# Patient Record
Sex: Female | Born: 1950 | Race: White | Hispanic: No | Marital: Married | Smoking: Former smoker
Health system: Southern US, Community
[De-identification: ages and names within clinical notes are randomized; demographics above are authoritative.]

## PROBLEM LIST (undated history)

## (undated) DIAGNOSIS — E785 Hyperlipidemia, unspecified: Secondary | ICD-10-CM

## (undated) DIAGNOSIS — K219 Gastro-esophageal reflux disease without esophagitis: Secondary | ICD-10-CM

## (undated) DIAGNOSIS — I10 Essential (primary) hypertension: Secondary | ICD-10-CM

## (undated) DIAGNOSIS — E119 Type 2 diabetes mellitus without complications: Secondary | ICD-10-CM

## (undated) DIAGNOSIS — M109 Gout, unspecified: Secondary | ICD-10-CM

## (undated) HISTORY — DX: Type 2 diabetes mellitus without complications: E11.9

---

## 2015-01-22 DIAGNOSIS — M1990 Unspecified osteoarthritis, unspecified site: Secondary | ICD-10-CM | POA: Insufficient documentation

## 2015-01-22 DIAGNOSIS — Z8739 Personal history of other diseases of the musculoskeletal system and connective tissue: Secondary | ICD-10-CM | POA: Insufficient documentation

## 2015-01-22 DIAGNOSIS — J4520 Mild intermittent asthma, uncomplicated: Secondary | ICD-10-CM | POA: Insufficient documentation

## 2015-02-26 DIAGNOSIS — D729 Disorder of white blood cells, unspecified: Secondary | ICD-10-CM | POA: Insufficient documentation

## 2015-12-16 ENCOUNTER — Encounter: Payer: Self-pay | Admitting: Internal Medicine

## 2015-12-16 ENCOUNTER — Ambulatory Visit (INDEPENDENT_AMBULATORY_CARE_PROVIDER_SITE_OTHER): Payer: 59 | Admitting: Internal Medicine

## 2015-12-16 ENCOUNTER — Other Ambulatory Visit (INDEPENDENT_AMBULATORY_CARE_PROVIDER_SITE_OTHER): Payer: 59

## 2015-12-16 ENCOUNTER — Encounter (INDEPENDENT_AMBULATORY_CARE_PROVIDER_SITE_OTHER): Payer: Self-pay

## 2015-12-16 VITALS — BP 110/76 | HR 78 | Wt 171.0 lb

## 2015-12-16 DIAGNOSIS — R05 Cough: Secondary | ICD-10-CM

## 2015-12-16 DIAGNOSIS — R059 Cough, unspecified: Secondary | ICD-10-CM

## 2015-12-16 DIAGNOSIS — R058 Other specified cough: Secondary | ICD-10-CM | POA: Insufficient documentation

## 2015-12-16 LAB — CBC WITH DIFFERENTIAL/PLATELET
Basophils Absolute: 0 K/uL (ref 0.0–0.1)
Basophils Relative: 0.3 % (ref 0.0–3.0)
Eosinophils Absolute: 0.3 K/uL (ref 0.0–0.7)
Eosinophils Relative: 1.7 % (ref 0.0–5.0)
HCT: 36 % (ref 36.0–46.0)
Hemoglobin: 12 g/dL (ref 12.0–15.0)
Lymphocytes Relative: 16.6 % (ref 12.0–46.0)
Lymphs Abs: 2.9 K/uL (ref 0.7–4.0)
MCHC: 33.4 g/dL (ref 30.0–36.0)
MCV: 89.2 fl (ref 78.0–100.0)
Monocytes Absolute: 1 K/uL (ref 0.1–1.0)
Monocytes Relative: 5.6 % (ref 3.0–12.0)
Neutro Abs: 13.3 K/uL — ABNORMAL HIGH (ref 1.4–7.7)
Neutrophils Relative %: 75.8 % (ref 43.0–77.0)
Platelets: 404 K/uL — ABNORMAL HIGH (ref 150.0–400.0)
RBC: 4.03 Mil/uL (ref 3.87–5.11)
RDW: 14.2 % (ref 11.5–15.5)
WBC: 17.5 K/uL — ABNORMAL HIGH (ref 4.0–10.5)

## 2015-12-16 LAB — NITRIC OXIDE: Nitric Oxide: 5

## 2015-12-16 MED ORDER — GABAPENTIN 100 MG PO CAPS: 100.0000 mg | ORAL_CAPSULE | Freq: Three times a day (TID) | ORAL | 2 refills | Status: DC

## 2015-12-16 MED ORDER — OMEPRAZOLE 20 MG PO CPDR: DELAYED_RELEASE_CAPSULE | ORAL | Status: DC

## 2015-12-16 MED ORDER — PANTOPRAZOLE SODIUM 40 MG PO TBEC: 40.0000 mg | DELAYED_RELEASE_TABLET | Freq: Every day | ORAL | 2 refills | Status: DC

## 2015-12-16 NOTE — Patient Instructions (Addendum)
Pantoprazole (protonix) 40 mg   Take  30-60 min before first meal of the day and  prilosec 20 mg x 2 x 30 min before supper  Stop Breo  Gabapentin 100 mg three times daily   GERD (REFLUX)  is an extremely common cause of respiratory symptoms just like yours , many times with no obvious heartburn at all.    It can be treated with medication, but also with lifestyle changes including elevation of the head of your bed (ideally with 6 inch  bed blocks),  Smoking cessation, avoidance of late meals, excessive alcohol, and avoid fatty foods, chocolate, peppermint, colas, red wine, and acidic juices such as orange juice.  NO MINT OR MENTHOL PRODUCTS SO NO COUGH DROPS   USE SUGARLESS CANDY INSTEAD (Jolley ranchers or Stover's or Life Savers) or even ice chips will also do - the key is to swallow to prevent all throat clearing. NO OIL BASED VITAMINS - use powdered substitutes.    Please remember to go to the lab   department downstairs for your tests - we will call you with the results when they are available.  Please schedule a follow up office visit in 4 weeks, sooner if needed

## 2015-12-16 NOTE — Progress Notes (Signed)
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 CAP LL with radiographic clearance but very little clinical correlation.   12/16/2015 1st [facility] office visit/ Sheila [name]   Chief Complaint  Patient presents with  . Pulmonary Consult    Referred by Dr. Sherril Croon. Pt states she was dxed with Asthma 5 yrs ago. She c/o cough for the past several yrs. Cough is prod at times with clear to white sputum.  Cough is esp in the am. Sometimes it wakes her up at night. Cough is triggered by strong smells.   severe cough esp immediately p stirring in am but not like an alarm clock  ? Some better afternoon unless talking then much worse Neb works better then saba hfa  Prednisone did not help at all Assoc with some sneezing neg resp to allegra/clariton/ zytec but minimal sense of pnds  No obvious other patterns in day to day or daytime variabilty or assoc sob unless coughing or cp or chest tightness, subjective wheeze overt sinus or hb symptoms. No unusual exp hx or h/o childhood pna/ asthma or knowledge of premature birth.  Sleeping ok without nocturnal  or early am exacerbation  of respiratory  c/o's or need for noct saba. Also denies any obvious fluctuation of symptoms with weather or environmental changes or other aggravating or alleviating factors except as outlined above   Current Medications, Allergies, Complete Past Medical History, Past Surgical History, Family History, and Social History were  reviewed in [employer] record.               Review of Systems  Constitutional: Negative for chills, fever and unexpected weight change.  HENT: Negative for congestion, dental problem, ear pain, nosebleeds, postnasal drip, rhinorrhea, sinus pressure, sneezing, sore throat, trouble swallowing and voice change.   Eyes: Negative for visual disturbance.  Respiratory: Positive for cough and shortness of breath. Negative for choking.   Cardiovascular: Negative for chest pain and leg swelling.  Gastrointestinal: Negative for abdominal pain, diarrhea and vomiting.  Genitourinary: Negative for difficulty urinating.  Musculoskeletal: Positive for arthralgias.  Skin: Negative for rash.  Neurological: Negative for tremors, syncope and headaches.  Hematological: Does not bruise/bleed easily.       Objective:   Physical Exam  amb wf with severe voice fatigue/ harsh cough and incessant throat clearing   Wt Readings from Last 3 Encounters:  12/16/15 171 lb (77.6 kg)    Vital signs reviewed    HEENT: nl dentition, turbinates, and oropharynx. Nl external ear canals without cough reflex   NECK :  without JVD/Nodes/TM/ nl carotid upstrokes bilaterally   LUNGS: no acc muscle use,  Nl contour chest which is clear to A and P bilaterally without reproducible  cough on insp or exp maneuvers   CV:  RRR  no s3 or murmur or increase in P2, no edema  ABD:  soft and nontender with nl inspiratory excursion in the supine position. No bruits or organomegaly, bowel sounds nl  MS:  Nl gait/ ext warm without deformities, calf tenderness, cyanosis or clubbing No obvious joint restrictions   SKIN: warm and dry without lesions    NEURO:  alert, approp, nl sensorium with  no motor deficits     I personally reviewed images and agree with radiology impression as follows:  CXR:   11/28/15 No pna/ no acute abnormality       Assessment & Plan:

## 2015-12-17 LAB — RESPIRATORY ALLERGY PROFILE REGION II ~~LOC~~
Allergen, A. alternata, m6: <0.1 kU/L
Allergen, Comm Silver Birch, t9: <0.1 kU/L
Allergen, D pternoyssinus,d7: 0.1 kU/L — ABNORMAL HIGH
Allergen, Mulberry, t76: <0.1 kU/L
Allergen, Oak,t7: <0.1 kU/L
Box Elder IgE: <0.1 kU/L
Cat Dander: <0.1 kU/L
Cockroach: 0.16 kU/L — ABNORMAL HIGH
D. farinae: <0.1 kU/L
Dog Dander: <0.1 kU/L
Elm IgE: <0.1 kU/L
IgE (Immunoglobulin E), Serum: 97 kU/L (ref <115)
Johnson Grass: <0.1 kU/L
Rough Pigweed  IgE: <0.1 kU/L
Sheep Sorrel IgE: <0.1 kU/L

## 2015-12-17 NOTE — Assessment & Plan Note (Addendum)
FENO 12/16/2015  =   5 - Spirometry 12/16/2015  wnl including fef 25-75 with active symptoms  - Allergy profile 12/16/2015 >  Eos 0.3 /  IgE  Pending    The most common causes of chronic cough in immunocompetent adults include the following: upper airway cough syndrome (UACS), previously referred to as postnasal drip syndrome (PNDS), which is caused by variety of rhinosinus conditions; (2) asthma; (3) GERD; (4) chronic bronchitis from cigarette smoking or other inhaled environmental irritants; (5) nonasthmatic eosinophilic bronchitis; and (6) bronchiectasis.   These conditions, singly or in combination, have accounted for up to 94% of the causes of chronic cough in prospective studies.   Other conditions have constituted no >6% of the causes in prospective studies These have included bronchogenic carcinoma, chronic interstitial pneumonia, sarcoidosis, left ventricular failure, ACEI-induced cough, and aspiration from a condition associated with pharyngeal dysfunction.    Chronic cough is often simultaneously caused by more than one condition. A single cause has been found from 38 to 82% of the time, multiple causes from 18 to 62%. Multiply caused cough has been the result of three diseases up to 42% of the time.       Based on hx and exam, this is most likely:  Probably not asthma at present but rather  Upper airway cough syndrome , so named because it's frequently impossible to sort out how much is  CR/sinusitis with freq throat clearing (which can be related to primary GERD)   vs  causing  secondary (" extra esophageal")  GERD from wide swings in gastric pressure that occur with throat clearing, often  promoting self use of mint and menthol lozenges that reduce the lower esophageal sphincter tone and exacerbate the problem further in a cyclical fashion.   These are the same pts (now being labeled as having "irritable larynx syndrome" by some cough centers) who not infrequently have a history of having  failed to tolerate ace inhibitors,  dry powder inhalers or biphosphonates or report having atypical reflux symptoms that don't respond to standard doses of PPI , and are easily confused as having aecopd or asthma flares by even experienced allergists/ pulmonologists.   The first step is to maximize acid suppression and eliminate cyclical coughing with gabapentin then regroup in 4 weeks  I had an extended discussion with the patient/husband reviewing all relevant studies completed to date and  lasting 35 min/60 min ov  1) Explained: The standardized cough guidelines published in Chest by [place] in 2006 are still the best available and consist of a multiple step process (up to 12!) , not a single office visit,  and are intended  to address this problem logically,  with an alogrithm dependent on response to empiric treatment at  each progressive step  to determine a specific diagnosis with  minimal addtional testing needed. Therefore if adherence is an issue or can't be accurately verified,  it's very unlikely the standard evaluation and treatment will be successful here.    Furthermore, response to therapy (other than acute cough suppression, which should only be used short term with avoidance of narcotic containing cough syrups if possible), can be a gradual process for which the patient may perceive immediate benefit.  Unlike going to an eye doctor where the best perscription is almost always the first one and is immediately effective, this is almost never the case in the management of chronic cough syndromes. Therefore the patient needs to commit up front to consistently adhere to  recommendations  for up to 6 weeks of therapy directed at the likely underlying problem(s) before the response can be reasonably evaluated.     2) Each maintenance medication was reviewed in detail including most importantly the difference between maintenance and prns and under what circumstances the prns are to be  triggered using an action plan format that is not reflected in the computer generated alphabetically organized AVS.    Please see instructions for details which were reviewed in writing and the patient given a copy highlighting the part that I personally wrote and discussed at today's ov.   See instructions for specific recommendations which were reviewed directly with the patient who was given a copy with highlighter outlining the key components.

## 2016-01-16 ENCOUNTER — Ambulatory Visit: Payer: 59 | Admitting: Internal Medicine

## 2016-02-02 ENCOUNTER — Ambulatory Visit (INDEPENDENT_AMBULATORY_CARE_PROVIDER_SITE_OTHER): Payer: 59 | Admitting: Internal Medicine

## 2016-02-02 ENCOUNTER — Encounter: Payer: Self-pay | Admitting: Internal Medicine

## 2016-02-02 VITALS — BP 116/80 | HR 63 | Ht 60.0 in | Wt 172.6 lb

## 2016-02-02 DIAGNOSIS — R05 Cough: Secondary | ICD-10-CM

## 2016-02-02 DIAGNOSIS — R058 Other specified cough: Secondary | ICD-10-CM

## 2016-02-02 NOTE — Progress Notes (Signed)
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 CAP LL with radiographic clearance but very little clinical correlation.    History of Present Illness  12/16/2015 1st [facility] office visit/ [facility]  Chief Complaint  Patient presents with  . Pulmonary Consult    Referred by Dr. Sherril Croon. Pt states she was dxed with Asthma 5 yrs ago. She c/o cough for the past several yrs. Cough is prod at times with clear to white sputum.  Cough is esp in the am. Sometimes it wakes her up at night. Cough is triggered by strong smells.   severe cough esp immediately p stirring in am but not like an alarm clock  ? Some better afternoon unless talking then much worse Neb works better than saba hfa  Prednisone did not help at all Assoc with some sneezing neg resp to allegra/clariton/ zytec but minimal sense of pnds rec Pantoprazole (protonix) 40 mg   Take  30-60 min before first meal of the day and  prilosec 20 mg x 2 x 30 min before supper Stop Breo Gabapentin 100 mg three times daily  GERD diet     02/02/2016  f/u ov/Samad Thon re: recurrent cough x singulair and max for Reflux / neurontin 100 mg tid / no need for saba  Chief Complaint  Patient presents with  . Follow-up    Pt. still has a slight dry cough, Pt. denies wheezing,chest pain,sob  hard rock candy helping  / back to work but hasn't been exposed to any of the typical triggers yet  Not limited by  breathing from desired activities    No obvious day to day or daytime variability or assoc excess/ purulent sputum or mucus plugs or hemoptysis or cp or chest tightness, subjective wheeze or overt sinus or hb symptoms. No unusual exp hx or h/o childhood pna/ asthma or knowledge of premature birth.  Sleeping ok without nocturnal  or early am exacerbation  of respiratory  c/o's or need for noct saba. Also denies any obvious fluctuation of symptoms with weather or environmental changes or other aggravating or alleviating factors except as outlined above   Current Medications, Allergies, Complete Past Medical History, Past Surgical History, Family History, and Social History were reviewed in [employer] record.  ROS  The following are not active complaints unless bolded sore throat, dysphagia, dental problems, itching, sneezing,  nasal congestion or excess/ purulent secretions, ear ache,   fever, chills, sweats, unintended wt loss, classically pleuritic or exertional cp,  orthopnea pnd or leg swelling, presyncope, palpitations, abdominal pain, anorexia, nausea, vomiting, diarrhea  or change in bowel or bladder habits, change in stools or urine, dysuria,hematuria,  rash, arthralgias, visual complaints, headache, numbness, weakness or ataxia or problems with walking or coordination,  change in mood/affect or memory.  Objective:   Physical Exam  amb wf  nad     Wt Readings from Last 3 Encounters:  02/02/16 172 lb 9.6 oz (78.3 kg)  12/16/15 171 lb (77.6 kg)    Vital signs reviewed     HEENT: nl dentition, turbinates, and oropharynx. Nl external ear canals without cough reflex   NECK :  without JVD/Nodes/TM/ nl carotid upstrokes bilaterally   LUNGS: no acc muscle use,  Nl contour chest which is clear to A and P bilaterally without reproducible  cough on insp or exp maneuvers   CV:  RRR  no s3 or murmur or increase in P2, no edema   ABD:  soft  and nontender with nl inspiratory excursion in the supine position. No bruits or organomegaly, bowel sounds nl  MS:  Nl gait/ ext warm without deformities, calf tenderness, cyanosis or clubbing No obvious joint restrictions   SKIN: warm and dry without lesions    NEURO:  alert, approp, nl sensorium with  no motor deficits     I personally reviewed images and agree with radiology impression as follows:  CXR:   11/28/15 No pna/ no acute abnormality       Assessment & Plan:

## 2016-02-02 NOTE — Patient Instructions (Addendum)
If cough flares, increase gabapentin to 300 mg three times daily.  For itching/sneezing drainage / throat tickle try take CHLORPHENIRAMINE  4 mg - take one every 4 hours as needed - available over the counter- may cause drowsiness so start with just a bedtime dose or two and see how you tolerate it before trying in daytime    Please schedule a follow up visit in 2 months but call sooner if needed

## 2016-02-09 NOTE — Assessment & Plan Note (Addendum)
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     For now reasonable to continue singulair/ gabapentin combination (the former certainly can't make cough worse, whereas BREO/ dpi's can potentially worsen cough)  for the next 2 months and see if can tolerate work exposures.  Methacholine challenge remains the next option if flares again.  In interim can use 1st gen h1 prn and increase the gabapentin to max of 300 tid if needed    I had an extended discussion with the patient/husband reviewing all relevant studies completed to date and  lasting 15 to 20 minutes of a 25 minute visit    Each maintenance medication was reviewed in detail including most importantly the difference between maintenance and prns and under what circumstances the prns are to be triggered using an action plan format that is not reflected in the computer generated alphabetically organized AVS.    Please see instructions for details which were reviewed in writing and the patient given a copy highlighting the part that I personally wrote and discussed at today's ov.   fo

## 2016-04-02 ENCOUNTER — Ambulatory Visit: Payer: 59 | Admitting: Internal Medicine

## 2017-06-06 DIAGNOSIS — M25579 Pain in unspecified ankle and joints of unspecified foot: Secondary | ICD-10-CM | POA: Insufficient documentation

## 2018-04-26 DIAGNOSIS — M7989 Other specified soft tissue disorders: Secondary | ICD-10-CM | POA: Diagnosis not present

## 2018-04-26 DIAGNOSIS — Z1331 Encounter for screening for depression: Secondary | ICD-10-CM | POA: Diagnosis not present

## 2018-04-26 DIAGNOSIS — Z1339 Encounter for screening examination for other mental health and behavioral disorders: Secondary | ICD-10-CM | POA: Diagnosis not present

## 2018-04-26 DIAGNOSIS — Z79899 Other long term (current) drug therapy: Secondary | ICD-10-CM | POA: Diagnosis not present

## 2018-04-26 DIAGNOSIS — Z1211 Encounter for screening for malignant neoplasm of colon: Secondary | ICD-10-CM | POA: Diagnosis not present

## 2018-04-26 DIAGNOSIS — I10 Essential (primary) hypertension: Secondary | ICD-10-CM | POA: Diagnosis not present

## 2018-04-26 DIAGNOSIS — R5383 Other fatigue: Secondary | ICD-10-CM | POA: Diagnosis not present

## 2018-04-26 DIAGNOSIS — Z0000 Encounter for general adult medical examination without abnormal findings: Secondary | ICD-10-CM | POA: Diagnosis not present

## 2018-04-26 DIAGNOSIS — Z6836 Body mass index (BMI) 36.0-36.9, adult: Secondary | ICD-10-CM | POA: Diagnosis not present

## 2018-04-26 DIAGNOSIS — E1165 Type 2 diabetes mellitus with hyperglycemia: Secondary | ICD-10-CM | POA: Diagnosis not present

## 2018-04-26 DIAGNOSIS — Z7189 Other specified counseling: Secondary | ICD-10-CM | POA: Diagnosis not present

## 2018-04-26 DIAGNOSIS — E7800 Pure hypercholesterolemia, unspecified: Secondary | ICD-10-CM | POA: Diagnosis not present

## 2018-04-26 DIAGNOSIS — Z299 Encounter for prophylactic measures, unspecified: Secondary | ICD-10-CM | POA: Diagnosis not present

## 2018-05-03 ENCOUNTER — Ambulatory Visit (INDEPENDENT_AMBULATORY_CARE_PROVIDER_SITE_OTHER): Payer: Medicare Other

## 2018-05-03 ENCOUNTER — Ambulatory Visit (INDEPENDENT_AMBULATORY_CARE_PROVIDER_SITE_OTHER): Payer: Medicare Other | Admitting: Podiatry

## 2018-05-03 ENCOUNTER — Encounter: Payer: Self-pay | Admitting: Podiatry

## 2018-05-03 VITALS — BP 154/89 | HR 84

## 2018-05-03 DIAGNOSIS — M19079 Primary osteoarthritis, unspecified ankle and foot: Secondary | ICD-10-CM

## 2018-05-03 DIAGNOSIS — M19071 Primary osteoarthritis, right ankle and foot: Secondary | ICD-10-CM | POA: Diagnosis not present

## 2018-05-03 DIAGNOSIS — M19072 Primary osteoarthritis, left ankle and foot: Secondary | ICD-10-CM | POA: Diagnosis not present

## 2018-05-03 NOTE — Progress Notes (Signed)
This patient presents to the office with chief complaint of painful feet due to previously diagnosed arthritis.  She says she has been seen by Dr. Victorino Dike who has attempted to treat her arthritis in the past.  He has treated her with medication and has taken multiple has taken multiple x-rays.  She states that he explained to her that her bones in her rear foot especially her right rear foot have moved.  This has caused her to have significant pain and swelling especially in her right foot by the end of the day.  She relates  having ligament surgery as a child to both feet.  She states her severe pain has been present over the last 5 years.  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  She says Dr.  Victorino Dike told her he was unable to provide her any relief and discharged her.  She now presents to the office for a second opinion on possible treatment for her painful feet.  This patient is also diabetic.    Vascular  Dorsalis pedis and posterior tibial pulses are palpable  B/L.  Capillary return  WNL.  Temperature gradient is  WNL.  Skin turgor  WNL  Sensorium  Senn Weinstein monofilament wire  WNL. Normal tactile sensation.  Nail Exam  Patient has normal nails with no evidence of bacterial or fungal infection.  Orthopedic  Exam  Examination of her left foot reveals limited ROM  STJ.  No swelling noted.  HAV 1st MPJ  B/L.  Pes planus foot type. Examination of right foot reveals no motion  STJ right foot.  HAV 1st MPJ right foot.  Severe pes planus foot type.  Skin  No open lesions.  Normal skin texture and turgor. Callus noted under 1st MCJ and 1st MPJ right foot.  Chronic arthropathy  Feet  B/L.  IE.  Xrays left foot reveal severe HAV deformity with separation of bones 1st MCJ.  There is calcification at the insertion achilles tendon left foot.  Midfoot arthritic changes left foot. Right foot X-ray reveals severe HAV deformity with plantarflexed talus right  rearfoot.  Osteophytes noted over talus dorsally.  The bone at the head first metatarsal has become arthritic.  STJ right foot is arthritic with osteophyes posteriorally.  Discussed this condition and x-rays with this patient.  I told her that she needs to be seen by Dr. Logan Bores for further work-up of her painful foot condition.  Discussed she may need bracing and/or surgery and Dr. Logan Bores will evaluate her and make that decision.  Patient states the Voltaren provided no relief when she took the Voltaren.  Therefore we decided against treating her with any medication.  Patient is to make an appointment with Dr. Logan Bores for further evaluation and treatment.  Helane Gunther DPM

## 2018-05-08 ENCOUNTER — Ambulatory Visit (INDEPENDENT_AMBULATORY_CARE_PROVIDER_SITE_OTHER): Payer: Medicare Other | Admitting: Podiatry

## 2018-05-08 DIAGNOSIS — M76829 Posterior tibial tendinitis, unspecified leg: Secondary | ICD-10-CM

## 2018-05-08 DIAGNOSIS — M19071 Primary osteoarthritis, right ankle and foot: Secondary | ICD-10-CM

## 2018-05-08 DIAGNOSIS — M2140 Flat foot [pes planus] (acquired), unspecified foot: Secondary | ICD-10-CM | POA: Diagnosis not present

## 2018-05-08 MED ORDER — CELECOXIB 100 MG PO CAPS: 100.0000 mg | ORAL_CAPSULE | Freq: Two times a day (BID) | ORAL | 2 refills | Status: DC

## 2018-05-08 NOTE — Progress Notes (Addendum)
   HPI: 68 year old female PMHx of T2DM presents the office today for evaluation of painful right foot.  Patient was last seen on 05/03/2018 at which time she was referred to me for additional work-up and evaluation.  Patient complains of right foot arch collapse that is been very painful for several years now.  Patient is a type II diabetic.  Her last hemoglobin A1c 7.2 mg/dL.  Patient states that conservative modalities have not helped to alleviate any of her symptoms.  Patient just recently retired this year and is not on her feet as much.  Rest does help to alleviate symptoms.  She presents for further treatment evaluation  Past Medical History:  Diagnosis Date  . DM (diabetes mellitus) (HCC)      Physical Exam: General: The patient is alert and oriented x3 in no acute distress.  Dermatology: Skin is warm, dry and supple bilateral lower extremities. Negative for open lesions or macerations.  Vascular: Palpable pedal pulses bilaterally. No edema or erythema noted. Capillary refill within normal limits.  Neurological: Epicritic and protective threshold diminished bilaterally.   Musculoskeletal Exam: Range of motion within normal limits to all pedal and ankle joints bilateral. Muscle strength 5/5 in all groups bilateral.  Significant end-stage posterior tibial tendon dysfunction with loss of motion to the subtalar joint, TN joint noted.  Degenerative arthritic changes also noted.  End-stage pes planus right lower extremity noted  Radiographic Exam:  Joint space narrowing with collapse of the medial longitudinal arch noted.  Exposure of the talar head noted with medial deviation of the talar head.  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  Assessment: 1.  Advanced PTTD with arthritic degenerative changes 2.  Pes planus right lower extremity 3.  Pain in right foot 4.  T2DM  Plan of Care:  1. Patient evaluated. X-Rays reviewed.  2.  Today we discussed surgical versus  conservative management regarding the patient's symptoms.  Surgery would consist of triple arthrodesis right foot.  I explained the benefits and disadvantages and postoperative recovery regarding surgery.  Patient would like to pursue conservative management for now. 3.  Prescription for Celebrex 200 mg twice daily 4.  Appointment with [facility] for diabetic shoes and insoles.  Patient does have a [place] brace that she is attempted to use in the past however it has not been very successful.  She is going to bring that to [facility] to see if it can be modified 5.  Return to clinic in 3 months      Felecia Shelling, DPM [facility]  Dr. Felecia Shelling, DPM    [address] [place], [place] [address]                Office [phone]  Fax [phone]

## 2018-05-09 DIAGNOSIS — Z23 Encounter for immunization: Secondary | ICD-10-CM | POA: Diagnosis not present

## 2018-05-11 DIAGNOSIS — Z1231 Encounter for screening mammogram for malignant neoplasm of breast: Secondary | ICD-10-CM | POA: Diagnosis not present

## 2018-05-15 ENCOUNTER — Ambulatory Visit: Payer: Medicare Other | Admitting: Orthotics

## 2018-05-15 DIAGNOSIS — M2140 Flat foot [pes planus] (acquired), unspecified foot: Secondary | ICD-10-CM

## 2018-05-15 DIAGNOSIS — M76829 Posterior tibial tendinitis, unspecified leg: Secondary | ICD-10-CM

## 2018-05-17 ENCOUNTER — Ambulatory Visit: Payer: Medicare Other | Admitting: Orthotics

## 2018-05-17 DIAGNOSIS — M76829 Posterior tibial tendinitis, unspecified leg: Secondary | ICD-10-CM

## 2018-05-17 DIAGNOSIS — M2140 Flat foot [pes planus] (acquired), unspecified foot: Secondary | ICD-10-CM

## 2018-05-17 DIAGNOSIS — M19071 Primary osteoarthritis, right ankle and foot: Secondary | ICD-10-CM

## 2018-05-17 NOTE — Progress Notes (Signed)
Patient presents today for evaluation/casting for AFO brace (r).   Patient has hx of the following conditions: Gait instability,  Ankle instabilty,  Gait analysis done and patient displays abnormality of gait in both sagittial and frontal planes, and could benefit in aggressive ankle support.  Patient chose [place] brace w/ lace/speed laces.  

## 2018-05-19 DIAGNOSIS — R5383 Other fatigue: Secondary | ICD-10-CM | POA: Diagnosis not present

## 2018-06-06 ENCOUNTER — Ambulatory Visit (INDEPENDENT_AMBULATORY_CARE_PROVIDER_SITE_OTHER): Payer: Medicare Other | Admitting: Orthotics

## 2018-06-06 DIAGNOSIS — M19071 Primary osteoarthritis, right ankle and foot: Secondary | ICD-10-CM | POA: Diagnosis not present

## 2018-06-06 DIAGNOSIS — E119 Type 2 diabetes mellitus without complications: Secondary | ICD-10-CM

## 2018-06-06 DIAGNOSIS — M76822 Posterior tibial tendinitis, left leg: Secondary | ICD-10-CM

## 2018-06-06 DIAGNOSIS — M76829 Posterior tibial tendinitis, unspecified leg: Secondary | ICD-10-CM

## 2018-06-06 DIAGNOSIS — M2142 Flat foot [pes planus] (acquired), left foot: Secondary | ICD-10-CM | POA: Diagnosis not present

## 2018-06-06 DIAGNOSIS — M2140 Flat foot [pes planus] (acquired), unspecified foot: Secondary | ICD-10-CM

## 2018-06-06 NOTE — Progress Notes (Signed)
Patient came in today to pick up standard Afo brace.  Patient was evaluated for fit and function.   The brace fit very well and there were any complaints of the way it felt once donned.  The brace offered ankle stability in both saggital and coroneal planes.  Patient advised to always wear proper fitting shoes with brace.  Patient also picked up DBS and OTS inserts; great fit.

## 2018-06-13 DIAGNOSIS — E8940 Asymptomatic postprocedural ovarian failure: Secondary | ICD-10-CM | POA: Diagnosis not present

## 2018-07-10 NOTE — Progress Notes (Signed)

## 2018-07-19 DIAGNOSIS — Z299 Encounter for prophylactic measures, unspecified: Secondary | ICD-10-CM | POA: Diagnosis not present

## 2018-07-19 DIAGNOSIS — M25511 Pain in right shoulder: Secondary | ICD-10-CM | POA: Diagnosis not present

## 2018-07-19 DIAGNOSIS — J069 Acute upper respiratory infection, unspecified: Secondary | ICD-10-CM | POA: Diagnosis not present

## 2018-07-19 DIAGNOSIS — Z789 Other specified health status: Secondary | ICD-10-CM | POA: Diagnosis not present

## 2018-08-02 DIAGNOSIS — E1165 Type 2 diabetes mellitus with hyperglycemia: Secondary | ICD-10-CM | POA: Diagnosis not present

## 2018-08-02 DIAGNOSIS — F321 Major depressive disorder, single episode, moderate: Secondary | ICD-10-CM | POA: Diagnosis not present

## 2018-08-02 DIAGNOSIS — Z299 Encounter for prophylactic measures, unspecified: Secondary | ICD-10-CM | POA: Diagnosis not present

## 2018-08-02 DIAGNOSIS — I10 Essential (primary) hypertension: Secondary | ICD-10-CM | POA: Diagnosis not present

## 2018-08-02 DIAGNOSIS — M25511 Pain in right shoulder: Secondary | ICD-10-CM | POA: Diagnosis not present

## 2018-08-02 DIAGNOSIS — Z6836 Body mass index (BMI) 36.0-36.9, adult: Secondary | ICD-10-CM | POA: Diagnosis not present

## 2018-08-07 ENCOUNTER — Ambulatory Visit: Payer: Medicare Other | Admitting: Podiatry

## 2018-08-16 DIAGNOSIS — I10 Essential (primary) hypertension: Secondary | ICD-10-CM | POA: Diagnosis not present

## 2018-08-18 DIAGNOSIS — Z299 Encounter for prophylactic measures, unspecified: Secondary | ICD-10-CM | POA: Diagnosis not present

## 2018-08-18 DIAGNOSIS — F321 Major depressive disorder, single episode, moderate: Secondary | ICD-10-CM | POA: Diagnosis not present

## 2018-08-18 DIAGNOSIS — I10 Essential (primary) hypertension: Secondary | ICD-10-CM | POA: Diagnosis not present

## 2018-08-18 DIAGNOSIS — R197 Diarrhea, unspecified: Secondary | ICD-10-CM | POA: Diagnosis not present

## 2018-09-06 ENCOUNTER — Ambulatory Visit: Payer: Medicare Other | Admitting: Podiatry

## 2018-09-07 DIAGNOSIS — I10 Essential (primary) hypertension: Secondary | ICD-10-CM | POA: Diagnosis not present

## 2018-09-14 DIAGNOSIS — E119 Type 2 diabetes mellitus without complications: Secondary | ICD-10-CM | POA: Diagnosis not present

## 2018-09-14 DIAGNOSIS — I10 Essential (primary) hypertension: Secondary | ICD-10-CM | POA: Diagnosis not present

## 2018-09-26 ENCOUNTER — Ambulatory Visit (INDEPENDENT_AMBULATORY_CARE_PROVIDER_SITE_OTHER): Payer: Medicare Other | Admitting: Podiatry

## 2018-09-26 ENCOUNTER — Other Ambulatory Visit: Payer: Self-pay

## 2018-09-26 ENCOUNTER — Encounter: Payer: Self-pay | Admitting: Podiatry

## 2018-09-26 VITALS — Temp 97.1°F

## 2018-09-26 DIAGNOSIS — L84 Corns and callosities: Secondary | ICD-10-CM | POA: Diagnosis not present

## 2018-09-26 DIAGNOSIS — E1142 Type 2 diabetes mellitus with diabetic polyneuropathy: Secondary | ICD-10-CM

## 2018-09-26 DIAGNOSIS — B351 Tinea unguium: Secondary | ICD-10-CM | POA: Diagnosis not present

## 2018-09-26 DIAGNOSIS — M79674 Pain in right toe(s): Secondary | ICD-10-CM | POA: Diagnosis not present

## 2018-09-26 DIAGNOSIS — M2140 Flat foot [pes planus] (acquired), unspecified foot: Secondary | ICD-10-CM

## 2018-09-26 DIAGNOSIS — M19079 Primary osteoarthritis, unspecified ankle and foot: Secondary | ICD-10-CM

## 2018-09-26 DIAGNOSIS — M79675 Pain in left toe(s): Secondary | ICD-10-CM | POA: Diagnosis not present

## 2018-09-26 NOTE — Progress Notes (Signed)
Complaint:  Visit Type: Patient returns to my office for continued preventative foot care services. Complaint: Patient states" my nails have grown long and thick and become painful to walk and wear shoes".   Patient has been diagnosed with DM with no foot complications. The patient presents for preventative foot care services. No changes to ROS  Podiatric Exam: Vascular: dorsalis pedis and posterior tibial pulses are palpable bilateral. Capillary return is immediate. Temperature gradient is WNL. Skin turgor WNL  Sensorium: Diminished  Semmes Weinstein monofilament test. Normal tactile sensation bilaterally. Nail Exam: Pt has thick disfigured discolored nails with subungual debris noted bilateral entire nail hallux through fifth toenails Ulcer Exam: There is no evidence of ulcer or pre-ulcerative changes or infection. Orthopedic Exam: Muscle tone and strength are WNL. No limitations in general ROM. No crepitus or effusions noted. Foot type and digits show no abnormalities. Bony prominences are unremarkable. Skin:  Porokeratosis.right foot. No infection or ulcers  Diagnosis:  Onychomycosis, , Pain in right toe, pain in left toes porokeratosis left foot.  Treatment & Plan Procedures and Treatment: Consent by patient was obtained for treatment procedures.   Debridement of mycotic and hypertrophic toenails, 1 through 5 bilateral and clearing of subungual debris. No ulceration, no infection noted.  Debride callus  Right foot. Return Visit-Office Procedure: Patient instructed to return to the office for a follow up visit 3 months for continued evaluation and treatment.    Gardiner Barefoot DPM

## 2018-10-03 DIAGNOSIS — I10 Essential (primary) hypertension: Secondary | ICD-10-CM | POA: Diagnosis not present

## 2018-10-09 DIAGNOSIS — E538 Deficiency of other specified B group vitamins: Secondary | ICD-10-CM | POA: Diagnosis not present

## 2018-10-09 DIAGNOSIS — R5383 Other fatigue: Secondary | ICD-10-CM | POA: Diagnosis not present

## 2018-10-09 DIAGNOSIS — Z6837 Body mass index (BMI) 37.0-37.9, adult: Secondary | ICD-10-CM | POA: Diagnosis not present

## 2018-10-09 DIAGNOSIS — I10 Essential (primary) hypertension: Secondary | ICD-10-CM | POA: Diagnosis not present

## 2018-10-09 DIAGNOSIS — Z299 Encounter for prophylactic measures, unspecified: Secondary | ICD-10-CM | POA: Diagnosis not present

## 2018-10-09 DIAGNOSIS — G4700 Insomnia, unspecified: Secondary | ICD-10-CM | POA: Diagnosis not present

## 2018-10-24 DIAGNOSIS — Z6838 Body mass index (BMI) 38.0-38.9, adult: Secondary | ICD-10-CM | POA: Diagnosis not present

## 2018-10-24 DIAGNOSIS — J4520 Mild intermittent asthma, uncomplicated: Secondary | ICD-10-CM | POA: Diagnosis not present

## 2018-10-24 DIAGNOSIS — R609 Edema, unspecified: Secondary | ICD-10-CM | POA: Insufficient documentation

## 2018-10-24 DIAGNOSIS — M25579 Pain in unspecified ankle and joints of unspecified foot: Secondary | ICD-10-CM | POA: Diagnosis not present

## 2018-10-24 DIAGNOSIS — D729 Disorder of white blood cells, unspecified: Secondary | ICD-10-CM | POA: Diagnosis not present

## 2018-10-24 DIAGNOSIS — M1990 Unspecified osteoarthritis, unspecified site: Secondary | ICD-10-CM | POA: Diagnosis not present

## 2018-10-24 DIAGNOSIS — R05 Cough: Secondary | ICD-10-CM | POA: Diagnosis not present

## 2018-10-24 DIAGNOSIS — Z8739 Personal history of other diseases of the musculoskeletal system and connective tissue: Secondary | ICD-10-CM | POA: Diagnosis not present

## 2018-11-03 DIAGNOSIS — I10 Essential (primary) hypertension: Secondary | ICD-10-CM | POA: Diagnosis not present

## 2018-11-08 DIAGNOSIS — M1990 Unspecified osteoarthritis, unspecified site: Secondary | ICD-10-CM | POA: Diagnosis not present

## 2018-11-08 DIAGNOSIS — M1039 Gout due to renal impairment, multiple sites: Secondary | ICD-10-CM | POA: Diagnosis not present

## 2018-11-08 DIAGNOSIS — D729 Disorder of white blood cells, unspecified: Secondary | ICD-10-CM | POA: Diagnosis not present

## 2018-11-08 DIAGNOSIS — E1122 Type 2 diabetes mellitus with diabetic chronic kidney disease: Secondary | ICD-10-CM | POA: Diagnosis not present

## 2018-11-08 DIAGNOSIS — M79641 Pain in right hand: Secondary | ICD-10-CM | POA: Diagnosis not present

## 2018-11-08 DIAGNOSIS — Z6838 Body mass index (BMI) 38.0-38.9, adult: Secondary | ICD-10-CM | POA: Diagnosis not present

## 2018-11-08 DIAGNOSIS — E1165 Type 2 diabetes mellitus with hyperglycemia: Secondary | ICD-10-CM | POA: Diagnosis not present

## 2018-11-08 DIAGNOSIS — I10 Essential (primary) hypertension: Secondary | ICD-10-CM | POA: Diagnosis not present

## 2018-11-08 DIAGNOSIS — M109 Gout, unspecified: Secondary | ICD-10-CM | POA: Insufficient documentation

## 2018-11-08 DIAGNOSIS — Z299 Encounter for prophylactic measures, unspecified: Secondary | ICD-10-CM | POA: Diagnosis not present

## 2018-11-08 DIAGNOSIS — Z6837 Body mass index (BMI) 37.0-37.9, adult: Secondary | ICD-10-CM | POA: Diagnosis not present

## 2018-11-10 DIAGNOSIS — E119 Type 2 diabetes mellitus without complications: Secondary | ICD-10-CM | POA: Diagnosis not present

## 2018-11-10 DIAGNOSIS — I10 Essential (primary) hypertension: Secondary | ICD-10-CM | POA: Diagnosis not present

## 2018-12-08 DIAGNOSIS — I10 Essential (primary) hypertension: Secondary | ICD-10-CM | POA: Diagnosis not present

## 2018-12-08 DIAGNOSIS — E119 Type 2 diabetes mellitus without complications: Secondary | ICD-10-CM | POA: Diagnosis not present

## 2018-12-15 DIAGNOSIS — I10 Essential (primary) hypertension: Secondary | ICD-10-CM | POA: Diagnosis not present

## 2018-12-26 DIAGNOSIS — D729 Disorder of white blood cells, unspecified: Secondary | ICD-10-CM | POA: Diagnosis not present

## 2018-12-28 DIAGNOSIS — Z8739 Personal history of other diseases of the musculoskeletal system and connective tissue: Secondary | ICD-10-CM | POA: Diagnosis not present

## 2018-12-28 DIAGNOSIS — M1039 Gout due to renal impairment, multiple sites: Secondary | ICD-10-CM | POA: Diagnosis not present

## 2018-12-28 DIAGNOSIS — D729 Disorder of white blood cells, unspecified: Secondary | ICD-10-CM | POA: Diagnosis not present

## 2019-01-02 ENCOUNTER — Other Ambulatory Visit: Payer: Self-pay

## 2019-01-02 ENCOUNTER — Ambulatory Visit (INDEPENDENT_AMBULATORY_CARE_PROVIDER_SITE_OTHER): Payer: Medicare Other | Admitting: Podiatry

## 2019-01-02 ENCOUNTER — Encounter: Payer: Self-pay | Admitting: Podiatry

## 2019-01-02 DIAGNOSIS — B351 Tinea unguium: Secondary | ICD-10-CM

## 2019-01-02 DIAGNOSIS — M79674 Pain in right toe(s): Secondary | ICD-10-CM

## 2019-01-02 DIAGNOSIS — M76829 Posterior tibial tendinitis, unspecified leg: Secondary | ICD-10-CM | POA: Diagnosis not present

## 2019-01-02 DIAGNOSIS — L84 Corns and callosities: Secondary | ICD-10-CM

## 2019-01-02 DIAGNOSIS — M19071 Primary osteoarthritis, right ankle and foot: Secondary | ICD-10-CM

## 2019-01-02 DIAGNOSIS — M79675 Pain in left toe(s): Secondary | ICD-10-CM

## 2019-01-02 NOTE — Progress Notes (Signed)
Complaint:  Visit Type: Patient returns to my office for continued preventative foot care services. Complaint: Patient states" my nails have grown long and thick and become painful to walk and wear shoes".   Patient has been diagnosed with DM with no foot complications. The patient presents for preventative foot care services. No changes to ROS  Podiatric Exam: Vascular: dorsalis pedis and posterior tibial pulses are palpable bilateral. Capillary return is immediate. Temperature gradient is WNL. Skin turgor WNL  Sensorium: Diminished  Semmes Weinstein monofilament test. Normal tactile sensation bilaterally. Nail Exam: Pt has thick disfigured discolored nails with subungual debris noted bilateral entire nail hallux through fifth toenails Ulcer Exam: There is no evidence of ulcer or pre-ulcerative changes or infection. Orthopedic Exam: Muscle tone and strength are WNL. No limitations in general ROM. No crepitus or effusions noted. Foot type and digits show no abnormalities. PTTD right foot.  Bony prominence rearfoot medially. Skin:  Porokeratosis.right foot. No infection or ulcers  Diagnosis:  Onychomycosis, , Pain in right toe, pain in left toes porokeratosis right  foot.  Treatment & Plan Procedures and Treatment: Consent by patient was obtained for treatment procedures.   Debridement of mycotic and hypertrophic toenails, 1 through 5 bilateral and clearing of subungual debris. No ulceration, no infection noted.  Debride callus  Right foot. Return Visit-Office Procedure: Patient instructed to return to the office for a follow up visit 3 months for continued evaluation and treatment.    Gardiner Barefoot DPM

## 2019-01-17 DIAGNOSIS — I10 Essential (primary) hypertension: Secondary | ICD-10-CM | POA: Diagnosis not present

## 2019-01-24 DIAGNOSIS — Z299 Encounter for prophylactic measures, unspecified: Secondary | ICD-10-CM | POA: Diagnosis not present

## 2019-01-24 DIAGNOSIS — E1122 Type 2 diabetes mellitus with diabetic chronic kidney disease: Secondary | ICD-10-CM | POA: Diagnosis not present

## 2019-01-24 DIAGNOSIS — J45909 Unspecified asthma, uncomplicated: Secondary | ICD-10-CM | POA: Diagnosis not present

## 2019-01-24 DIAGNOSIS — M25562 Pain in left knee: Secondary | ICD-10-CM | POA: Diagnosis not present

## 2019-01-24 DIAGNOSIS — Z6837 Body mass index (BMI) 37.0-37.9, adult: Secondary | ICD-10-CM | POA: Diagnosis not present

## 2019-01-24 DIAGNOSIS — I10 Essential (primary) hypertension: Secondary | ICD-10-CM | POA: Diagnosis not present

## 2019-01-29 DIAGNOSIS — E1122 Type 2 diabetes mellitus with diabetic chronic kidney disease: Secondary | ICD-10-CM | POA: Diagnosis not present

## 2019-01-29 DIAGNOSIS — Z299 Encounter for prophylactic measures, unspecified: Secondary | ICD-10-CM | POA: Diagnosis not present

## 2019-01-29 DIAGNOSIS — M25532 Pain in left wrist: Secondary | ICD-10-CM | POA: Diagnosis not present

## 2019-01-29 DIAGNOSIS — E1165 Type 2 diabetes mellitus with hyperglycemia: Secondary | ICD-10-CM | POA: Diagnosis not present

## 2019-01-29 DIAGNOSIS — Z6837 Body mass index (BMI) 37.0-37.9, adult: Secondary | ICD-10-CM | POA: Diagnosis not present

## 2019-01-29 DIAGNOSIS — I10 Essential (primary) hypertension: Secondary | ICD-10-CM | POA: Diagnosis not present

## 2019-01-30 DIAGNOSIS — S52592A Other fractures of lower end of left radius, initial encounter for closed fracture: Secondary | ICD-10-CM | POA: Diagnosis not present

## 2019-01-31 DIAGNOSIS — Z23 Encounter for immunization: Secondary | ICD-10-CM | POA: Diagnosis not present

## 2019-02-06 DIAGNOSIS — S52592D Other fractures of lower end of left radius, subsequent encounter for closed fracture with routine healing: Secondary | ICD-10-CM | POA: Diagnosis not present

## 2019-02-06 DIAGNOSIS — M1712 Unilateral primary osteoarthritis, left knee: Secondary | ICD-10-CM | POA: Diagnosis not present

## 2019-02-06 DIAGNOSIS — E1140 Type 2 diabetes mellitus with diabetic neuropathy, unspecified: Secondary | ICD-10-CM | POA: Diagnosis not present

## 2019-02-06 DIAGNOSIS — M79605 Pain in left leg: Secondary | ICD-10-CM | POA: Diagnosis not present

## 2019-02-06 DIAGNOSIS — M79604 Pain in right leg: Secondary | ICD-10-CM | POA: Diagnosis not present

## 2019-02-12 DIAGNOSIS — I10 Essential (primary) hypertension: Secondary | ICD-10-CM | POA: Diagnosis not present

## 2019-02-13 DIAGNOSIS — S52592D Other fractures of lower end of left radius, subsequent encounter for closed fracture with routine healing: Secondary | ICD-10-CM | POA: Diagnosis not present

## 2019-02-19 DIAGNOSIS — E1165 Type 2 diabetes mellitus with hyperglycemia: Secondary | ICD-10-CM | POA: Diagnosis not present

## 2019-02-19 DIAGNOSIS — E1122 Type 2 diabetes mellitus with diabetic chronic kidney disease: Secondary | ICD-10-CM | POA: Diagnosis not present

## 2019-02-19 DIAGNOSIS — Z299 Encounter for prophylactic measures, unspecified: Secondary | ICD-10-CM | POA: Diagnosis not present

## 2019-02-19 DIAGNOSIS — M25562 Pain in left knee: Secondary | ICD-10-CM | POA: Diagnosis not present

## 2019-02-19 DIAGNOSIS — I10 Essential (primary) hypertension: Secondary | ICD-10-CM | POA: Diagnosis not present

## 2019-02-19 DIAGNOSIS — F321 Major depressive disorder, single episode, moderate: Secondary | ICD-10-CM | POA: Diagnosis not present

## 2019-02-26 DIAGNOSIS — Z299 Encounter for prophylactic measures, unspecified: Secondary | ICD-10-CM | POA: Diagnosis not present

## 2019-02-26 DIAGNOSIS — R5383 Other fatigue: Secondary | ICD-10-CM | POA: Diagnosis not present

## 2019-02-26 DIAGNOSIS — M25511 Pain in right shoulder: Secondary | ICD-10-CM | POA: Diagnosis not present

## 2019-02-26 DIAGNOSIS — M79641 Pain in right hand: Secondary | ICD-10-CM | POA: Diagnosis not present

## 2019-02-26 DIAGNOSIS — E559 Vitamin D deficiency, unspecified: Secondary | ICD-10-CM | POA: Diagnosis not present

## 2019-02-26 DIAGNOSIS — I10 Essential (primary) hypertension: Secondary | ICD-10-CM | POA: Diagnosis not present

## 2019-02-26 DIAGNOSIS — Z6837 Body mass index (BMI) 37.0-37.9, adult: Secondary | ICD-10-CM | POA: Diagnosis not present

## 2019-02-27 DIAGNOSIS — S52592D Other fractures of lower end of left radius, subsequent encounter for closed fracture with routine healing: Secondary | ICD-10-CM | POA: Diagnosis not present

## 2019-02-28 DIAGNOSIS — E785 Hyperlipidemia, unspecified: Secondary | ICD-10-CM | POA: Diagnosis not present

## 2019-02-28 DIAGNOSIS — E1165 Type 2 diabetes mellitus with hyperglycemia: Secondary | ICD-10-CM | POA: Diagnosis not present

## 2019-02-28 DIAGNOSIS — Z299 Encounter for prophylactic measures, unspecified: Secondary | ICD-10-CM | POA: Diagnosis not present

## 2019-02-28 DIAGNOSIS — I10 Essential (primary) hypertension: Secondary | ICD-10-CM | POA: Diagnosis not present

## 2019-02-28 DIAGNOSIS — Z6837 Body mass index (BMI) 37.0-37.9, adult: Secondary | ICD-10-CM | POA: Diagnosis not present

## 2019-02-28 DIAGNOSIS — J45909 Unspecified asthma, uncomplicated: Secondary | ICD-10-CM | POA: Diagnosis not present

## 2019-03-12 DIAGNOSIS — Z299 Encounter for prophylactic measures, unspecified: Secondary | ICD-10-CM | POA: Diagnosis not present

## 2019-03-12 DIAGNOSIS — M25511 Pain in right shoulder: Secondary | ICD-10-CM | POA: Diagnosis not present

## 2019-03-12 DIAGNOSIS — M109 Gout, unspecified: Secondary | ICD-10-CM | POA: Diagnosis not present

## 2019-03-12 DIAGNOSIS — M25562 Pain in left knee: Secondary | ICD-10-CM | POA: Diagnosis not present

## 2019-03-12 DIAGNOSIS — Z6837 Body mass index (BMI) 37.0-37.9, adult: Secondary | ICD-10-CM | POA: Diagnosis not present

## 2019-03-12 DIAGNOSIS — M25512 Pain in left shoulder: Secondary | ICD-10-CM | POA: Diagnosis not present

## 2019-03-13 DIAGNOSIS — S52592D Other fractures of lower end of left radius, subsequent encounter for closed fracture with routine healing: Secondary | ICD-10-CM | POA: Diagnosis not present

## 2019-03-27 DIAGNOSIS — S52592D Other fractures of lower end of left radius, subsequent encounter for closed fracture with routine healing: Secondary | ICD-10-CM | POA: Diagnosis not present

## 2019-04-04 ENCOUNTER — Ambulatory Visit: Payer: Medicare Other | Admitting: Podiatry

## 2019-04-16 DIAGNOSIS — E119 Type 2 diabetes mellitus without complications: Secondary | ICD-10-CM | POA: Diagnosis not present

## 2019-04-18 DIAGNOSIS — E119 Type 2 diabetes mellitus without complications: Secondary | ICD-10-CM | POA: Diagnosis not present

## 2019-04-18 DIAGNOSIS — I10 Essential (primary) hypertension: Secondary | ICD-10-CM | POA: Diagnosis not present

## 2019-05-02 DIAGNOSIS — E559 Vitamin D deficiency, unspecified: Secondary | ICD-10-CM | POA: Diagnosis not present

## 2019-05-02 DIAGNOSIS — M109 Gout, unspecified: Secondary | ICD-10-CM | POA: Diagnosis not present

## 2019-05-02 DIAGNOSIS — Z6837 Body mass index (BMI) 37.0-37.9, adult: Secondary | ICD-10-CM | POA: Diagnosis not present

## 2019-05-02 DIAGNOSIS — Z1211 Encounter for screening for malignant neoplasm of colon: Secondary | ICD-10-CM | POA: Diagnosis not present

## 2019-05-02 DIAGNOSIS — F321 Major depressive disorder, single episode, moderate: Secondary | ICD-10-CM | POA: Diagnosis not present

## 2019-05-02 DIAGNOSIS — I10 Essential (primary) hypertension: Secondary | ICD-10-CM | POA: Diagnosis not present

## 2019-05-02 DIAGNOSIS — Z1331 Encounter for screening for depression: Secondary | ICD-10-CM | POA: Diagnosis not present

## 2019-05-02 DIAGNOSIS — R5383 Other fatigue: Secondary | ICD-10-CM | POA: Diagnosis not present

## 2019-05-02 DIAGNOSIS — Z79899 Other long term (current) drug therapy: Secondary | ICD-10-CM | POA: Diagnosis not present

## 2019-05-02 DIAGNOSIS — Z1339 Encounter for screening examination for other mental health and behavioral disorders: Secondary | ICD-10-CM | POA: Diagnosis not present

## 2019-05-02 DIAGNOSIS — M2550 Pain in unspecified joint: Secondary | ICD-10-CM | POA: Diagnosis not present

## 2019-05-02 DIAGNOSIS — E7800 Pure hypercholesterolemia, unspecified: Secondary | ICD-10-CM | POA: Diagnosis not present

## 2019-05-02 DIAGNOSIS — Z299 Encounter for prophylactic measures, unspecified: Secondary | ICD-10-CM | POA: Diagnosis not present

## 2019-05-02 DIAGNOSIS — Z7189 Other specified counseling: Secondary | ICD-10-CM | POA: Diagnosis not present

## 2019-05-02 DIAGNOSIS — Z0000 Encounter for general adult medical examination without abnormal findings: Secondary | ICD-10-CM | POA: Diagnosis not present

## 2019-05-14 DIAGNOSIS — E119 Type 2 diabetes mellitus without complications: Secondary | ICD-10-CM | POA: Diagnosis not present

## 2019-05-23 ENCOUNTER — Ambulatory Visit (INDEPENDENT_AMBULATORY_CARE_PROVIDER_SITE_OTHER): Payer: Medicare Other | Admitting: Podiatry

## 2019-05-23 ENCOUNTER — Encounter: Payer: Self-pay | Admitting: Podiatry

## 2019-05-23 ENCOUNTER — Other Ambulatory Visit: Payer: Self-pay

## 2019-05-23 DIAGNOSIS — M2140 Flat foot [pes planus] (acquired), unspecified foot: Secondary | ICD-10-CM

## 2019-05-23 DIAGNOSIS — B351 Tinea unguium: Secondary | ICD-10-CM

## 2019-05-23 DIAGNOSIS — L84 Corns and callosities: Secondary | ICD-10-CM | POA: Diagnosis not present

## 2019-05-23 DIAGNOSIS — M79675 Pain in left toe(s): Secondary | ICD-10-CM | POA: Diagnosis not present

## 2019-05-23 DIAGNOSIS — M76829 Posterior tibial tendinitis, unspecified leg: Secondary | ICD-10-CM

## 2019-05-23 DIAGNOSIS — M79674 Pain in right toe(s): Secondary | ICD-10-CM

## 2019-05-23 DIAGNOSIS — E1142 Type 2 diabetes mellitus with diabetic polyneuropathy: Secondary | ICD-10-CM | POA: Diagnosis not present

## 2019-05-23 NOTE — Progress Notes (Signed)
Complaint:  Visit Type: Patient returns to my office for continued preventative foot care services. Complaint: Patient states" my nails have grown long and thick and become painful to walk and wear shoes".   Patient has been diagnosed with DM with no foot complications. The patient presents for preventative foot care services. No changes to ROS  Podiatric Exam: Vascular: dorsalis pedis and posterior tibial pulses are palpable bilateral. Capillary return is immediate. Temperature gradient is WNL. Skin turgor WNL  Sensorium: Diminished  Semmes Weinstein monofilament test. Normal tactile sensation bilaterally. Nail Exam: Pt has thick disfigured discolored nails with subungual debris noted bilateral entire nail hallux through fifth toenails Ulcer Exam: There is no evidence of ulcer or pre-ulcerative changes or infection. Orthopedic Exam: Muscle tone and strength are WNL. No limitations in general ROM. No crepitus or effusions noted. Foot type and digits show no abnormalities. PTTD right foot.  Bony prominence rearfoot medially. HAV  B/L.   Skin:  Porokeratosis.right foot. No infection or ulcers.  Callus sub 1st MPJ  B/L.  Diagnosis:  Onychomycosis, , Pain in right toe, pain in left toes porokeratosis right  foot.  Treatment & Plan Procedures and Treatment: Consent by patient was obtained for treatment procedures.   Debridement of mycotic and hypertrophic toenails, 1 through 5 bilateral and clearing of subungual debris. No ulceration, no infection noted.  Debride callus  Right foot. Return Visit-Office Procedure: Patient instructed to return to the office for a follow up visit 3 months for continued evaluation and treatment.    Helane Gunther DPM

## 2019-05-29 DIAGNOSIS — S52592D Other fractures of lower end of left radius, subsequent encounter for closed fracture with routine healing: Secondary | ICD-10-CM | POA: Diagnosis not present

## 2019-05-29 DIAGNOSIS — E1122 Type 2 diabetes mellitus with diabetic chronic kidney disease: Secondary | ICD-10-CM | POA: Diagnosis not present

## 2019-05-29 DIAGNOSIS — Z6837 Body mass index (BMI) 37.0-37.9, adult: Secondary | ICD-10-CM | POA: Diagnosis not present

## 2019-05-29 DIAGNOSIS — E1165 Type 2 diabetes mellitus with hyperglycemia: Secondary | ICD-10-CM | POA: Diagnosis not present

## 2019-05-29 DIAGNOSIS — J45909 Unspecified asthma, uncomplicated: Secondary | ICD-10-CM | POA: Diagnosis not present

## 2019-05-29 DIAGNOSIS — N1832 Chronic kidney disease, stage 3b: Secondary | ICD-10-CM | POA: Diagnosis not present

## 2019-05-29 DIAGNOSIS — I10 Essential (primary) hypertension: Secondary | ICD-10-CM | POA: Diagnosis not present

## 2019-05-29 DIAGNOSIS — Z299 Encounter for prophylactic measures, unspecified: Secondary | ICD-10-CM | POA: Diagnosis not present

## 2019-06-01 DIAGNOSIS — Z23 Encounter for immunization: Secondary | ICD-10-CM | POA: Diagnosis not present

## 2019-06-13 DIAGNOSIS — E119 Type 2 diabetes mellitus without complications: Secondary | ICD-10-CM | POA: Diagnosis not present

## 2019-06-29 DIAGNOSIS — Z23 Encounter for immunization: Secondary | ICD-10-CM | POA: Diagnosis not present

## 2019-07-13 DIAGNOSIS — M25562 Pain in left knee: Secondary | ICD-10-CM | POA: Diagnosis not present

## 2019-07-13 DIAGNOSIS — Z299 Encounter for prophylactic measures, unspecified: Secondary | ICD-10-CM | POA: Diagnosis not present

## 2019-07-13 DIAGNOSIS — Z6837 Body mass index (BMI) 37.0-37.9, adult: Secondary | ICD-10-CM | POA: Diagnosis not present

## 2019-07-13 DIAGNOSIS — M7989 Other specified soft tissue disorders: Secondary | ICD-10-CM | POA: Diagnosis not present

## 2019-07-13 DIAGNOSIS — I10 Essential (primary) hypertension: Secondary | ICD-10-CM | POA: Diagnosis not present

## 2019-07-13 DIAGNOSIS — M2550 Pain in unspecified joint: Secondary | ICD-10-CM | POA: Diagnosis not present

## 2019-07-16 DIAGNOSIS — I82403 Acute embolism and thrombosis of unspecified deep veins of lower extremity, bilateral: Secondary | ICD-10-CM | POA: Diagnosis not present

## 2019-07-16 DIAGNOSIS — M25562 Pain in left knee: Secondary | ICD-10-CM | POA: Diagnosis not present

## 2019-07-17 DIAGNOSIS — E119 Type 2 diabetes mellitus without complications: Secondary | ICD-10-CM | POA: Diagnosis not present

## 2019-08-01 DIAGNOSIS — I10 Essential (primary) hypertension: Secondary | ICD-10-CM | POA: Diagnosis not present

## 2019-08-01 DIAGNOSIS — E119 Type 2 diabetes mellitus without complications: Secondary | ICD-10-CM | POA: Diagnosis not present

## 2019-08-17 DIAGNOSIS — E119 Type 2 diabetes mellitus without complications: Secondary | ICD-10-CM | POA: Diagnosis not present

## 2019-08-22 ENCOUNTER — Other Ambulatory Visit: Payer: Self-pay

## 2019-08-22 ENCOUNTER — Encounter: Payer: Self-pay | Admitting: Podiatry

## 2019-08-22 ENCOUNTER — Ambulatory Visit (INDEPENDENT_AMBULATORY_CARE_PROVIDER_SITE_OTHER): Payer: Medicare Other | Admitting: Podiatry

## 2019-08-22 VITALS — Temp 97.2°F

## 2019-08-22 DIAGNOSIS — M79675 Pain in left toe(s): Secondary | ICD-10-CM

## 2019-08-22 DIAGNOSIS — L84 Corns and callosities: Secondary | ICD-10-CM | POA: Diagnosis not present

## 2019-08-22 DIAGNOSIS — E1142 Type 2 diabetes mellitus with diabetic polyneuropathy: Secondary | ICD-10-CM | POA: Diagnosis not present

## 2019-08-22 DIAGNOSIS — M79674 Pain in right toe(s): Secondary | ICD-10-CM | POA: Diagnosis not present

## 2019-08-22 DIAGNOSIS — M19079 Primary osteoarthritis, unspecified ankle and foot: Secondary | ICD-10-CM

## 2019-08-22 DIAGNOSIS — B351 Tinea unguium: Secondary | ICD-10-CM

## 2019-08-22 DIAGNOSIS — M2140 Flat foot [pes planus] (acquired), unspecified foot: Secondary | ICD-10-CM

## 2019-08-22 NOTE — Progress Notes (Signed)
This patient returns to my office for at risk foot care.  This patient requires this care by a professional since this patient will be at risk due to having  Diabetes.   This patient is unable to cut nails himself since the patient cannot reach his nails.These nails are painful walking and wearing shoes.  This patient presents for at risk foot care today.  General Appearance  Alert, conversant and in no acute stress.  Vascular  Dorsalis pedis and posterior tibial  pulses are palpable  bilaterally.  Capillary return is within normal limits  bilaterally. Temperature is within normal limits  bilaterally.  Neurologic  Senn-Weinstein monofilament wire test within normal limits  bilaterally. Muscle power within normal limits bilaterally.  Nails Thick disfigured discolored nails with subungual debris  from hallux to fifth toes bilaterally. No evidence of bacterial infection or drainage bilaterally.  Orthopedic  No limitations of motion  feet .  No crepitus or effusions noted.  No bony pathology or digital deformities noted.  PTTD and foot arthritis right foot.  Skin  normotropic skin with no porokeratosis noted bilaterally.  No signs of infections or ulcers noted.     Onychomycosis  Pain in right toes  Pain in left toes  Consent was obtained for treatment procedures.   Mechanical debridement of nails 1-5  bilaterally performed with a nail nipper.  Filed with dremel without incident. Debride callus with # 15 blades.   Return office visit   3 months                   Told patient to return for periodic foot care and evaluation due to potential at risk complications.   Kierston Plasencia DPM  

## 2019-09-05 ENCOUNTER — Ambulatory Visit (INDEPENDENT_AMBULATORY_CARE_PROVIDER_SITE_OTHER): Payer: Medicare Other

## 2019-09-05 ENCOUNTER — Encounter: Payer: Self-pay | Admitting: Podiatry

## 2019-09-05 ENCOUNTER — Ambulatory Visit (INDEPENDENT_AMBULATORY_CARE_PROVIDER_SITE_OTHER): Payer: Medicare Other | Admitting: Podiatry

## 2019-09-05 ENCOUNTER — Other Ambulatory Visit: Payer: Self-pay

## 2019-09-05 DIAGNOSIS — B351 Tinea unguium: Secondary | ICD-10-CM

## 2019-09-05 DIAGNOSIS — M79672 Pain in left foot: Secondary | ICD-10-CM

## 2019-09-05 DIAGNOSIS — M76829 Posterior tibial tendinitis, unspecified leg: Secondary | ICD-10-CM | POA: Diagnosis not present

## 2019-09-05 DIAGNOSIS — M79675 Pain in left toe(s): Secondary | ICD-10-CM

## 2019-09-05 DIAGNOSIS — M79674 Pain in right toe(s): Secondary | ICD-10-CM

## 2019-09-05 DIAGNOSIS — M2140 Flat foot [pes planus] (acquired), unspecified foot: Secondary | ICD-10-CM

## 2019-09-05 DIAGNOSIS — M79671 Pain in right foot: Secondary | ICD-10-CM | POA: Diagnosis not present

## 2019-09-05 DIAGNOSIS — E1142 Type 2 diabetes mellitus with diabetic polyneuropathy: Secondary | ICD-10-CM

## 2019-09-05 DIAGNOSIS — M19079 Primary osteoarthritis, unspecified ankle and foot: Secondary | ICD-10-CM | POA: Diagnosis not present

## 2019-09-06 ENCOUNTER — Telehealth: Payer: Self-pay | Admitting: *Deleted

## 2019-09-06 MED ORDER — CELECOXIB 100 MG PO CAPS: 100.0000 mg | ORAL_CAPSULE | Freq: Two times a day (BID) | ORAL | 2 refills | Status: DC

## 2019-09-06 NOTE — Telephone Encounter (Signed)
I informed pt the celebrex had been sent to the pharmacy today.

## 2019-09-06 NOTE — Telephone Encounter (Signed)
Pt states Dr. Logan Bores was to send celebrex to the [facility] in [place] after yesterday's visit.

## 2019-09-08 NOTE — Telephone Encounter (Signed)
Thanks

## 2019-09-08 NOTE — Progress Notes (Signed)
   Subjective:  69 y.o. female with PMHx of diabetes mellitus presenting today as a new patient with a chief complaint of bilateral foot pain, right worse than left, that began several years ago. She describes the pain as stabbing and burning and states she hears a popping sound when she gets out of bed. Walking and standing for long periods of time increases the pain. She has been soaking the feet for treatment. Patient is here for further evaluation and treatment.   Past Medical History:  Diagnosis Date  . DM (diabetes mellitus) (HCC)        Objective/Physical Exam General: The patient is alert and oriented x3 in no acute distress.  Dermatology: Skin is warm, dry and supple bilateral lower extremities. Negative for open lesions or macerations.  Vascular: Palpable pedal pulses bilaterally. No edema or erythema noted. Capillary refill within normal limits.  Neurological: Epicritic and protective threshold diminished bilaterally.   Musculoskeletal Exam: Range of motion within normal limits to all pedal and ankle joints bilateral. Muscle strength 5/5 in all groups bilateral.  Upon weightbearing there is a medial longitudinal arch collapse bilaterally. Remove foot valgus noted to the bilateral lower extremities with excessive pronation upon mid stance. Pain on palpation noted to the posterior tibial tendon of the right foot with medial longitudinal arch collapse noted.  Radiographic Exam:  Normal osseous mineralization. Joint spaces preserved. No fracture/dislocation/boney destruction.   Pes planus noted on radiographic exam lateral views. Decreased calcaneal inclination and metatarsal declination angle is noted. Anterior break in the cyma line noted on lateral views. Medial talar head to deviation noted on AP radiograph.   Assessment: 1. pes planus bilateral 2. PTTD right  3. DJD right 4. Type II DM with peripheral polyneuropathy    Plan of Care:  1. Patient was evaluated. X-Rays  reviewed.  2. Prescription for Celebrex 100 mg BID provided to patient. Discontinue taking Meloxicam from PCP.  3. Appointment with Pedorthist for DM shoes. 4. Return to clinic in 3 months with Dr. Stacie Acres for routine foot care.     Felecia Shelling, DPM [facility]  Dr. Felecia Shelling, DPM    [address]                                        [place], [place] [address]                Office [phone]  Fax [phone]

## 2019-09-26 ENCOUNTER — Other Ambulatory Visit: Payer: Self-pay

## 2019-09-26 ENCOUNTER — Ambulatory Visit: Payer: Medicare Other | Admitting: Orthotics

## 2019-09-26 DIAGNOSIS — M79671 Pain in right foot: Secondary | ICD-10-CM

## 2019-09-26 DIAGNOSIS — M2140 Flat foot [pes planus] (acquired), unspecified foot: Secondary | ICD-10-CM

## 2019-09-26 DIAGNOSIS — M79675 Pain in left toe(s): Secondary | ICD-10-CM

## 2019-09-26 NOTE — Progress Notes (Signed)
jPatient was measured for med necessity extra depth shoes (x2) w/ 3 pair (x6) custom f/o. Patient was measured w/ brannock to determine size and width.  Foam impression mold was achieved and deemed appropriate for fabrication of  cmfo.   See DPM chart notes for further documentation and dx codes for determination of medical necessity.  Appropriate forms will be sent to PCP to verify and sign off on medical necessity.

## 2019-10-12 ENCOUNTER — Telehealth: Payer: Self-pay | Admitting: Podiatry

## 2019-10-12 NOTE — Telephone Encounter (Signed)
Pt left message checking to see if diabetic shoe paperwork has been signed off yet.  I returned call and left message we have not received the signed paperwork as of yet.

## 2019-10-17 DIAGNOSIS — E1165 Type 2 diabetes mellitus with hyperglycemia: Secondary | ICD-10-CM | POA: Diagnosis not present

## 2019-10-17 DIAGNOSIS — I10 Essential (primary) hypertension: Secondary | ICD-10-CM | POA: Diagnosis not present

## 2019-10-17 DIAGNOSIS — Z299 Encounter for prophylactic measures, unspecified: Secondary | ICD-10-CM | POA: Diagnosis not present

## 2019-10-17 DIAGNOSIS — Z6837 Body mass index (BMI) 37.0-37.9, adult: Secondary | ICD-10-CM | POA: Diagnosis not present

## 2019-10-17 DIAGNOSIS — E119 Type 2 diabetes mellitus without complications: Secondary | ICD-10-CM | POA: Diagnosis not present

## 2019-10-17 DIAGNOSIS — N183 Chronic kidney disease, stage 3 unspecified: Secondary | ICD-10-CM | POA: Diagnosis not present

## 2019-10-17 DIAGNOSIS — F321 Major depressive disorder, single episode, moderate: Secondary | ICD-10-CM | POA: Diagnosis not present

## 2019-10-17 DIAGNOSIS — E1122 Type 2 diabetes mellitus with diabetic chronic kidney disease: Secondary | ICD-10-CM | POA: Diagnosis not present

## 2019-10-23 ENCOUNTER — Telehealth: Payer: Self-pay | Admitting: Podiatry

## 2019-10-23 NOTE — Telephone Encounter (Signed)
Pt left message last week stating her doctor was faxing the diabetic paperwork over.  I returned call and left message we did receive one page but you could not read it and the second page was blank but I have refaxed the paperwork and asked them to fax it directly to me and I will send it over and will call pt when I get the shoes/inserts in the office.

## 2019-10-24 ENCOUNTER — Telehealth: Payer: Self-pay | Admitting: Podiatry

## 2019-10-24 NOTE — Telephone Encounter (Signed)
Pt left message asking if we received diabetic paperwork from Dr Sherril Croon.  I lvm for pt that I have not received the paperwork as of yet.

## 2019-11-01 ENCOUNTER — Other Ambulatory Visit: Payer: Self-pay | Admitting: Orthopaedic Surgery

## 2019-11-01 DIAGNOSIS — M25511 Pain in right shoulder: Secondary | ICD-10-CM

## 2019-11-12 ENCOUNTER — Ambulatory Visit
Admission: RE | Admit: 2019-11-12 | Discharge: 2019-11-12 | Disposition: A | Discharge status: Home / Self Care | Payer: Medicare Other | Source: Ambulatory Visit | Attending: Orthopaedic Surgery | Admitting: Orthopaedic Surgery

## 2019-11-12 DIAGNOSIS — M25511 Pain in right shoulder: Secondary | ICD-10-CM

## 2019-11-12 DIAGNOSIS — M25411 Effusion, right shoulder: Secondary | ICD-10-CM | POA: Diagnosis not present

## 2019-11-12 DIAGNOSIS — Z01818 Encounter for other preprocedural examination: Secondary | ICD-10-CM | POA: Diagnosis not present

## 2019-11-12 DIAGNOSIS — M19011 Primary osteoarthritis, right shoulder: Secondary | ICD-10-CM | POA: Diagnosis not present

## 2019-11-16 DIAGNOSIS — E119 Type 2 diabetes mellitus without complications: Secondary | ICD-10-CM | POA: Diagnosis not present

## 2019-11-16 DIAGNOSIS — I10 Essential (primary) hypertension: Secondary | ICD-10-CM | POA: Diagnosis not present

## 2019-11-21 ENCOUNTER — Telehealth: Payer: Self-pay | Admitting: Podiatry

## 2019-11-21 NOTE — Telephone Encounter (Signed)
Pt called asking about status of diabetic shoes.  Upon checking we are waiting on inserts and I have messaged safestep for status. Pt said thank you.

## 2019-11-27 ENCOUNTER — Ambulatory Visit (INDEPENDENT_AMBULATORY_CARE_PROVIDER_SITE_OTHER): Payer: Medicare Other | Admitting: Podiatry

## 2019-11-27 ENCOUNTER — Encounter: Payer: Self-pay | Admitting: Podiatry

## 2019-11-27 ENCOUNTER — Other Ambulatory Visit: Payer: Self-pay

## 2019-11-27 DIAGNOSIS — M79675 Pain in left toe(s): Secondary | ICD-10-CM | POA: Diagnosis not present

## 2019-11-27 DIAGNOSIS — M79674 Pain in right toe(s): Secondary | ICD-10-CM

## 2019-11-27 DIAGNOSIS — M2140 Flat foot [pes planus] (acquired), unspecified foot: Secondary | ICD-10-CM

## 2019-11-27 DIAGNOSIS — L84 Corns and callosities: Secondary | ICD-10-CM

## 2019-11-27 DIAGNOSIS — E1142 Type 2 diabetes mellitus with diabetic polyneuropathy: Secondary | ICD-10-CM

## 2019-11-27 DIAGNOSIS — B351 Tinea unguium: Secondary | ICD-10-CM | POA: Diagnosis not present

## 2019-11-27 DIAGNOSIS — M19079 Primary osteoarthritis, unspecified ankle and foot: Secondary | ICD-10-CM

## 2019-11-27 DIAGNOSIS — M76829 Posterior tibial tendinitis, unspecified leg: Secondary | ICD-10-CM

## 2019-11-27 NOTE — Progress Notes (Signed)
This patient returns to my office for at risk foot care.  This patient requires this care by a professional since this patient will be at risk due to having  Diabetes.   This patient is unable to cut nails himself since the patient cannot reach his nails.These nails are painful walking and wearing shoes.  This patient presents for at risk foot care today.  General Appearance  Alert, conversant and in no acute stress.  Vascular  Dorsalis pedis and posterior tibial  pulses are palpable  bilaterally.  Capillary return is within normal limits  bilaterally. Temperature is within normal limits  bilaterally.  Neurologic  Senn-Weinstein monofilament wire test within normal limits  bilaterally. Muscle power within normal limits bilaterally.  Nails Thick disfigured discolored nails with subungual debris  from hallux to fifth toes bilaterally. No evidence of bacterial infection or drainage bilaterally.  Orthopedic  No limitations of motion  feet .  No crepitus or effusions noted.  No bony pathology or digital deformities noted.  PTTD and foot arthritis right foot.  Skin  normotropic skin with no porokeratosis noted bilaterally.  No signs of infections or ulcers noted.     Onychomycosis  Pain in right toes  Pain in left toes  Consent was obtained for treatment procedures.   Mechanical debridement of nails 1-5  bilaterally performed with a nail nipper.  Filed with dremel without incident. Debride callus with # 15 blades.   Return office visit   3 months                   Told patient to return for periodic foot care and evaluation due to potential at risk complications.   Helane Gunther DPM

## 2019-12-11 DIAGNOSIS — I10 Essential (primary) hypertension: Secondary | ICD-10-CM | POA: Diagnosis not present

## 2019-12-11 DIAGNOSIS — F321 Major depressive disorder, single episode, moderate: Secondary | ICD-10-CM | POA: Diagnosis not present

## 2019-12-11 DIAGNOSIS — E1122 Type 2 diabetes mellitus with diabetic chronic kidney disease: Secondary | ICD-10-CM | POA: Diagnosis not present

## 2019-12-11 DIAGNOSIS — Z299 Encounter for prophylactic measures, unspecified: Secondary | ICD-10-CM | POA: Diagnosis not present

## 2019-12-13 ENCOUNTER — Ambulatory Visit (INDEPENDENT_AMBULATORY_CARE_PROVIDER_SITE_OTHER): Payer: Medicare Other | Admitting: Orthotics

## 2019-12-13 ENCOUNTER — Other Ambulatory Visit: Payer: Self-pay

## 2019-12-13 DIAGNOSIS — M19079 Primary osteoarthritis, unspecified ankle and foot: Secondary | ICD-10-CM

## 2019-12-13 DIAGNOSIS — M76821 Posterior tibial tendinitis, right leg: Secondary | ICD-10-CM

## 2019-12-13 DIAGNOSIS — M76822 Posterior tibial tendinitis, left leg: Secondary | ICD-10-CM | POA: Diagnosis not present

## 2019-12-13 DIAGNOSIS — M19072 Primary osteoarthritis, left ankle and foot: Secondary | ICD-10-CM | POA: Diagnosis not present

## 2019-12-13 DIAGNOSIS — M2142 Flat foot [pes planus] (acquired), left foot: Secondary | ICD-10-CM

## 2019-12-13 DIAGNOSIS — M79674 Pain in right toe(s): Secondary | ICD-10-CM

## 2019-12-13 DIAGNOSIS — M19071 Primary osteoarthritis, right ankle and foot: Secondary | ICD-10-CM

## 2019-12-13 DIAGNOSIS — L84 Corns and callosities: Secondary | ICD-10-CM

## 2019-12-13 DIAGNOSIS — M2140 Flat foot [pes planus] (acquired), unspecified foot: Secondary | ICD-10-CM

## 2019-12-13 DIAGNOSIS — E1142 Type 2 diabetes mellitus with diabetic polyneuropathy: Secondary | ICD-10-CM

## 2019-12-13 DIAGNOSIS — B351 Tinea unguium: Secondary | ICD-10-CM

## 2019-12-13 DIAGNOSIS — M79675 Pain in left toe(s): Secondary | ICD-10-CM

## 2019-12-13 NOTE — Progress Notes (Signed)

## 2019-12-17 DIAGNOSIS — I10 Essential (primary) hypertension: Secondary | ICD-10-CM | POA: Diagnosis not present

## 2019-12-17 DIAGNOSIS — E119 Type 2 diabetes mellitus without complications: Secondary | ICD-10-CM | POA: Diagnosis not present

## 2019-12-18 DIAGNOSIS — E119 Type 2 diabetes mellitus without complications: Secondary | ICD-10-CM | POA: Diagnosis not present

## 2020-01-07 DIAGNOSIS — N183 Chronic kidney disease, stage 3 unspecified: Secondary | ICD-10-CM | POA: Diagnosis not present

## 2020-01-07 DIAGNOSIS — I10 Essential (primary) hypertension: Secondary | ICD-10-CM | POA: Diagnosis not present

## 2020-01-07 DIAGNOSIS — N1832 Chronic kidney disease, stage 3b: Secondary | ICD-10-CM | POA: Diagnosis not present

## 2020-01-07 DIAGNOSIS — Z299 Encounter for prophylactic measures, unspecified: Secondary | ICD-10-CM | POA: Diagnosis not present

## 2020-01-07 DIAGNOSIS — M25561 Pain in right knee: Secondary | ICD-10-CM | POA: Diagnosis not present

## 2020-01-11 DIAGNOSIS — Z23 Encounter for immunization: Secondary | ICD-10-CM | POA: Diagnosis not present

## 2020-01-17 DIAGNOSIS — I10 Essential (primary) hypertension: Secondary | ICD-10-CM | POA: Diagnosis not present

## 2020-01-17 DIAGNOSIS — E119 Type 2 diabetes mellitus without complications: Secondary | ICD-10-CM | POA: Diagnosis not present

## 2020-02-05 DIAGNOSIS — M1711 Unilateral primary osteoarthritis, right knee: Secondary | ICD-10-CM | POA: Diagnosis not present

## 2020-02-15 DIAGNOSIS — E119 Type 2 diabetes mellitus without complications: Secondary | ICD-10-CM | POA: Diagnosis not present

## 2020-02-15 DIAGNOSIS — I10 Essential (primary) hypertension: Secondary | ICD-10-CM | POA: Diagnosis not present

## 2020-02-16 DIAGNOSIS — E119 Type 2 diabetes mellitus without complications: Secondary | ICD-10-CM | POA: Diagnosis not present

## 2020-02-27 ENCOUNTER — Other Ambulatory Visit: Payer: Self-pay

## 2020-02-27 ENCOUNTER — Encounter: Payer: Self-pay | Admitting: Podiatry

## 2020-02-27 ENCOUNTER — Ambulatory Visit (INDEPENDENT_AMBULATORY_CARE_PROVIDER_SITE_OTHER): Payer: Medicare Other | Admitting: Podiatry

## 2020-02-27 DIAGNOSIS — M2140 Flat foot [pes planus] (acquired), unspecified foot: Secondary | ICD-10-CM

## 2020-02-27 DIAGNOSIS — B351 Tinea unguium: Secondary | ICD-10-CM | POA: Diagnosis not present

## 2020-02-27 DIAGNOSIS — M79675 Pain in left toe(s): Secondary | ICD-10-CM | POA: Diagnosis not present

## 2020-02-27 DIAGNOSIS — M19079 Primary osteoarthritis, unspecified ankle and foot: Secondary | ICD-10-CM | POA: Diagnosis not present

## 2020-02-27 DIAGNOSIS — E1142 Type 2 diabetes mellitus with diabetic polyneuropathy: Secondary | ICD-10-CM

## 2020-02-27 DIAGNOSIS — M79674 Pain in right toe(s): Secondary | ICD-10-CM | POA: Diagnosis not present

## 2020-02-27 NOTE — Progress Notes (Signed)
This patient returns to my office for at risk foot care.  This patient requires this care by a professional since this patient will be at risk due to having  Diabetes.   This patient is unable to cut nails himself since the patient cannot reach his nails.These nails are painful walking and wearing shoes. Patient is pleased with her diabetic shoes.  She says she bought her own shoes on line. This patient presents for at risk foot care today.  General Appearance  Alert, conversant and in no acute stress.  Vascular  Dorsalis pedis and posterior tibial  pulses are palpable  bilaterally.  Capillary return is within normal limits  bilaterally. Temperature is within normal limits  bilaterally.  Neurologic  Senn-Weinstein monofilament wire test within normal limits  bilaterally. Muscle power within normal limits bilaterally.  Nails Thick disfigured discolored nails with subungual debris  from hallux to fifth toes bilaterally. No evidence of bacterial infection or drainage bilaterally.  Orthopedic  No limitations of motion  feet .  No crepitus or effusions noted.  No bony pathology or digital deformities noted.  PTTD and foot arthritis right foot.  Skin  normotropic skin with no porokeratosis noted bilaterally.  No signs of infections or ulcers noted.   Asymptomatic callus TNJ right foot.  Onychomycosis  Pain in right toes  Pain in left toes  Consent was obtained for treatment procedures.   Mechanical debridement of nails 1-5  bilaterally performed with a nail nipper.  Filed with dremel without incident.   Return office visit   3 months                   Told patient to return for periodic foot care and evaluation due to potential at risk complications.   Helane Gunther DPM

## 2020-02-28 DIAGNOSIS — Z23 Encounter for immunization: Secondary | ICD-10-CM | POA: Diagnosis not present

## 2020-03-07 DIAGNOSIS — Z6838 Body mass index (BMI) 38.0-38.9, adult: Secondary | ICD-10-CM | POA: Diagnosis not present

## 2020-03-07 DIAGNOSIS — Z299 Encounter for prophylactic measures, unspecified: Secondary | ICD-10-CM | POA: Diagnosis not present

## 2020-03-07 DIAGNOSIS — I10 Essential (primary) hypertension: Secondary | ICD-10-CM | POA: Diagnosis not present

## 2020-03-07 DIAGNOSIS — M25511 Pain in right shoulder: Secondary | ICD-10-CM | POA: Diagnosis not present

## 2020-03-18 DIAGNOSIS — I10 Essential (primary) hypertension: Secondary | ICD-10-CM | POA: Diagnosis not present

## 2020-03-18 DIAGNOSIS — E119 Type 2 diabetes mellitus without complications: Secondary | ICD-10-CM | POA: Diagnosis not present

## 2020-03-21 DIAGNOSIS — E1165 Type 2 diabetes mellitus with hyperglycemia: Secondary | ICD-10-CM | POA: Diagnosis not present

## 2020-03-21 DIAGNOSIS — E1122 Type 2 diabetes mellitus with diabetic chronic kidney disease: Secondary | ICD-10-CM | POA: Diagnosis not present

## 2020-03-21 DIAGNOSIS — Z299 Encounter for prophylactic measures, unspecified: Secondary | ICD-10-CM | POA: Diagnosis not present

## 2020-03-21 DIAGNOSIS — I10 Essential (primary) hypertension: Secondary | ICD-10-CM | POA: Diagnosis not present

## 2020-03-21 DIAGNOSIS — F321 Major depressive disorder, single episode, moderate: Secondary | ICD-10-CM | POA: Diagnosis not present

## 2020-03-21 DIAGNOSIS — N183 Chronic kidney disease, stage 3 unspecified: Secondary | ICD-10-CM | POA: Diagnosis not present

## 2020-04-17 DIAGNOSIS — I10 Essential (primary) hypertension: Secondary | ICD-10-CM | POA: Diagnosis not present

## 2020-04-17 DIAGNOSIS — E119 Type 2 diabetes mellitus without complications: Secondary | ICD-10-CM | POA: Diagnosis not present

## 2020-05-19 DIAGNOSIS — E119 Type 2 diabetes mellitus without complications: Secondary | ICD-10-CM | POA: Diagnosis not present

## 2020-06-03 ENCOUNTER — Encounter: Payer: Self-pay | Admitting: Podiatry

## 2020-06-03 ENCOUNTER — Ambulatory Visit (INDEPENDENT_AMBULATORY_CARE_PROVIDER_SITE_OTHER): Payer: Medicare Other | Admitting: Podiatry

## 2020-06-03 ENCOUNTER — Other Ambulatory Visit: Payer: Self-pay

## 2020-06-03 DIAGNOSIS — M19079 Primary osteoarthritis, unspecified ankle and foot: Secondary | ICD-10-CM

## 2020-06-03 DIAGNOSIS — M79675 Pain in left toe(s): Secondary | ICD-10-CM | POA: Diagnosis not present

## 2020-06-03 DIAGNOSIS — E1142 Type 2 diabetes mellitus with diabetic polyneuropathy: Secondary | ICD-10-CM

## 2020-06-03 DIAGNOSIS — B351 Tinea unguium: Secondary | ICD-10-CM | POA: Diagnosis not present

## 2020-06-03 DIAGNOSIS — M79674 Pain in right toe(s): Secondary | ICD-10-CM

## 2020-06-03 DIAGNOSIS — L84 Corns and callosities: Secondary | ICD-10-CM

## 2020-06-03 DIAGNOSIS — M76829 Posterior tibial tendinitis, unspecified leg: Secondary | ICD-10-CM

## 2020-06-03 DIAGNOSIS — M2140 Flat foot [pes planus] (acquired), unspecified foot: Secondary | ICD-10-CM

## 2020-06-03 NOTE — Progress Notes (Signed)
This patient returns to my office for at risk foot care.  This patient requires this care by a professional since this patient will be at risk due to having  Diabetes.   This patient is unable to cut nails himself since the patient cannot reach his nails.These nails are painful walking and wearing shoes. Patient is pleased with her diabetic shoes.  She says she bought her own shoes on line. This patient presents for at risk foot care today.  General Appearance  Alert, conversant and in no acute stress.  Vascular  Dorsalis pedis and posterior tibial  pulses are palpable  bilaterally.  Capillary return is within normal limits  bilaterally. Temperature is within normal limits  bilaterally.  Neurologic  Senn-Weinstein monofilament wire test within normal limits  bilaterally. Muscle power within normal limits bilaterally.  Nails Thick disfigured discolored nails with subungual debris  from hallux to fifth toes bilaterally. No evidence of bacterial infection or drainage bilaterally.  Orthopedic  No limitations of motion  feet .  No crepitus or effusions noted.  No bony pathology or digital deformities noted.  PTTD and foot arthritis right foot.  Skin  normotropic skin with no porokeratosis noted bilaterally.  No signs of infections or ulcers noted.   Symptomatic callus TNJ right foot.  Onychomycosis  Pain in right toes  Pain in left toes  Callus noted plantar  TNJ.  Consent was obtained for treatment procedures.   Mechanical debridement of nails 1-5  bilaterally performed with a nail nipper.  Filed with dremel without incident.  Debride callus with # 15 blade followed by dremel usage.   Return office visit   3 months                   Told patient to return for periodic foot care and evaluation due to potential at risk complications.   Helane Gunther DPM

## 2020-06-06 DIAGNOSIS — Z1339 Encounter for screening examination for other mental health and behavioral disorders: Secondary | ICD-10-CM | POA: Diagnosis not present

## 2020-06-06 DIAGNOSIS — F17210 Nicotine dependence, cigarettes, uncomplicated: Secondary | ICD-10-CM | POA: Diagnosis not present

## 2020-06-06 DIAGNOSIS — Z7189 Other specified counseling: Secondary | ICD-10-CM | POA: Diagnosis not present

## 2020-06-06 DIAGNOSIS — Z1331 Encounter for screening for depression: Secondary | ICD-10-CM | POA: Diagnosis not present

## 2020-06-06 DIAGNOSIS — L309 Dermatitis, unspecified: Secondary | ICD-10-CM | POA: Diagnosis not present

## 2020-06-06 DIAGNOSIS — Z79899 Other long term (current) drug therapy: Secondary | ICD-10-CM | POA: Diagnosis not present

## 2020-06-06 DIAGNOSIS — Z6837 Body mass index (BMI) 37.0-37.9, adult: Secondary | ICD-10-CM | POA: Diagnosis not present

## 2020-06-06 DIAGNOSIS — Z0000 Encounter for general adult medical examination without abnormal findings: Secondary | ICD-10-CM | POA: Diagnosis not present

## 2020-06-06 DIAGNOSIS — M25511 Pain in right shoulder: Secondary | ICD-10-CM | POA: Diagnosis not present

## 2020-06-06 DIAGNOSIS — R5383 Other fatigue: Secondary | ICD-10-CM | POA: Diagnosis not present

## 2020-06-06 DIAGNOSIS — E7800 Pure hypercholesterolemia, unspecified: Secondary | ICD-10-CM | POA: Diagnosis not present

## 2020-06-06 DIAGNOSIS — I10 Essential (primary) hypertension: Secondary | ICD-10-CM | POA: Diagnosis not present

## 2020-06-06 DIAGNOSIS — Z299 Encounter for prophylactic measures, unspecified: Secondary | ICD-10-CM | POA: Diagnosis not present

## 2020-06-16 DIAGNOSIS — E119 Type 2 diabetes mellitus without complications: Secondary | ICD-10-CM | POA: Diagnosis not present

## 2020-06-24 DIAGNOSIS — E8940 Asymptomatic postprocedural ovarian failure: Secondary | ICD-10-CM | POA: Diagnosis not present

## 2020-06-26 DIAGNOSIS — E1122 Type 2 diabetes mellitus with diabetic chronic kidney disease: Secondary | ICD-10-CM | POA: Diagnosis not present

## 2020-06-26 DIAGNOSIS — I10 Essential (primary) hypertension: Secondary | ICD-10-CM | POA: Diagnosis not present

## 2020-06-26 DIAGNOSIS — E1165 Type 2 diabetes mellitus with hyperglycemia: Secondary | ICD-10-CM | POA: Diagnosis not present

## 2020-06-26 DIAGNOSIS — Z299 Encounter for prophylactic measures, unspecified: Secondary | ICD-10-CM | POA: Diagnosis not present

## 2020-06-26 DIAGNOSIS — F321 Major depressive disorder, single episode, moderate: Secondary | ICD-10-CM | POA: Diagnosis not present

## 2020-07-17 DIAGNOSIS — E119 Type 2 diabetes mellitus without complications: Secondary | ICD-10-CM | POA: Diagnosis not present

## 2020-07-28 DIAGNOSIS — E119 Type 2 diabetes mellitus without complications: Secondary | ICD-10-CM | POA: Diagnosis not present

## 2020-08-15 DIAGNOSIS — E119 Type 2 diabetes mellitus without complications: Secondary | ICD-10-CM | POA: Diagnosis not present

## 2020-08-19 ENCOUNTER — Other Ambulatory Visit: Payer: Self-pay | Admitting: Orthopaedic Surgery

## 2020-08-19 DIAGNOSIS — M25511 Pain in right shoulder: Secondary | ICD-10-CM | POA: Diagnosis not present

## 2020-08-20 ENCOUNTER — Ambulatory Visit (INDEPENDENT_AMBULATORY_CARE_PROVIDER_SITE_OTHER): Payer: Medicare Other | Admitting: Podiatry

## 2020-08-20 ENCOUNTER — Other Ambulatory Visit: Payer: Self-pay

## 2020-08-20 ENCOUNTER — Encounter: Payer: Self-pay | Admitting: Podiatry

## 2020-08-20 DIAGNOSIS — M79675 Pain in left toe(s): Secondary | ICD-10-CM

## 2020-08-20 DIAGNOSIS — M19079 Primary osteoarthritis, unspecified ankle and foot: Secondary | ICD-10-CM

## 2020-08-20 DIAGNOSIS — M2140 Flat foot [pes planus] (acquired), unspecified foot: Secondary | ICD-10-CM

## 2020-08-20 DIAGNOSIS — M79674 Pain in right toe(s): Secondary | ICD-10-CM | POA: Diagnosis not present

## 2020-08-20 DIAGNOSIS — L84 Corns and callosities: Secondary | ICD-10-CM

## 2020-08-20 DIAGNOSIS — B351 Tinea unguium: Secondary | ICD-10-CM | POA: Diagnosis not present

## 2020-08-20 NOTE — Progress Notes (Signed)
This patient returns to my office for at risk foot care.  This patient requires this care by a professional since this patient will be at risk due to having  Diabetes.   This patient is unable to cut nails himself since the patient cannot reach his nails.These nails are painful walking and wearing shoes. Patient is pleased with her diabetic shoes.  . This patient presents for at risk foot care today.  General Appearance  Alert, conversant and in no acute stress.  Vascular  Dorsalis pedis and posterior tibial  pulses are palpable  bilaterally.  Capillary return is within normal limits  bilaterally. Temperature is within normal limits  bilaterally.  Neurologic  Senn-Weinstein monofilament wire test within normal limits  bilaterally. Muscle power within normal limits bilaterally.  Nails Thick disfigured discolored nails with subungual debris  from hallux to fifth toes bilaterally. No evidence of bacterial infection or drainage bilaterally.  Orthopedic  No limitations of motion  feet .  No crepitus or effusions noted.  No bony pathology or digital deformities noted.  PTTD and foot arthritis right foot.  Skin  normotropic skin with no porokeratosis noted bilaterally.  No signs of infections or ulcers noted.   Symptomatic callus TNJ right foot.  Callus midarch medially.  Onychomycosis  Pain in right toes  Pain in left toes  Callus noted plantar  TNJ.  Consent was obtained for treatment procedures.   Mechanical debridement of nails 1-5  bilaterally performed with a nail nipper.  Filed with dremel without incident.  Debride callus with # 15 blade followed by dremel usage.   Return office visit   3 months                   Told patient to return for periodic foot care and evaluation due to potential at risk complications.   Helane Gunther DPM

## 2020-08-22 ENCOUNTER — Other Ambulatory Visit: Payer: Self-pay

## 2020-08-22 ENCOUNTER — Ambulatory Visit
Admission: RE | Admit: 2020-08-22 | Discharge: 2020-08-22 | Disposition: A | Discharge status: Home / Self Care | Payer: Medicare Other | Source: Ambulatory Visit | Attending: Orthopaedic Surgery | Admitting: Orthopaedic Surgery

## 2020-08-22 DIAGNOSIS — M25511 Pain in right shoulder: Secondary | ICD-10-CM

## 2020-08-22 DIAGNOSIS — M19011 Primary osteoarthritis, right shoulder: Secondary | ICD-10-CM | POA: Diagnosis not present

## 2020-08-25 ENCOUNTER — Other Ambulatory Visit: Payer: Self-pay

## 2020-08-25 ENCOUNTER — Encounter (HOSPITAL_BASED_OUTPATIENT_CLINIC_OR_DEPARTMENT_OTHER): Payer: Self-pay | Admitting: Orthopaedic Surgery

## 2020-08-26 DIAGNOSIS — Z299 Encounter for prophylactic measures, unspecified: Secondary | ICD-10-CM | POA: Diagnosis not present

## 2020-08-26 DIAGNOSIS — E1165 Type 2 diabetes mellitus with hyperglycemia: Secondary | ICD-10-CM | POA: Diagnosis not present

## 2020-08-26 DIAGNOSIS — Z01818 Encounter for other preprocedural examination: Secondary | ICD-10-CM | POA: Diagnosis not present

## 2020-08-26 DIAGNOSIS — F321 Major depressive disorder, single episode, moderate: Secondary | ICD-10-CM | POA: Diagnosis not present

## 2020-08-26 DIAGNOSIS — I10 Essential (primary) hypertension: Secondary | ICD-10-CM | POA: Diagnosis not present

## 2020-08-27 ENCOUNTER — Encounter (HOSPITAL_BASED_OUTPATIENT_CLINIC_OR_DEPARTMENT_OTHER): Payer: Self-pay | Admitting: Orthopaedic Surgery

## 2020-08-28 DIAGNOSIS — M25511 Pain in right shoulder: Secondary | ICD-10-CM | POA: Diagnosis not present

## 2020-09-01 ENCOUNTER — Other Ambulatory Visit (HOSPITAL_COMMUNITY): Payer: Medicare Other

## 2020-09-04 DIAGNOSIS — M25511 Pain in right shoulder: Secondary | ICD-10-CM | POA: Diagnosis not present

## 2020-09-04 DIAGNOSIS — Z23 Encounter for immunization: Secondary | ICD-10-CM | POA: Diagnosis not present

## 2020-09-10 ENCOUNTER — Encounter (HOSPITAL_BASED_OUTPATIENT_CLINIC_OR_DEPARTMENT_OTHER)
Admission: RE | Admit: 2020-09-10 | Discharge: 2020-09-10 | Disposition: A | Discharge status: Home / Self Care | Payer: Medicare Other | Source: Ambulatory Visit | Attending: Orthopaedic Surgery | Admitting: Orthopaedic Surgery

## 2020-09-10 DIAGNOSIS — Z01818 Encounter for other preprocedural examination: Secondary | ICD-10-CM | POA: Diagnosis not present

## 2020-09-10 LAB — BASIC METABOLIC PANEL
Anion gap: 11 (ref 5–15)
BUN: 27 mg/dL — ABNORMAL HIGH (ref 8–23)
CO2: 25 mmol/L (ref 22–32)
Calcium: 9.2 mg/dL (ref 8.9–10.3)
Chloride: 100 mmol/L (ref 98–111)
Creatinine, Ser: 1.12 mg/dL — ABNORMAL HIGH (ref 0.44–1.00)
GFR, Estimated: 54 mL/min — ABNORMAL LOW (ref >60)
Glucose, Bld: 157 mg/dL — ABNORMAL HIGH (ref 70–99)
Potassium: 4 mmol/L (ref 3.5–5.1)
Sodium: 136 mmol/L (ref 135–145)

## 2020-09-10 LAB — SURGICAL PCR SCREEN
MRSA, PCR: NEGATIVE
Staphylococcus aureus: NEGATIVE

## 2020-09-10 NOTE — Progress Notes (Signed)
      Enhanced Recovery after Surgery for Orthopedics Enhanced Recovery after Surgery is a protocol used to improve the stress on your body and your recovery after surgery.  Patient Instructions  . The night before surgery:  o No food after midnight. ONLY clear liquids after midnight  . The day of surgery (if you do NOT have diabetes):  o Drink ONE (1) Pre-Surgery Clear Ensure as directed.   o This drink was given to you during your hospital  pre-op appointment visit. o The pre-op nurse will instruct you on the time to drink the  Pre-Surgery Ensure depending on your surgery time. o Finish the drink at the designated time by the pre-op nurse.  o Nothing else to drink after completing the  Pre-Surgery Clear Ensure.  . The day of surgery (if you have diabetes): o Drink ONE (1) Gatorade 2 (G2) as directed. o This drink was given to you during your hospital  pre-op appointment visit.  o The pre-op nurse will instruct you on the time to drink the   Gatorade 2 (G2) depending on your surgery time. o Color of the Gatorade may vary. Red is not allowed. o Nothing else to drink after completing the  Gatorade 2 (G2).         If you have questions, please contact your surgeon's office. Surgical soap given with instructions, pt verbalized understanding. Benzoyl gel given with written instructions, pt verbalized understanding.

## 2020-09-16 DIAGNOSIS — E119 Type 2 diabetes mellitus without complications: Secondary | ICD-10-CM | POA: Diagnosis not present

## 2020-09-16 NOTE — H&P (Signed)
PREOPERATIVE H&P  Chief Complaint: DJD RIGHT SHOULDER  HPI: Sheila [name] is a 70 y.o. female who is scheduled for Procedure(s): TOTAL SHOULDER ARTHROPLASTY.   Patient has a past medical history significant for DM, GERD, HTN, hyperlipidemia.   Patient is a 70 year-old female who has had a history of bilateral shoulder pain for some time.  She had injections and activity modification and hasn't made much progress.  She is unhappy with the function of her shoulders currently. Her right shoulder has worsened over the past few months.  Her symptoms are rated as moderate to severe, and have been worsening.  This is significantly impairing activities of daily living.    Please see clinic note for further details on this patient's care.    She has elected for surgical management.   Past Medical History:  Diagnosis Date  . DM (diabetes mellitus) (HCC)   . GERD (gastroesophageal reflux disease)   . Gout   . Hyperlipidemia   . Hypertension    History reviewed. No pertinent surgical history. Social History   Socioeconomic History  . Marital status: Married    Spouse name: Not on file  . Number of children: Not on file  . Years of education: Not on file  . Highest education level: Not on file  Occupational History  . Not on file  Tobacco Use  . Smoking status: Former Smoker    Packs/day: 1.00    Years: 2.00    Pack years: 2.00    Quit date: 04/20/1975    Years since quitting: 45.4  . Smokeless tobacco: Never Used  Substance and Sexual Activity  . Alcohol use: Not on file  . Drug use: Not on file  . Sexual activity: Not on file  Other Topics Concern  . Not on file  Social History Narrative  . Not on file   Social Determinants of Health   Financial Resource Strain: Not on file  Food Insecurity: Not on file  Transportation Needs: Not on file  Physical Activity: Not on file  Stress: Not on file  Social Connections: Not on file   Family History  Problem Relation Age  of Onset  . Heart disease Father    Allergies  Allergen Reactions  . Lisinopril Diarrhea  . Other   . Metformin Diarrhea   Prior to Admission medications   Medication Sig Start Date End Date Taking? Authorizing Provider  celecoxib (CELEBREX) 100 MG capsule Take 1 capsule (100 mg total) by mouth 2 (two) times daily. 09/06/19  Yes Felecia Shelling, DPM  Cholecalciferol (VITAMIN D3) 1.25 MG (50000 UT) CAPS Take 1 capsule by mouth once a week. 03/27/19  Yes [provider]  empagliflozin (JARDIANCE) 25 MG TABS tablet Take 12.5 mg by mouth daily.   Yes [provider]  furosemide (LASIX) 40 MG tablet TAKE 1 TABLET BY MOUTH DAILY (STOP BUMEX) 04/26/18  Yes [provider]  meloxicam (MOBIC) 15 MG tablet Take 15 mg by mouth daily. 05/02/19  Yes [provider]  NOVOLOG FLEXPEN 100 UNIT/ML FlexPen inject 18 UNITS INTO THE SKIN TWICE DAILY AND PER sliding scale (max DOSE 50 UNITS PER DAY) 03/13/18  Yes [provider]  omeprazole (PRILOSEC) 20 MG capsule Take by mouth.   Yes [provider]  traMADol (ULTRAM) 50 MG tablet Take 50 mg by mouth 3 (three) times daily as needed. 07/23/19  Yes [provider]  TRESIBA FLEXTOUCH 200 UNIT/ML SOPN inject 35 units AT BEDTIME (max  DOSE 60 units PER DAY) 03/13/18  Yes [provider]  triamcinolone cream (KENALOG) 0.1 % Apply topically 3 (three) times daily. 06/06/20  Yes [provider]  atorvastatin (LIPITOR) 10 MG tablet Take 10 mg by mouth at bedtime. 03/13/18   [provider]  atorvastatin (LIPITOR) 20 MG tablet Take 20 mg by mouth at bedtime. 05/05/19   [provider]  colchicine-probenecid 0.5-500 MG tablet Take 1 tablet by mouth daily. 04/12/19   [provider]  CONTOUR NEXT TEST test strip 4 (four) times daily. for testing 03/13/18   [provider]  diclofenac (VOLTAREN) 75 MG EC tablet diclofenac sodium 75 mg tablet,delayed release  TAKE 1  TABLET BY MOUTH TWICE DAILY AS NEEDED    [provider]  GLOBAL EASE INJECT PEN NEEDLES 31G X 5 MM MISC 4 (four) times daily. 04/17/18   [provider]  hydrOXYzine (ATARAX/VISTARIL) 10 MG tablet Take by mouth. 10/09/18   [provider]  hydrOXYzine (ATARAX/VISTARIL) 25 MG tablet Take 25 mg by mouth at bedtime. 04/12/19   [provider]  traZODone (DESYREL) 50 MG tablet Take 50 mg by mouth at bedtime. 01/09/20   [provider]  UNABLE TO FIND Pen Needle 31 gauge x 3/16"  USE FOUR TIMES DAILY    [provider]  UNABLE TO FIND true metrix strips (diabetic club)  DIABETIC CLUB    [provider]  UNABLE TO FIND true metrixstrips(diabeticclub50)  DIABETIC CLUB    [provider]  UNABLE TO FIND true metrixstrips(diabeticclub)  DIABETIC CLUB    [provider]  Insulin Detemir (LEVEMIR FLEXTOUCH) 100 UNIT/ML Pen Levemir FlexTouch U-100 Insulin 100 unit/mL (3 mL) subcutaneous pen  05/03/18  [provider]    ROS: All other systems have been reviewed and were otherwise negative with the exception of those mentioned in the HPI and as above.  Physical Exam: General: Alert, no acute distress Cardiovascular: No pedal edema Respiratory: No cyanosis, no use of accessory musculature GI: No organomegaly, abdomen is soft and non-tender Skin: No lesions in the area of chief complaint Neurologic: Sensation intact distally Psychiatric: Patient is competent for consent with normal mood and affect Lymphatic: No axillary or cervical lymphadenopathy  MUSCULOSKELETAL:  Right shoulder: Active forward elevation to about 50, passive to 50 with significant crepitus.  She has essentially 5/5 cuff strength.  Internal rotation to the front pocket at best.  External rotation to 0.    Imaging: CT scan of the right shoulder demonstrates severe end stage glenohumeral joint osteoarthritis  Assessment: DJD RIGHT  SHOULDER  Plan: Plan for Procedure(s): TOTAL SHOULDER ARTHROPLASTY  The risks benefits and alternatives were discussed with the patient including but not limited to the risks of nonoperative treatment, versus surgical intervention including infection, bleeding, nerve injury,  blood clots, cardiopulmonary complications, morbidity, mortality, among others, and they were willing to proceed.   We additionally specifically discussed risks of axillary nerve injury, infection, periprosthetic fracture, continued pain and longevity of implants prior to beginning procedure.    Patient will be closely monitored in PACU for medical stabilization and pain control. If found stable in PACU, patient may be discharged home with outpatient follow-up. If any concerns regarding patient's stabilization patient will be admitted for observation after surgery. The patient is planning to be discharged home with outpatient PT.   The patient acknowledged the explanation, agreed to proceed with the plan and consent was signed.   Operative Plan: Right reverse total shoulder arthroplasty  Discharge Medications: Tylenol, Celebrex, Oxycodone, Zofran, Omeprazole DVT Prophylaxis: Aspirin BID Physical Therapy: Outpatient PT Special Discharge needs: Sling. IceMan.    Vernetta Honey, PA-C  09/16/2020 8:55 PM

## 2020-09-17 ENCOUNTER — Ambulatory Visit (HOSPITAL_BASED_OUTPATIENT_CLINIC_OR_DEPARTMENT_OTHER): Payer: Medicare Other | Admitting: Certified Registered"

## 2020-09-17 ENCOUNTER — Ambulatory Visit (HOSPITAL_COMMUNITY): Payer: Medicare Other

## 2020-09-17 ENCOUNTER — Inpatient Hospital Stay (HOSPITAL_BASED_OUTPATIENT_CLINIC_OR_DEPARTMENT_OTHER)
Admission: RE | Admit: 2020-09-17 | Discharge: 2020-09-19 | DRG: 981 | Disposition: A | Discharge status: Home Health | Payer: Medicare Other | Attending: Internal Medicine | Admitting: Internal Medicine

## 2020-09-17 ENCOUNTER — Other Ambulatory Visit: Payer: Self-pay

## 2020-09-17 ENCOUNTER — Encounter (HOSPITAL_COMMUNITY)
Admission: RE | Disposition: A | Discharge status: Home Health | Payer: Self-pay | Source: Home / Self Care | Attending: Internal Medicine

## 2020-09-17 ENCOUNTER — Observation Stay (HOSPITAL_COMMUNITY): Payer: Medicare Other

## 2020-09-17 ENCOUNTER — Encounter (HOSPITAL_BASED_OUTPATIENT_CLINIC_OR_DEPARTMENT_OTHER): Payer: Self-pay | Admitting: Orthopaedic Surgery

## 2020-09-17 DIAGNOSIS — Z79899 Other long term (current) drug therapy: Secondary | ICD-10-CM

## 2020-09-17 DIAGNOSIS — I10 Essential (primary) hypertension: Secondary | ICD-10-CM | POA: Diagnosis not present

## 2020-09-17 DIAGNOSIS — J9601 Acute respiratory failure with hypoxia: Secondary | ICD-10-CM | POA: Diagnosis present

## 2020-09-17 DIAGNOSIS — Z87891 Personal history of nicotine dependence: Secondary | ICD-10-CM | POA: Diagnosis not present

## 2020-09-17 DIAGNOSIS — J95811 Postprocedural pneumothorax: Principal | ICD-10-CM | POA: Diagnosis present

## 2020-09-17 DIAGNOSIS — D72828 Other elevated white blood cell count: Secondary | ICD-10-CM | POA: Diagnosis not present

## 2020-09-17 DIAGNOSIS — Z6837 Body mass index (BMI) 37.0-37.9, adult: Secondary | ICD-10-CM

## 2020-09-17 DIAGNOSIS — E1165 Type 2 diabetes mellitus with hyperglycemia: Secondary | ICD-10-CM | POA: Diagnosis present

## 2020-09-17 DIAGNOSIS — I517 Cardiomegaly: Secondary | ICD-10-CM | POA: Diagnosis not present

## 2020-09-17 DIAGNOSIS — D72829 Elevated white blood cell count, unspecified: Secondary | ICD-10-CM | POA: Diagnosis not present

## 2020-09-17 DIAGNOSIS — E785 Hyperlipidemia, unspecified: Secondary | ICD-10-CM | POA: Diagnosis not present

## 2020-09-17 DIAGNOSIS — K219 Gastro-esophageal reflux disease without esophagitis: Secondary | ICD-10-CM | POA: Diagnosis not present

## 2020-09-17 DIAGNOSIS — G8918 Other acute postprocedural pain: Secondary | ICD-10-CM | POA: Diagnosis not present

## 2020-09-17 DIAGNOSIS — Z20822 Contact with and (suspected) exposure to covid-19: Secondary | ICD-10-CM | POA: Diagnosis present

## 2020-09-17 DIAGNOSIS — J9811 Atelectasis: Secondary | ICD-10-CM | POA: Diagnosis not present

## 2020-09-17 DIAGNOSIS — Z09 Encounter for follow-up examination after completed treatment for conditions other than malignant neoplasm: Secondary | ICD-10-CM

## 2020-09-17 DIAGNOSIS — E1169 Type 2 diabetes mellitus with other specified complication: Secondary | ICD-10-CM | POA: Diagnosis not present

## 2020-09-17 DIAGNOSIS — J939 Pneumothorax, unspecified: Secondary | ICD-10-CM

## 2020-09-17 DIAGNOSIS — M109 Gout, unspecified: Secondary | ICD-10-CM | POA: Diagnosis present

## 2020-09-17 DIAGNOSIS — E669 Obesity, unspecified: Secondary | ICD-10-CM | POA: Diagnosis not present

## 2020-09-17 DIAGNOSIS — Z7982 Long term (current) use of aspirin: Secondary | ICD-10-CM

## 2020-09-17 DIAGNOSIS — Z8739 Personal history of other diseases of the musculoskeletal system and connective tissue: Secondary | ICD-10-CM

## 2020-09-17 DIAGNOSIS — E119 Type 2 diabetes mellitus without complications: Secondary | ICD-10-CM | POA: Diagnosis present

## 2020-09-17 DIAGNOSIS — M19011 Primary osteoarthritis, right shoulder: Secondary | ICD-10-CM | POA: Diagnosis present

## 2020-09-17 DIAGNOSIS — Z96611 Presence of right artificial shoulder joint: Secondary | ICD-10-CM | POA: Diagnosis not present

## 2020-09-17 DIAGNOSIS — R0602 Shortness of breath: Secondary | ICD-10-CM | POA: Diagnosis not present

## 2020-09-17 DIAGNOSIS — Z794 Long term (current) use of insulin: Secondary | ICD-10-CM

## 2020-09-17 DIAGNOSIS — Z471 Aftercare following joint replacement surgery: Secondary | ICD-10-CM | POA: Diagnosis not present

## 2020-09-17 HISTORY — DX: Gout, unspecified: M10.9

## 2020-09-17 HISTORY — DX: Gastro-esophageal reflux disease without esophagitis: K21.9

## 2020-09-17 HISTORY — DX: Hyperlipidemia, unspecified: E78.5

## 2020-09-17 HISTORY — DX: Essential (primary) hypertension: I10

## 2020-09-17 HISTORY — PX: REVERSE SHOULDER ARTHROPLASTY: SHX5054

## 2020-09-17 LAB — COMPREHENSIVE METABOLIC PANEL
ALT: 34 U/L (ref 0–44)
AST: 36 U/L (ref 15–41)
Albumin: 3.4 g/dL — ABNORMAL LOW (ref 3.5–5.0)
Alkaline Phosphatase: 113 U/L (ref 38–126)
Anion gap: 12 (ref 5–15)
BUN: 25 mg/dL — ABNORMAL HIGH (ref 8–23)
CO2: 24 mmol/L (ref 22–32)
Calcium: 9.2 mg/dL (ref 8.9–10.3)
Chloride: 99 mmol/L (ref 98–111)
Creatinine, Ser: 1.16 mg/dL — ABNORMAL HIGH (ref 0.44–1.00)
GFR, Estimated: 52 mL/min — ABNORMAL LOW (ref >60)
Glucose, Bld: 224 mg/dL — ABNORMAL HIGH (ref 70–99)
Potassium: 3.5 mmol/L (ref 3.5–5.1)
Sodium: 135 mmol/L (ref 135–145)
Total Bilirubin: 0.4 mg/dL (ref 0.3–1.2)
Total Protein: 7.8 g/dL (ref 6.5–8.1)

## 2020-09-17 LAB — CBC
HCT: 37.4 % (ref 36.0–46.0)
Hemoglobin: 11.7 g/dL — ABNORMAL LOW (ref 12.0–15.0)
MCH: 27.5 pg (ref 26.0–34.0)
MCHC: 31.3 g/dL (ref 30.0–36.0)
MCV: 88 fL (ref 80.0–100.0)
Platelets: 285 10*3/uL (ref 150–400)
RBC: 4.25 MIL/uL (ref 3.87–5.11)
RDW: 15.9 % — ABNORMAL HIGH (ref 11.5–15.5)
WBC: 16.2 10*3/uL — ABNORMAL HIGH (ref 4.0–10.5)
nRBC: 0 % (ref 0.0–0.2)

## 2020-09-17 LAB — HIV ANTIBODY (ROUTINE TESTING W REFLEX): HIV Screen 4th Generation wRfx: NONREACTIVE

## 2020-09-17 LAB — GLUCOSE, CAPILLARY
Glucose-Capillary: 161 mg/dL — ABNORMAL HIGH (ref 70–99)
Glucose-Capillary: 208 mg/dL — ABNORMAL HIGH (ref 70–99)
Glucose-Capillary: 202 mg/dL — ABNORMAL HIGH (ref 70–99)
Glucose-Capillary: 234 mg/dL — ABNORMAL HIGH (ref 70–99)

## 2020-09-17 SURGERY — ARTHROPLASTY, SHOULDER, TOTAL, REVERSE
Anesthesia: General | Site: Shoulder | Laterality: Right

## 2020-09-17 MED ORDER — VANCOMYCIN HCL 1000 MG IV SOLR
INTRAVENOUS | Status: DC | PRN
  Administered 2020-09-17: 1000 mg

## 2020-09-17 MED ORDER — ENOXAPARIN SODIUM 30 MG/0.3ML IJ SOSY
30.0000 mg | PREFILLED_SYRINGE | INTRAMUSCULAR | Status: DC
  Administered 2020-09-18 – 2020-09-19 (×2): 30 mg via SUBCUTANEOUS
  Filled 2020-09-17 (×2): qty 0.3

## 2020-09-17 MED ORDER — INSULIN GLARGINE 100 UNIT/ML ~~LOC~~ SOLN
42.0000 [IU] | Freq: Every day | SUBCUTANEOUS | Status: DC
  Administered 2020-09-17 – 2020-09-18 (×2): 42 [IU] via SUBCUTANEOUS
  Filled 2020-09-17 (×3): qty 0.42

## 2020-09-17 MED ORDER — FENTANYL CITRATE (PF) 100 MCG/2ML IJ SOLN
INTRAMUSCULAR | Status: AC
  Filled 2020-09-17: qty 2

## 2020-09-17 MED ORDER — OXYCODONE HCL 5 MG PO TABS: ORAL_TABLET | ORAL | 0 refills | Status: AC

## 2020-09-17 MED ORDER — FENTANYL CITRATE (PF) 100 MCG/2ML IJ SOLN
50.0000 ug | Freq: Once | INTRAMUSCULAR | Status: AC
  Administered 2020-09-17: 50 ug via INTRAVENOUS

## 2020-09-17 MED ORDER — ACETAMINOPHEN 325 MG PO TABS: 650.0000 mg | ORAL_TABLET | Freq: Four times a day (QID) | ORAL | Status: DC | PRN

## 2020-09-17 MED ORDER — HYDROCODONE-ACETAMINOPHEN 5-325 MG PO TABS
1.0000 | ORAL_TABLET | Freq: Four times a day (QID) | ORAL | Status: DC | PRN
  Administered 2020-09-17: 1 via ORAL
  Filled 2020-09-17: qty 1

## 2020-09-17 MED ORDER — VANCOMYCIN HCL 1000 MG IV SOLR
INTRAVENOUS | Status: AC
  Filled 2020-09-17: qty 1000

## 2020-09-17 MED ORDER — COLCHICINE-PROBENECID 0.5-500 MG PO TABS
1.0000 | ORAL_TABLET | Freq: Every day | ORAL | Status: DC
  Administered 2020-09-18 – 2020-09-19 (×2): 1 via ORAL
  Filled 2020-09-17 (×2): qty 1

## 2020-09-17 MED ORDER — BUPIVACAINE HCL (PF) 0.5 % IJ SOLN
INTRAMUSCULAR | Status: DC | PRN
  Administered 2020-09-17: 12 mL via PERINEURAL

## 2020-09-17 MED ORDER — PHENYLEPHRINE 40 MCG/ML (10ML) SYRINGE FOR IV PUSH (FOR BLOOD PRESSURE SUPPORT)
PREFILLED_SYRINGE | INTRAVENOUS | Status: AC
  Filled 2020-09-17: qty 10

## 2020-09-17 MED ORDER — SODIUM CHLORIDE 0.9% FLUSH
3.0000 mL | Freq: Two times a day (BID) | INTRAVENOUS | Status: DC
  Administered 2020-09-17 – 2020-09-18 (×3): 3 mL via INTRAVENOUS

## 2020-09-17 MED ORDER — HYDROCODONE-ACETAMINOPHEN 5-325 MG PO TABS
1.0000 | ORAL_TABLET | Freq: Four times a day (QID) | ORAL | Status: DC | PRN
Start: 2020-09-17 — End: 2020-09-19
  Administered 2020-09-18 (×3): 1 via ORAL
  Filled 2020-09-17 (×3): qty 1

## 2020-09-17 MED ORDER — DEXAMETHASONE SODIUM PHOSPHATE 10 MG/ML IJ SOLN
INTRAMUSCULAR | Status: AC
  Filled 2020-09-17: qty 1

## 2020-09-17 MED ORDER — BENZONATATE 100 MG PO CAPS
100.0000 mg | ORAL_CAPSULE | Freq: Three times a day (TID) | ORAL | Status: DC | PRN
  Administered 2020-09-17: 100 mg via ORAL
  Filled 2020-09-17: qty 1

## 2020-09-17 MED ORDER — GABAPENTIN 300 MG PO CAPS
300.0000 mg | ORAL_CAPSULE | Freq: Once | ORAL | Status: AC
  Administered 2020-09-17: 300 mg via ORAL

## 2020-09-17 MED ORDER — ACETAMINOPHEN 500 MG PO TABS
1000.0000 mg | ORAL_TABLET | Freq: Once | ORAL | Status: AC
  Administered 2020-09-17: 1000 mg via ORAL

## 2020-09-17 MED ORDER — TRAZODONE HCL 50 MG PO TABS: 50.0000 mg | ORAL_TABLET | Freq: Every evening | ORAL | Status: DC | PRN

## 2020-09-17 MED ORDER — TRIAMCINOLONE ACETONIDE 0.1 % EX CREA: 1.0000 "application " | TOPICAL_CREAM | Freq: Three times a day (TID) | CUTANEOUS | Status: DC | PRN

## 2020-09-17 MED ORDER — INSULIN ASPART 100 UNIT/ML IJ SOLN
0.0000 [IU] | Freq: Three times a day (TID) | INTRAMUSCULAR | Status: DC
Start: 2020-09-17 — End: 2020-09-19
  Administered 2020-09-17: 5 [IU] via SUBCUTANEOUS
  Administered 2020-09-18: 8 [IU] via SUBCUTANEOUS
  Administered 2020-09-18: 5 [IU] via SUBCUTANEOUS
  Administered 2020-09-18: 15 [IU] via SUBCUTANEOUS
  Administered 2020-09-19 (×2): 5 [IU] via SUBCUTANEOUS

## 2020-09-17 MED ORDER — LACTATED RINGERS IV SOLN: INTRAVENOUS | Status: DC

## 2020-09-17 MED ORDER — DIPHENHYDRAMINE HCL 25 MG PO CAPS: 50.0000 mg | ORAL_CAPSULE | Freq: Every evening | ORAL | Status: DC | PRN

## 2020-09-17 MED ORDER — DIPHENHYDRAMINE HCL 12.5 MG/5ML PO ELIX
12.5000 mg | ORAL_SOLUTION | ORAL | Status: DC | PRN
  Filled 2020-09-17: qty 10

## 2020-09-17 MED ORDER — SODIUM CHLORIDE 0.9 % IV SOLN
INTRAVENOUS | Status: AC | PRN
  Administered 2020-09-17: 1000 mL

## 2020-09-17 MED ORDER — CHLORHEXIDINE GLUCONATE 0.12 % MT SOLN
15.0000 mL | Freq: Two times a day (BID) | OROMUCOSAL | Status: DC
  Filled 2020-09-17: qty 15

## 2020-09-17 MED ORDER — BUPIVACAINE HCL (PF) 0.25 % IJ SOLN
INTRAMUSCULAR | Status: AC
  Filled 2020-09-17: qty 30

## 2020-09-17 MED ORDER — ONDANSETRON HCL 4 MG/2ML IJ SOLN: 4.0000 mg | Freq: Four times a day (QID) | INTRAMUSCULAR | Status: DC | PRN

## 2020-09-17 MED ORDER — ALBUTEROL SULFATE (2.5 MG/3ML) 0.083% IN NEBU: 2.5000 mg | INHALATION_SOLUTION | Freq: Four times a day (QID) | RESPIRATORY_TRACT | Status: DC | PRN

## 2020-09-17 MED ORDER — TRANEXAMIC ACID-NACL 1000-0.7 MG/100ML-% IV SOLN
INTRAVENOUS | Status: AC
  Filled 2020-09-17: qty 100

## 2020-09-17 MED ORDER — PANTOPRAZOLE SODIUM 40 MG PO TBEC
40.0000 mg | DELAYED_RELEASE_TABLET | Freq: Every day | ORAL | Status: DC
  Administered 2020-09-17 – 2020-09-19 (×3): 40 mg via ORAL
  Filled 2020-09-17 (×3): qty 1

## 2020-09-17 MED ORDER — HYDROCODONE-ACETAMINOPHEN 7.5-325 MG PO TABS
1.0000 | ORAL_TABLET | ORAL | Status: DC | PRN
  Administered 2020-09-18 – 2020-09-19 (×3): 1 via ORAL
  Filled 2020-09-17 (×4): qty 1

## 2020-09-17 MED ORDER — MORPHINE SULFATE (PF) 2 MG/ML IV SOLN: 0.5000 mg | INTRAVENOUS | Status: DC | PRN

## 2020-09-17 MED ORDER — ASPIRIN 81 MG PO CHEW: 81.0000 mg | CHEWABLE_TABLET | Freq: Two times a day (BID) | ORAL | 0 refills | Status: AC

## 2020-09-17 MED ORDER — CEFAZOLIN SODIUM-DEXTROSE 2-4 GM/100ML-% IV SOLN
2.0000 g | Freq: Four times a day (QID) | INTRAVENOUS | Status: AC
  Administered 2020-09-17 – 2020-09-18 (×3): 2 g via INTRAVENOUS
  Filled 2020-09-17 (×3): qty 100

## 2020-09-17 MED ORDER — ACETAMINOPHEN 650 MG RE SUPP: 650.0000 mg | Freq: Four times a day (QID) | RECTAL | Status: DC | PRN

## 2020-09-17 MED ORDER — CEFAZOLIN SODIUM-DEXTROSE 2-4 GM/100ML-% IV SOLN
2.0000 g | INTRAVENOUS | Status: AC
  Administered 2020-09-17: 2 g via INTRAVENOUS

## 2020-09-17 MED ORDER — LIDOCAINE HCL (PF) 2 % IJ SOLN
INTRAMUSCULAR | Status: AC
  Filled 2020-09-17: qty 5

## 2020-09-17 MED ORDER — CEFAZOLIN SODIUM-DEXTROSE 2-4 GM/100ML-% IV SOLN
INTRAVENOUS | Status: AC
  Filled 2020-09-17: qty 100

## 2020-09-17 MED ORDER — SUGAMMADEX SODIUM 200 MG/2ML IV SOLN
INTRAVENOUS | Status: DC | PRN
  Administered 2020-09-17: 100 mg via INTRAVENOUS
  Administered 2020-09-17: 200 mg via INTRAVENOUS

## 2020-09-17 MED ORDER — ACETAMINOPHEN 160 MG/5ML PO SOLN: 325.0000 mg | ORAL | Status: DC | PRN

## 2020-09-17 MED ORDER — LACTATED RINGERS IV BOLUS: 500.0000 mL | Freq: Once | INTRAVENOUS | Status: AC

## 2020-09-17 MED ORDER — ACETAMINOPHEN 325 MG PO TABS: 325.0000 mg | ORAL_TABLET | ORAL | Status: DC | PRN

## 2020-09-17 MED ORDER — ONDANSETRON HCL 4 MG/2ML IJ SOLN
INTRAMUSCULAR | Status: AC
  Filled 2020-09-17: qty 2

## 2020-09-17 MED ORDER — ONDANSETRON HCL 4 MG PO TABS: 4.0000 mg | ORAL_TABLET | Freq: Three times a day (TID) | ORAL | 0 refills | Status: AC | PRN

## 2020-09-17 MED ORDER — DOCUSATE SODIUM 100 MG PO CAPS
100.0000 mg | ORAL_CAPSULE | Freq: Two times a day (BID) | ORAL | Status: DC
  Administered 2020-09-17 – 2020-09-19 (×4): 100 mg via ORAL
  Filled 2020-09-17 (×4): qty 1

## 2020-09-17 MED ORDER — MIDAZOLAM HCL 2 MG/2ML IJ SOLN
2.0000 mg | Freq: Once | INTRAMUSCULAR | Status: AC
  Administered 2020-09-17: 2 mg via INTRAVENOUS

## 2020-09-17 MED ORDER — LACTATED RINGERS IV BOLUS: 250.0000 mL | Freq: Once | INTRAVENOUS | Status: AC

## 2020-09-17 MED ORDER — ROCURONIUM BROMIDE 10 MG/ML (PF) SYRINGE
PREFILLED_SYRINGE | INTRAVENOUS | Status: AC
  Filled 2020-09-17: qty 10

## 2020-09-17 MED ORDER — AMISULPRIDE (ANTIEMETIC) 5 MG/2ML IV SOLN
INTRAVENOUS | Status: AC
  Filled 2020-09-17: qty 2

## 2020-09-17 MED ORDER — CELECOXIB 100 MG PO CAPS: 100.0000 mg | ORAL_CAPSULE | Freq: Two times a day (BID) | ORAL | 0 refills | Status: AC

## 2020-09-17 MED ORDER — PROMETHAZINE HCL 25 MG/ML IJ SOLN: 6.2500 mg | INTRAMUSCULAR | Status: DC | PRN

## 2020-09-17 MED ORDER — OXYCODONE HCL 5 MG/5ML PO SOLN: 5.0000 mg | Freq: Once | ORAL | Status: DC | PRN

## 2020-09-17 MED ORDER — LIDOCAINE HCL (CARDIAC) PF 100 MG/5ML IV SOSY
PREFILLED_SYRINGE | INTRAVENOUS | Status: DC | PRN
  Administered 2020-09-17: 40 mg via INTRAVENOUS

## 2020-09-17 MED ORDER — ACETAMINOPHEN 10 MG/ML IV SOLN: 1000.0000 mg | Freq: Once | INTRAVENOUS | Status: DC | PRN

## 2020-09-17 MED ORDER — PROPOFOL 500 MG/50ML IV EMUL
INTRAVENOUS | Status: AC
  Filled 2020-09-17: qty 50

## 2020-09-17 MED ORDER — ALBUTEROL SULFATE (2.5 MG/3ML) 0.083% IN NEBU
2.5000 mg | INHALATION_SOLUTION | Freq: Four times a day (QID) | RESPIRATORY_TRACT | Status: DC | PRN
  Administered 2020-09-17: 2.5 mg via RESPIRATORY_TRACT

## 2020-09-17 MED ORDER — NAPROXEN SOD-DIPHENHYDRAMINE 220-25 MG PO TABS: 2.0000 | ORAL_TABLET | Freq: Every evening | ORAL | Status: DC | PRN

## 2020-09-17 MED ORDER — ALBUTEROL SULFATE (2.5 MG/3ML) 0.083% IN NEBU
INHALATION_SOLUTION | RESPIRATORY_TRACT | Status: AC
  Filled 2020-09-17: qty 3

## 2020-09-17 MED ORDER — AMISULPRIDE (ANTIEMETIC) 5 MG/2ML IV SOLN
10.0000 mg | Freq: Once | INTRAVENOUS | Status: AC | PRN
  Administered 2020-09-17: 10 mg via INTRAVENOUS

## 2020-09-17 MED ORDER — ONDANSETRON HCL 4 MG PO TABS: 4.0000 mg | ORAL_TABLET | Freq: Four times a day (QID) | ORAL | Status: DC | PRN

## 2020-09-17 MED ORDER — TRAMADOL HCL 50 MG PO TABS
50.0000 mg | ORAL_TABLET | ORAL | Status: DC | PRN
Start: 2020-09-17 — End: 2020-09-17

## 2020-09-17 MED ORDER — PHENYLEPHRINE 40 MCG/ML (10ML) SYRINGE FOR IV PUSH (FOR BLOOD PRESSURE SUPPORT)
PREFILLED_SYRINGE | INTRAVENOUS | Status: DC | PRN
  Administered 2020-09-17 (×3): 80 ug via INTRAVENOUS

## 2020-09-17 MED ORDER — PROPOFOL 10 MG/ML IV BOLUS
INTRAVENOUS | Status: DC | PRN
  Administered 2020-09-17: 150 mg via INTRAVENOUS

## 2020-09-17 MED ORDER — TRAMADOL HCL 50 MG PO TABS
50.0000 mg | ORAL_TABLET | ORAL | Status: DC | PRN
Start: 2020-09-17 — End: 2020-09-19
  Administered 2020-09-17 – 2020-09-19 (×5): 50 mg via ORAL
  Filled 2020-09-17 (×5): qty 1

## 2020-09-17 MED ORDER — ACETAMINOPHEN 500 MG PO TABS: 1000.0000 mg | ORAL_TABLET | Freq: Three times a day (TID) | ORAL | 0 refills | Status: AC

## 2020-09-17 MED ORDER — TRANEXAMIC ACID-NACL 1000-0.7 MG/100ML-% IV SOLN
1000.0000 mg | INTRAVENOUS | Status: AC
  Administered 2020-09-17: 1000 mg via INTRAVENOUS

## 2020-09-17 MED ORDER — BUPIVACAINE LIPOSOME 1.3 % IJ SUSP
INTRAMUSCULAR | Status: DC | PRN
  Administered 2020-09-17: 10 mL via PERINEURAL

## 2020-09-17 MED ORDER — ROCURONIUM BROMIDE 100 MG/10ML IV SOLN
INTRAVENOUS | Status: DC | PRN
  Administered 2020-09-17: 50 mg via INTRAVENOUS

## 2020-09-17 MED ORDER — OMEPRAZOLE 20 MG PO CPDR: 20.0000 mg | DELAYED_RELEASE_CAPSULE | Freq: Every day | ORAL | 0 refills | Status: DC

## 2020-09-17 MED ORDER — DEXAMETHASONE SODIUM PHOSPHATE 4 MG/ML IJ SOLN
INTRAMUSCULAR | Status: DC | PRN
  Administered 2020-09-17: 5 mg via INTRAVENOUS

## 2020-09-17 MED ORDER — OXYCODONE HCL 5 MG PO TABS: 5.0000 mg | ORAL_TABLET | Freq: Once | ORAL | Status: DC | PRN

## 2020-09-17 MED ORDER — MIDAZOLAM HCL 2 MG/2ML IJ SOLN
INTRAMUSCULAR | Status: AC
  Filled 2020-09-17: qty 2

## 2020-09-17 MED ORDER — GABAPENTIN 300 MG PO CAPS
ORAL_CAPSULE | ORAL | Status: AC
  Filled 2020-09-17: qty 1

## 2020-09-17 MED ORDER — FENTANYL CITRATE (PF) 100 MCG/2ML IJ SOLN: 25.0000 ug | INTRAMUSCULAR | Status: DC | PRN

## 2020-09-17 MED ORDER — METHYLPREDNISOLONE ACETATE 80 MG/ML IJ SUSP
INTRAMUSCULAR | Status: AC
  Filled 2020-09-17: qty 1

## 2020-09-17 MED ORDER — ONDANSETRON HCL 4 MG/2ML IJ SOLN
INTRAMUSCULAR | Status: DC | PRN
  Administered 2020-09-17: 4 mg via INTRAVENOUS

## 2020-09-17 MED ORDER — MENTHOL 3 MG MT LOZG
1.0000 | LOZENGE | OROMUCOSAL | Status: DC | PRN
  Filled 2020-09-17: qty 9

## 2020-09-17 MED ORDER — MORPHINE SULFATE (PF) 2 MG/ML IV SOLN
2.0000 mg | Freq: Once | INTRAVENOUS | Status: AC
Start: 2020-09-17 — End: 2020-09-17
  Administered 2020-09-17: 2 mg via INTRAVENOUS
  Filled 2020-09-17: qty 1

## 2020-09-17 MED ORDER — METHYLPREDNISOLONE ACETATE 80 MG/ML IJ SUSP
INTRAMUSCULAR | Status: DC | PRN
  Administered 2020-09-17: 3 mL via INTRA_ARTICULAR

## 2020-09-17 MED ORDER — ORAL CARE MOUTH RINSE
15.0000 mL | Freq: Two times a day (BID) | OROMUCOSAL | Status: DC
  Administered 2020-09-17 – 2020-09-19 (×2): 15 mL via OROMUCOSAL

## 2020-09-17 MED ORDER — PHENOL 1.4 % MT LIQD: 1.0000 | OROMUCOSAL | Status: DC | PRN

## 2020-09-17 MED ORDER — ACETAMINOPHEN 500 MG PO TABS
ORAL_TABLET | ORAL | Status: AC
  Filled 2020-09-17: qty 2

## 2020-09-17 SURGICAL SUPPLY — 75 items
AID PSTN UNV HD RSTRNT DISP (MISCELLANEOUS) ×1
APL PRP STRL LF DISP 70% ISPRP (MISCELLANEOUS) ×1
AUGMENT BASEPLATE 25M 35D WEDG (Joint) IMPLANT
BASEPLATE AUGMNT 25M 35D WEDGE (Joint) ×2 IMPLANT
BIT DRILL 3.2 PERIPHERAL SCREW (BIT) ×1 IMPLANT
BLADE HEX COATED 2.75 (ELECTRODE) IMPLANT
BLADE SAW SAG 73X25 THK (BLADE) ×1
BLADE SAW SGTL 73X25 THK (BLADE) ×1 IMPLANT
BLADE SURG 10 STRL SS (BLADE) IMPLANT
BLADE SURG 15 STRL LF DISP TIS (BLADE) IMPLANT
BLADE SURG 15 STRL SS (BLADE)
BNDG COHESIVE 4X5 TAN STRL (GAUZE/BANDAGES/DRESSINGS) IMPLANT
BSPLAT GLND 35D HLF WDG 25 (Joint) ×1 IMPLANT
CHLORAPREP W/TINT 26 (MISCELLANEOUS) ×2 IMPLANT
CLSR STERI-STRIP ANTIMIC 1/2X4 (GAUZE/BANDAGES/DRESSINGS) ×2 IMPLANT
COOLER ICEMAN CLASSIC (MISCELLANEOUS) ×2 IMPLANT
COVER BACK TABLE 60X90IN (DRAPES) ×2 IMPLANT
COVER MAYO STAND STRL (DRAPES) ×2 IMPLANT
COVER WAND RF STERILE (DRAPES) IMPLANT
DECANTER SPIKE VIAL GLASS SM (MISCELLANEOUS) IMPLANT
DRAPE IMP U-DRAPE 54X76 (DRAPES) ×2 IMPLANT
DRAPE INCISE IOBAN 66X45 STRL (DRAPES) ×2 IMPLANT
DRAPE U-SHAPE 76X120 STRL (DRAPES) ×4 IMPLANT
DRSG AQUACEL AG ADV 3.5X 6 (GAUZE/BANDAGES/DRESSINGS) ×2 IMPLANT
ELECT BLADE 4.0 EZ CLEAN MEGAD (MISCELLANEOUS) ×2
ELECT REM PT RETURN 9FT ADLT (ELECTROSURGICAL) ×2
ELECTRODE BLDE 4.0 EZ CLN MEGD (MISCELLANEOUS) ×1 IMPLANT
ELECTRODE REM PT RTRN 9FT ADLT (ELECTROSURGICAL) ×1 IMPLANT
FACESHIELD WRAPAROUND (MASK) ×2 IMPLANT
FACESHIELD WRAPAROUND OR TEAM (MASK) IMPLANT
GLENOSPHERE REV SHOULDER 36 (Joint) ×1 IMPLANT
GLOVE SRG 8 PF TXTR STRL LF DI (GLOVE) ×1 IMPLANT
GLOVE SURG ENC MOIS LTX SZ6.5 (GLOVE) ×4 IMPLANT
GLOVE SURG LTX SZ8 (GLOVE) ×3 IMPLANT
GLOVE SURG UNDER POLY LF SZ6.5 (GLOVE) ×2 IMPLANT
GLOVE SURG UNDER POLY LF SZ7 (GLOVE) ×1 IMPLANT
GLOVE SURG UNDER POLY LF SZ8 (GLOVE) ×2
GOWN STRL REUS W/ TWL LRG LVL3 (GOWN DISPOSABLE) ×2 IMPLANT
GOWN STRL REUS W/TWL LRG LVL3 (GOWN DISPOSABLE) ×4
GOWN STRL REUS W/TWL XL LVL3 (GOWN DISPOSABLE) ×2 IMPLANT
GUIDEWIRE GLENOID 2.5X220 (WIRE) ×1 IMPLANT
HANDPIECE INTERPULSE COAX TIP (DISPOSABLE) ×2
IMPL REVERSE SHOULDER 0X3.5 (Shoulder) IMPLANT
IMPLANT REVERSE SHOULDER 0X3.5 (Shoulder) ×2 IMPLANT
INSERT HUMERAL 36X6MM 12.5DEG (Insert) ×1 IMPLANT
KIT LEG STABILIZATION (KITS) ×1 IMPLANT
KIT STABILIZATION SHOULDER (MISCELLANEOUS) ×1 IMPLANT
MANIFOLD NEPTUNE II (INSTRUMENTS) ×2 IMPLANT
NS IRRIG 1000ML POUR BTL (IV SOLUTION) ×1 IMPLANT
PACK BASIN DAY SURGERY FS (CUSTOM PROCEDURE TRAY) ×2 IMPLANT
PACK SHOULDER (CUSTOM PROCEDURE TRAY) ×2 IMPLANT
PAD COLD SHLDR WRAP-ON (PAD) ×2 IMPLANT
PAD ORTHO SHOULDER 7X19 LRG (SOFTGOODS) ×2 IMPLANT
PENCIL SMOKE EVACUATOR (MISCELLANEOUS) ×1 IMPLANT
RESTRAINT HEAD UNIVERSAL NS (MISCELLANEOUS) ×2 IMPLANT
SCREW 5.0X18 (Screw) ×2 IMPLANT
SCREW BONE 6.5 OD 30 NON BIO (Screw) ×1 IMPLANT
SCREW PERIPHERAL 30 (Screw) ×1 IMPLANT
SCREW PERIPHERAL 5.0X34 (Screw) ×1 IMPLANT
SET HNDPC FAN SPRY TIP SCT (DISPOSABLE) ×1 IMPLANT
SHEET MEDIUM DRAPE 40X70 STRL (DRAPES) ×2 IMPLANT
SLEEVE SCD COMPRESS KNEE MED (STOCKING) ×2 IMPLANT
SPONGE LAP 18X18 RF (DISPOSABLE) IMPLANT
STEM HUMERAL SZ2B STND 70 PTC (Stem) ×2 IMPLANT
STEM HUMERAL SZ2BSTD 70 PTC (Stem) IMPLANT
SUT ETHIBOND 2 V 37 (SUTURE) ×2 IMPLANT
SUT ETHIBOND NAB CT1 #1 30IN (SUTURE) ×2 IMPLANT
SUT FIBERWIRE #5 38 CONV NDL (SUTURE) ×8
SUT MNCRL AB 4-0 PS2 18 (SUTURE) ×1 IMPLANT
SUT VIC AB 3-0 SH 27 (SUTURE) ×2
SUT VIC AB 3-0 SH 27X BRD (SUTURE) ×1 IMPLANT
SUTURE FIBERWR #5 38 CONV NDL (SUTURE) ×3 IMPLANT
SYR BULB IRRIG 60ML STRL (SYRINGE) IMPLANT
TOWEL GREEN STERILE FF (TOWEL DISPOSABLE) ×6 IMPLANT
TUBE SUCTION HIGH CAP CLEAR NV (SUCTIONS) ×2 IMPLANT

## 2020-09-17 NOTE — Anesthesia Postprocedure Evaluation (Addendum)
Anesthesia Post Note  Patient: Sheila [name]  Procedure(s) Performed: REVERSE SHOULDER ARTHROPLASTY (Right Shoulder)     Patient location during evaluation: PACU Anesthesia Type: General Level of consciousness: awake and alert Pain management: pain level controlled (0/10 pain score) Vital Signs Assessment: post-procedure vital signs reviewed and stable Respiratory status: spontaneous breathing, respiratory function stable and patient connected to face mask oxygen Cardiovascular status: blood pressure returned to baseline and stable Postop Assessment: no apparent nausea or vomiting Anesthetic complications: no Comments: Post op PTX. Possible etiology: trauma to airway bronchus via bougie vs. ISB. Transferred to [facility] for chest tube placement and airway evaluation by Dr. Cliffton Asters. Hospitalist will admit, CT and Ortho will follow. See intraop notes for more info..    No complications documented.  Last Vitals:  Vitals:   09/17/20 1300 09/17/20 1342  BP: (!) 149/77 (!) 162/57  Pulse: 68 88  Resp: 15 (!) 23  Temp:  (!) 36.4 C  SpO2: 99% 95%    Last Pain:  Vitals:   09/17/20 1342  TempSrc: Oral  PainSc:                  [name]

## 2020-09-17 NOTE — Consult Note (Signed)
[address]       [place] [id number]             [phone]                    Tamme Mozingo [facility] Medical Record #[id number] Date of Birth: 1951/01/31  Referring: No ref. provider found Primary Care: Ignatius Specking, MD Primary Cardiologist: None  Chief Complaint:   No chief complaint on file.   History of Present Illness:    Sheila [name] 70 y.o. female transferred from day surgery after undergoing a right shoulder operation.  Following the procedure she had increased work of breathing and chest x-ray showed a right-sided pneumothorax with complete collapse.  She was transferred over to [facility] for assistance with placement of a chest tube.    Past Medical History:  Diagnosis Date  . DM (diabetes mellitus) (HCC)   . GERD (gastroesophageal reflux disease)   . Gout   . Hyperlipidemia   . Hypertension     History reviewed. No pertinent surgical history.  Family History  Problem Relation Age of Onset  . Heart disease Father      Social History   Tobacco Use  Smoking Status Former Smoker  . Packs/day: 1.00  . Years: 2.00  . Pack years: 2.00  . Quit date: 04/20/1975  . Years since quitting: 45.4  Smokeless Tobacco Never Used    Social History   Substance and Sexual Activity  Alcohol Use Never     Allergies  Allergen Reactions  . Lisinopril Diarrhea  . Other   . Metformin Diarrhea    Current Facility-Administered Medications  Medication Dose Route Frequency Provider Last Rate Last Admin  . acetaminophen (TYLENOL) tablet 650 mg  650 mg Oral Q6H PRN Clydie Braun, MD       Or  . acetaminophen (TYLENOL) suppository 650 mg  650 mg Rectal Q6H PRN Smith, Rondell A, MD      . albuterol (PROVENTIL) (2.5 MG/3ML) 0.083% nebulizer solution 2.5 mg  2.5 mg Nebulization Q6H PRN Smith, Rondell A, MD      . chlorhexidine (PERIDEX) 0.12 % solution 15 mL  15 mL Mouth Rinse BID Katrinka Blazing, Rondell A, MD      . Melene Muller ON 09/18/2020] enoxaparin (LOVENOX)  injection 30 mg  30 mg Subcutaneous Q24H Smith, Rondell A, MD      . insulin aspart (novoLOG) injection 0-15 Units  0-15 Units Subcutaneous TID WC Smith, Rondell A, MD      . MEDLINE mouth rinse  15 mL Mouth Rinse q12n4p Smith, Rondell A, MD      . ondansetron (ZOFRAN) tablet 4 mg  4 mg Oral Q6H PRN Madelyn Flavors A, MD       Or  . ondansetron (ZOFRAN) injection 4 mg  4 mg Intravenous Q6H PRN Smith, Rondell A, MD      . sodium chloride flush (NS) 0.9 % injection 3 mL  3 mL Intravenous Q12H Smith, Rondell A, MD   3 mL at 09/17/20 1425    Review of Systems  Respiratory: Positive for cough.   Cardiovascular: Positive for chest pain.  Musculoskeletal: Positive for joint pain.    PHYSICAL EXAMINATION: BP (!) 162/57 (BP Location: Left Arm)   Pulse 88   Temp (!) 97.5 F (36.4 C) (Oral)   Resp (!) 23   Ht 5' (1.524 m)   Wt 87.7 kg   SpO2 95%   BMI  37.76 kg/m   Physical Exam Constitutional:      General: She is in acute distress.     Appearance: She is obese.  HENT:     Head: Normocephalic.  Eyes:     Extraocular Movements: Extraocular movements intact.  Cardiovascular:     Rate and Rhythm: Normal rate.  Pulmonary:     Effort: Respiratory distress present.  Musculoskeletal:     Cervical back: Normal range of motion.  Skin:    General: Skin is warm.  Neurological:     General: No focal deficit present.     Mental Status: She is alert and oriented to person, place, and time.      Diagnostic Studies & Laboratory data:     Recent Radiology Findings:   DG Chest 1 View  Result Date: 09/17/2020 CLINICAL DATA:  Shortness of breath following shoulder surgery. Pneumothorax suspected. EXAM: CHEST  1 VIEW COMPARISON:  None. FINDINGS: Pneumothorax on the right with essentially complete collapse of the right lung but no evidence of tension. No midline shift or flattening of the right hemidiaphragm. Left chest remains clear. IMPRESSION: Pneumothorax on the right with complete collapse of  the right lung but no evidence of tension. Call report in progress. Electronically Signed   By: Paulina Fusi M.D.   On: 09/17/2020 10:55   CT SHOULDER RIGHT WO CONTRAST  Result Date: 08/24/2020 CLINICAL DATA:  Right shoulder pain, decreased range of motion EXAM: CT OF THE UPPER RIGHT EXTREMITY WITHOUT CONTRAST TECHNIQUE: Multidetector CT imaging of the upper right extremity was performed according to the standard protocol. COMPARISON:  None. FINDINGS: Bones/Joint/Cartilage No fracture or dislocation. Normal alignment. No joint effusion. Severe osteoarthritis of the glenohumeral joint with severe joint space narrowing, a bone-on-bone appearance, subchondral sclerosis, subchondral cystic changes and marginal osteophytosis. Mild arthropathy of the acromioclavicular joint.  Type I acromion. Ligaments Ligaments are suboptimally evaluated by CT. Muscles and Tendons Muscles are normal.  No muscle atrophy. Soft tissue No fluid collection or hematoma. No soft tissue mass. 3 mm right middle lobe pulmonary nodule. Second 2 mm right middle lobe pulmonary nodule. IMPRESSION: 1. Severe osteoarthritis of the glenohumeral joint. 2. 3 mm right middle lobe pulmonary nodule. Second 2 mm right middle lobe pulmonary nodule. No follow-up needed if patient is low-risk (and has no known or suspected primary neoplasm). Non-contrast chest CT can be considered in 12 months if patient is high-risk. This recommendation follows the consensus statement: Guidelines for Management of Incidental Pulmonary Nodules Detected on CT Images: From the Fleischner Society 2017; Radiology 2017; 284:228-243. Electronically Signed   By: Elige Ko   On: 08/24/2020 07:27   DG Shoulder Right Port  Result Date: 09/17/2020 CLINICAL DATA:  Status post total shoulder replacement EXAM: PORTABLE RIGHT SHOULDER: 1 V COMPARISON:  None. FINDINGS: Frontal view obtained. Patient is status post total shoulder replacement prosthetic components well-seated on frontal  view. No fracture or dislocation. Air within the right shoulder joint is an expected postoperative finding. There is an essentially complete pneumothorax on the right. IMPRESSION: Total shoulder replacement right with prosthetic components well-seated. No acute fracture or dislocation. Nearly complete pneumothorax on the right noted. Critical Value/emergent results were called by telephone at the time of interpretation on 09/17/2020 at 11:02 am to provider [facility] , who verbally acknowledged these results. Electronically Signed   By: Bretta Bang III M.D.   On: 09/17/2020 11:03       I have independently reviewed the above radiology studies  and  reviewed the findings with the patient.   Recent Lab Findings: Lab Results  Component Value Date   WBC 17.5 (H) 12/16/2015   HGB 12.0 12/16/2015   HCT 36.0 12/16/2015   PLT 404.0 (H) 12/16/2015   GLUCOSE 157 (H) 09/10/2020   NA 136 09/10/2020   K 4.0 09/10/2020   CL 100 09/10/2020   CREATININE 1.12 (H) 09/10/2020   BUN 27 (H) 09/10/2020   CO2 25 09/10/2020        Assessment / Plan:   70 year old female with a postprocedural right pneumothorax.  She currently is on a nonrebreather and showing signs of increased work of breathing.  We will place a pigtail catheter for evacuation of the pneumothorax.      Corliss Skains 09/17/2020 3:24 PM

## 2020-09-17 NOTE — Procedures (Signed)
Insertion of Chest Tube Procedure Note  Aunica Dauphinee  [id number]  1950/05/14  Date:09/17/20  Time:3:24 PM    Provider Performing: Corliss Skains   Procedure: Chest Tube Insertion (32551)  Indication(s) Pneumothorax  Consent Risks of the procedure as well as the alternatives and risks of each were explained to the patient and/or caregiver.  Consent for the procedure was obtained and is signed in the bedside chart  Anesthesia Topical only with 1% lidocaine    Time Out Verified patient identification, verified procedure, site/side was marked, verified correct patient position, special equipment/implants available, medications/allergies/relevant history reviewed, required imaging and test results available.   Sterile Technique Maximal sterile technique including full sterile barrier drape, hand hygiene, sterile gown, sterile gloves, mask, hair covering, sterile ultrasound probe cover (if used).   Procedure Description Ultrasound not used to identify appropriate pleural anatomy for placement and overlying skin marked. Area of placement cleaned and draped in sterile fashion.  A 14 French pigtail pleural catheter was placed into the right pleural space using Seldinger technique. Appropriate return of air was obtained.  The tube was connected to atrium and placed on -20 cm H2O wall suction.   Complications/Tolerance None; patient tolerated the procedure well. Chest X-ray is ordered to verify placement.   EBL Minimal  Specimen(s) none

## 2020-09-17 NOTE — Discharge Instructions (Signed)
Ramond Marrow MD, MPH Alfonse Alpers, PA-C [facility] [address] [phone] (tel)   [phone] (fax)   POST-OPERATIVE INSTRUCTIONS - TOTAL SHOULDER REPLACEMENT    WOUND CARE ? You may leave the operative dressing in place until your follow-up appointment. ? KEEP THE INCISIONS CLEAN AND DRY. ? There may be a small amount of fluid/bleeding leaking at the surgical site.  - This is normal after surgery.  ? If it fills with liquid or blood please call us immediately to change it for you. ? Use the provided ice machine or Ice packs as often as possible for the first 3-4 days, then as needed for pain relief.   - Keep a layer of cloth or a shirt between your skin and the cooling unit to prevent frost bite as it can get very cold.  SHOWERING: - You may shower on Post-Op Day #2.  - The dressing is water resistant but do not scrub it as it may start to peel up.   - You may remove the sling for showering, but keep a water resistant pillow under the arm to keep both the  elbow and shoulder away from the body (mimicking the abduction sling).  - Gently pat the area dry.  - Do not soak the shoulder in water.  - Do not go swimming in the pool or ocean until your incisions are completely healed (about 4-6 weeks after surgery) - KEEP THE INCISIONS CLEAN AND DRY.  EXERCISES ? Wear the sling at all times. ? You may remove the sling for showering, but keep the arm across the chest or in a secondary sling.    ? Accidental/Purposeful External Rotation and shoulder flexion (reaching behind you) is to be avoided at all costs for the first month. ? It is ok to come out of your sling if your are sitting and have assistance for eating.   ? Do not lift anything heavier than 1 pound until we discuss it further in clinic.  REGIONAL ANESTHESIA (NERVE BLOCKS) . The anesthesia team may have performed a nerve block for you if safe in the setting of your care.  This is a great  tool used to minimize pain.  Typically the block may start wearing off overnight but the long acting medicine may last for 3-4 days.  The nerve block wearing off can be a challenging period but please utilize your as needed pain medications to try and manage this period.    POST-OP MEDICATIONS- Multimodal approach to pain control . In general your pain will be controlled with a combination of substances.  Prescriptions unless otherwise discussed are electronically sent to your pharmacy.  This is a carefully made plan we use to minimize narcotic use.     ? Celebrex - Anti-inflammatory medication taken on a scheduled basis ? Take 1 tablet twice a day ? Acetaminophen - Non-narcotic pain medicine taken on a scheduled basis  ? Take two 500 mg tablets (1,000 mg total) every 8 hours ? Oxycodone - This is a strong narcotic, to be used only on an "as needed" basis for severe pain. ? Aspirin 81mg  - This medicine is used to minimize the risk of blood clots after surgery. ? Take 1 tablet twice a day for 6 weeks ? Omeprazole - daily medicine to protect your stomach while taking anti-inflammatories.  ? Zofran -  take as needed for nausea  FOLLOW-UP ? If you develop a Fever (>101.5), Redness or Drainage from the  surgical incision site, please call our office to arrange for an evaluation. ? Please call the office to schedule a follow-up appointment for a wound check, 7-10 days post-operatively.  IF YOU HAVE ANY QUESTIONS, PLEASE FEEL FREE TO CALL OUR OFFICE.  HELPFUL INFORMATION  . If you had a block, it will wear off between 8-24 hrs postop typically.  This is period when your pain may go from nearly zero to the pain you would have had post-op without the block.  This is an abrupt transition but nothing dangerous is happening.  You may take an extra dose of narcotic when this happens.  ? Your arm will be in a sling following surgery. You will be in this sling for the next 4 weeks.  I will let you know the  exact duration at your follow-up visit.  ? You may be more comfortable sleeping in a semi-seated position the first few nights following surgery.  Keep a pillow propped under the elbow and forearm for comfort.  If you have a recliner type of chair it might be beneficial.  If not that is fine too, but it would be helpful to sleep propped up with pillows behind your operated shoulder as well under your elbow and forearm.  This will reduce pulling on the suture lines.  ? When dressing, put your operative arm in the sleeve first.  When getting undressed, take your operative arm out last.  Loose fitting, button-down shirts are recommended.  ? In most states it is against the law to drive while your arm is in a sling. And certainly against the law to drive while taking narcotics.  ? You may return to work/school in the next couple of days when you feel up to it. Desk work and typing in the sling is fine.  ? We suggest you use the pain medication the first night prior to going to bed, in order to ease any pain when the anesthesia wears off. You should avoid taking pain medications on an empty stomach as it will make you nauseous.  ? Do not drink alcoholic beverages or take illicit drugs when taking pain medications.  ? Pain medication may make you constipated.  Below are a few solutions to try in this order: - Decrease the amount of pain medication if you aren't having pain. - Drink lots of decaffeinated fluids. - Drink prune juice and/or each dried prunes  o If the first 3 don't work start with additional solutions - Take Colace - an over-the-counter stool softener - Take Senokot - an over-the-counter laxative - Take Miralax - a stronger over-the-counter laxative   Dental Antibiotics:  In most cases prophylactic antibiotics for Dental procdeures after total joint surgery are not necessary.  Exceptions are as follows:  1. History of prior total joint infection  2. Severely immunocompromised  (Organ Transplant, cancer chemotherapy, Rheumatoid biologic meds such as Humera)  3. Poorly controlled diabetes (A1C &gt; 8.0, blood glucose over 200)  If you have one of these conditions, contact your surgeon for an antibiotic prescription, prior to your dental procedure.   For more information including helpful videos and documents visit our website:   [email or web address]

## 2020-09-17 NOTE — H&P (Signed)
History and Physical    Sheila [name] IWL:[id number] DOB: [birth year] DOA: 09/17/2020  Referring MD/NP/PA: Maurie Boettcher, MD PCP: Ignatius Specking, MD  Patient coming from: Surgical center transfer  Chief Complaint: Shortness of breath  I have personally briefly reviewed patient's old medical records in [facility]   HPI: Sheila [name] is a 70 y.o. 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 Chest x-ray showed pneumothorax of the right with complete collapse of the right lung.  They reached out to Dr. Cliffton Asters of cardiothoracic surgery after x-ray imaging for placement of a chest tube.  TRH was called to admit for the pneumothorax.  Patient was accepted to a progressive bed.  ED Course: As seen above.  Review of Systems  Unable to perform ROS: Severe respiratory distress  Respiratory: Positive for shortness of breath.   Psychiatric/Behavioral: The patient is nervous/anxious.   All other systems reviewed and are negative.   Past Medical History:  Diagnosis Date  . DM (diabetes mellitus) (HCC)   . GERD (gastroesophageal reflux disease)   . Gout   . Hyperlipidemia   . Hypertension     History reviewed. No pertinent surgical history.   reports that she quit smoking about 45 years ago. She has a 2.00 pack-year smoking history. She has never used smokeless tobacco. She reports that she does not drink alcohol and does not use drugs.  Allergies  Allergen Reactions  . Lisinopril Diarrhea  . Other   . Metformin Diarrhea    Family History  Problem Relation Age of Onset  . Heart disease Father     Prior to Admission medications   Medication Sig Start Date End Date Taking? Authorizing Provider  acetaminophen  (TYLENOL) 500 MG tablet Take 2 tablets (1,000 mg total) by mouth every 8 (eight) hours for 14 days. 09/17/20 10/01/20 Yes McBane, Jerald Kief, PA-C  aspirin (ASPIRIN CHILDRENS) 81 MG chewable tablet Chew 1 tablet (81 mg total) by mouth 2 (two) times daily. For DVT prophylaxis after surgery 09/17/20 10/29/20 Yes McBane, Jerald Kief, PA-C  celecoxib (CELEBREX) 100 MG capsule Take 1 capsule (100 mg total) by mouth 2 (two) times daily. For pain / inflammation 09/17/20 10/17/20 Yes McBane, Jerald Kief, PA-C  colchicine-probenecid 0.5-500 MG tablet Take 1 tablet by mouth daily. 04/12/19  Yes [provider]  empagliflozin (JARDIANCE) 25 MG TABS tablet Take 12.5 mg by mouth daily.   Yes [provider]  furosemide (LASIX) 40 MG tablet TAKE 1 TABLET BY MOUTH DAILY (STOP BUMEX) 04/26/18  Yes [provider]  NOVOLOG FLEXPEN 100 UNIT/ML FlexPen inject 18 UNITS INTO THE SKIN TWICE DAILY AND PER sliding scale (max DOSE 50 UNITS PER DAY) 03/13/18  Yes [provider]  omeprazole (PRILOSEC) 20 MG capsule Take by mouth.   Yes [provider]  omeprazole (PRILOSEC) 20 MG capsule Take 1 capsule (20 mg total) by mouth daily. To gastric protection while taking NSAIDs 09/17/20 10/17/20 Yes McBane, Jerald Kief, PA-C  ondansetron (ZOFRAN) 4 MG tablet Take 1 tablet (4 mg total) by mouth every 8 (eight) hours as needed for up to 7 days for nausea or vomiting. 09/17/20 09/24/20 Yes McBane, Jerald Kief, PA-C  oxyCODONE (OXY IR/ROXICODONE) 5 MG immediate release tablet Take 1-2 pills every 6 hrs as needed for  severe pain, no more than 6 per day 09/17/20 09/22/20 Yes McBane, Jerald Kiefaroline N, PA-C  traMADol (ULTRAM) 50 MG tablet Take 50 mg by mouth 3 (three) times daily as needed. 07/23/19  Yes [provider]  TRESIBA FLEXTOUCH 200 UNIT/ML SOPN inject 35 units AT BEDTIME (max DOSE 60 units PER DAY) 03/13/18  Yes [provider]  triamcinolone cream (KENALOG) 0.1 % Apply topically 3 (three) times daily.  06/06/20  Yes [provider]  atorvastatin (LIPITOR) 10 MG tablet Take 10 mg by mouth at bedtime. 03/13/18   [provider]  atorvastatin (LIPITOR) 20 MG tablet Take 20 mg by mouth at bedtime. 05/05/19   [provider]  celecoxib (CELEBREX) 100 MG capsule Take 1 capsule (100 mg total) by mouth 2 (two) times daily. 09/06/19   Felecia ShellingEvans, Brent M, DPM  Cholecalciferol (VITAMIN D3) 1.25 MG (50000 UT) CAPS Take 1 capsule by mouth once a week. 03/27/19   [provider]  CONTOUR NEXT TEST test strip 4 (four) times daily. for testing 03/13/18   [provider]  GLOBAL EASE INJECT PEN NEEDLES 31G X 5 MM MISC 4 (four) times daily. 04/17/18   [provider]  hydrOXYzine (ATARAX/VISTARIL) 10 MG tablet Take by mouth. 10/09/18   [provider]  hydrOXYzine (ATARAX/VISTARIL) 25 MG tablet Take 25 mg by mouth at bedtime. 04/12/19   [provider]  meloxicam (MOBIC) 15 MG tablet Take 15 mg by mouth daily. 05/02/19   [provider]  traZODone (DESYREL) 50 MG tablet Take 50 mg by mouth at bedtime. 01/09/20   [provider]  UNABLE TO FIND Pen Needle 31 gauge x 3/16"  USE FOUR TIMES DAILY    [provider]  UNABLE TO FIND true metrix strips (diabetic club)  DIABETIC CLUB    [provider]  UNABLE TO FIND true metrixstrips(diabeticclub50)  DIABETIC CLUB    [provider]  UNABLE TO FIND true metrixstrips(diabeticclub)  DIABETIC CLUB    [provider]  Insulin Detemir (LEVEMIR FLEXTOUCH) 100 UNIT/ML Pen Levemir FlexTouch U-100 Insulin 100 unit/mL (3 mL) subcutaneous pen  05/03/18  [provider]    Physical Exam:  Constitutional: Alert female who appears to be in discomfort Vitals:   09/17/20 1220 09/17/20 1230 09/17/20 1240 09/17/20 1245  BP: (!) 174/69 (!) 164/81 (!) 165/79 (!) 157/80  Pulse: 69 71 76 72  Resp: 18 16 (!) 25 16  Temp:      TempSrc:      SpO2: 99% 99% 99%  99%  Weight:      Height:       Eyes: PERRL, lids and conjunctivae normal ENMT: Mucous membranes are moist. Posterior pharynx clear of any exudate or lesions.  Neck: normal, supple, no masses, no thyromegaly Respiratory: Patient was tachypneic with absent breath sounds on the right lung field.  Currently on a nonrebreather at 15 L with O2 saturations 99-100%able to talk in shortened sentences. Cardiovascular: Regular rate and rhythm, no murmurs / rubs / gallops. No extremity edema. 2+ pedal pulses. No carotid bruits.  Abdomen: no tenderness, no masses palpated. No hepatosplenomegaly. Bowel sounds positive.  Musculoskeletal: no clubbing / cyanosis. No joint deformity upper and lower extremities. Good ROM, no contractures. Normal muscle tone.  Skin: no rashes, lesions, ulcers. No induration Neurologic: CN 2-12 grossly intact. Sensation intact, DTR normal. Strength 5/5 in all 4.  Psychiatric: Normal judgment and insight. Alert and oriented x 3.  Anxious mood.  Labs on Admission: I have personally reviewed following labs and imaging studies  CBC: No results for input(s): WBC, NEUTROABS, HGB, HCT, MCV, PLT in the last 168 hours. Basic Metabolic Panel: Recent Labs  Lab 09/10/20 1400  NA 136  K 4.0  CL 100  CO2 25  GLUCOSE 157*  BUN 27*  CREATININE 1.12*  CALCIUM 9.2   GFR: Estimated Creatinine Clearance: 48 mL/min (A) (by C-G formula based on SCr of 1.12 mg/dL (H)). Liver Function Tests: No results for input(s): AST, ALT, ALKPHOS, BILITOT, PROT, ALBUMIN in the last 168 hours. No results for input(s): LIPASE, AMYLASE in the last 168 hours. No results for input(s): AMMONIA in the last 168 hours. Coagulation Profile: No results for input(s): INR, PROTIME in the last 168 hours. Cardiac Enzymes: No results for input(s): CKTOTAL, CKMB, CKMBINDEX, TROPONINI in the last 168 hours. BNP (last 3 results) No results for input(s): PROBNP in the last 8760 hours. HbA1C: No results for  input(s): HGBA1C in the last 72 hours. CBG: Recent Labs  Lab 09/17/20 0651 09/17/20 1036 09/17/20 1251  GLUCAP 161* 208* 202*   Lipid Profile: No results for input(s): CHOL, HDL, LDLCALC, TRIG, CHOLHDL, LDLDIRECT in the last 72 hours. Thyroid Function Tests: No results for input(s): TSH, T4TOTAL, FREET4, T3FREE, THYROIDAB in the last 72 hours. Anemia Panel: No results for input(s): VITAMINB12, FOLATE, FERRITIN, TIBC, IRON, RETICCTPCT in the last 72 hours. Urine analysis: No results found for: COLORURINE, APPEARANCEUR, LABSPEC, PHURINE, GLUCOSEU, HGBUR, BILIRUBINUR, KETONESUR, PROTEINUR, UROBILINOGEN, NITRITE, LEUKOCYTESUR Sepsis Labs: Recent Results (from the past 240 hour(s))  Surgical pcr screen     Status: None   Collection Time: 09/10/20  1:22 PM   Specimen: Nasal Mucosa; Nasal Swab  Result Value Ref Range Status   MRSA, PCR NEGATIVE NEGATIVE Final   Staphylococcus aureus NEGATIVE NEGATIVE Final    Comment: (NOTE) The Xpert SA Assay (FDA approved for NASAL specimens in patients 50 years of age and older), is one component of a comprehensive surveillance program. It is not intended to diagnose infection nor to guide or monitor treatment. Performed at [facility], [address], [place], [place] [address]      Radiological Exams on Admission: DG Chest 1 View  Result Date: 09/17/2020 CLINICAL DATA:  Shortness of breath following shoulder surgery. Pneumothorax suspected. EXAM: CHEST  1 VIEW COMPARISON:  None. FINDINGS: Pneumothorax on the right with essentially complete collapse of the right lung but no evidence of tension. No midline shift or flattening of the right hemidiaphragm. Left chest remains clear. IMPRESSION: Pneumothorax on the right with complete collapse of the right lung but no evidence of tension. Call report in progress. Electronically Signed   By: Paulina Fusi M.D.   On: 09/17/2020 10:55   DG Shoulder Right Port  Result Date: 09/17/2020 CLINICAL DATA:   Status post total shoulder replacement EXAM: PORTABLE RIGHT SHOULDER: 1 V COMPARISON:  None. FINDINGS: Frontal view obtained. Patient is status post total shoulder replacement prosthetic components well-seated on frontal view. No fracture or dislocation. Air within the right shoulder joint is an expected postoperative finding. There is an essentially complete pneumothorax on the right. IMPRESSION: Total shoulder replacement right with prosthetic components well-seated. No acute fracture or dislocation. Nearly complete pneumothorax on the right noted. Critical Value/emergent results were called by telephone at the time of interpretation on 09/17/2020 at 11:02 am to provider [facility] , who verbally acknowledged these results. Electronically Signed   By: Bretta Bang III M.D.  On: 09/17/2020 11:03    EKG: Independently reviewed from 09/10/2020.  Normal sinus rhythm at 68 bpm  Assessment/Plan Acute respiratory failure with hypoxia secondary to postprocedural pneumothorax: Following extubation patient was found to have nearly complete pneumothorax on the right by chest x-ray.  O2 saturations noted to be as low as 87% for which patient was placed on nonrebreather mask at 15 L with improvement in O2 saturations.  Patient otherwise hemodynamically stable.  Initially cardiothoracic surgery have been consulted for chest tube placement.   -Admit to a progressive bed -Continuous pulse oximetry with oxygen to maintain O2 saturation greater than 92% -Appreciate cardiothoracic surgery for chest tube placement and management  Leukocytosis: WBC elevated at 16.2.  Suspect that this is reactive in nature secondary to above. -Recheck CBC in a.m.  Status post right reverse total shoulder arthroplasty: Patient is status post right reverse total shoulder arthroplasty by Dr. Everardo Pacific who recommended NWB in sling.  Recommended DVT prophylaxis Aspirin 81 mg twice daily for 6 weeks at discharge.  Pain control with PRN  pain medication preferring oral medicines.  Follow up plan will be scheduled in approximately 7 days for incision check and XR.  Physical therapy to start after first visit. -Hydrocodone as needed for pain -Continue outpatient follow-up with orthopedic  Diabetes mellitus type 2: On admission patient's initial glucose was 224.  Home medication regimen includes Jardiance 12.5 mg daily, NovoLog 16 units twice daily with meals, and Tresiba 52 units nightly. -Hypoglycemic protocols -Check hemoglobin A1c -Held Jardiance -CBGs before every meal with moderate SSI -Reduce Tresiba  to 42 units nightly   History of gout -Continue colchicine-probenecid  GERD -Continue pharmacy substitution for omeprazole\  Obesity: BMI 37.76 kg/m  DVT prophylaxis: Lovenox Code Status: Full Family Communication: none Disposition Plan: Home Consults called: Cardiothoracic surgery Admission status: Observation, requiring possibly less than 24-hour hospital stay  Clydie Braun MD [facility]   If 7PM-7AM, please contact night-coverage   09/17/2020, 1:07 PM

## 2020-09-17 NOTE — Progress Notes (Signed)
Pt transferred via carelink to room 4E15, tele initiated, CHG completed.  Dr.Smith and rapid response RN notified of her arrival.  Awaiting orders and possible CT insertion.  Pt complains of SOB but no pain at this time.  R arm in sling from surgery.

## 2020-09-17 NOTE — Op Note (Addendum)
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  Preoperative blueprint templating demonstrated the patient had 30 degrees of retroversion and significantly small anatomy.  We did not feel that based on her preoperative motion about 30 degrees that her forward flexion would have allowed for success with a anatomic total shoulder.  Additionally because of her medialization we did not feel that the rotator cuff would be able to adequately be repaired.  We felt that a reverse social arthroplasty was best for her.  Her reduction was relatively tight but there is no impingement of bone.  Tug test was normal at the end of the case.  Her anatomy was such that just under 5 feet tall that we did not feel that downsizing our stem in taking more bone would be appropriate.  We felt that with her truncal obesity she was at high risk for dislocation and felt that the additional tension would help avoid this.  The case was relatively routine considering the half wedge augment.  We had good fixation and good fit.  Unfortunately in the postoperative area it was noted that she had a pneumothorax.  The anesthesia team felt that this could be related to a traumatic intubation.  Her shoulder appeared normal on her postoperative x-rays.  It was advised that she should be admitted to the main hospital for cardiothoracic consultation with medicine managing her comorbidities.  Orthopedics will continue to follow.  Post-operative plan: The patient will be NWB in sling.  The patient will be Will be transferred to [facility] due to pneumothorax for cardiothoracic and medical management.  DVT prophylaxis Aspirin 81 mg twice daily for 6 weeks.  Pain control with PRN  pain medication preferring oral medicines.  Follow up plan will be scheduled in approximately 7 days for incision check and XR.  Physical therapy to start after first visit.  Implants: Tornier size 2B short stem, 0 high offset tray set to 4:00, 36+6 polyethylene, 36 standard glenosphere with a half wedge 25 mm baseplate and 30 center screw.  Post-Op Diagnosis: Same Surgeons:Primary: Hiram Gash, MD Assistants:Caroline McBane PA-C Location: [facility] OR ROOM 6 Anesthesia: General with Exparel Interscalene Antibiotics: Ancef 2g preop, Vancomycin 1028m locally Tourniquet time: None Estimated Blood Loss: 1998Complications: None from the orthopedic standpoint however the patient was noted to have a pneumothorax on her postoperative chest x-ray. Specimens: None Implants: Implant Name Type Inv. Item Serial No. Manufacturer Lot No. LRB No. Used Action  BASEPLATE AUGMNT [id number] - [id number] BASEPLATE AUGMNT [id number] [id number]  Right 1 Implanted  GLENOSPHERE REV SHOULDER 36 - [id number] GLENOSPHERE REV SHOULDER 36 [id number]  Right 1 Implanted  SCREW PERIPHERAL 5.0X34 - [id number] SCREW PERIPHERAL 5.0X34  [employer] ON SET Right 1 Implanted  SCREW PERIPHERAL 30 - [id number] SCREW PERIPHERAL 30  [employer] ON SET Right 1 Implanted  SCREW 5.0X18 - [id number] SCREW 5.0X18  [employer] ON SET Right 2 Implanted  6.5 x 30 screw (central)    [employer] ON SET Right 1 Implanted  IMPLANT REVERSE SHOULDER 0X3.5 - [id number] IMPLANT REVERSE SHOULDER 0X3.5  [employer] [id number] 1 Implanted  INSERT HUMERAL 36X6MM 12.5DEG - [id number] INSERT HUMERAL 36X6MM 12.5DEG  [employer]  INC [id number] Right 1 Implanted  STEM HUMERAL SZ2B STND 70 PTC - [id number] Stem STEM HUMERAL SZ2B STND 70 PTC  [employer] [id number] Right 1 Implanted    Indications for Surgery:   Eriyah Fernando is a 70 y.o. female with end-stage glenohumeral arthritis  with a B3 glenoid significant limited in motion and activities of daily living.  Benefits and risks of operative and nonoperative management were discussed prior to surgery with patient/guardian(s) and informed consent form was completed.  Infection and need for further surgery were discussed as was prosthetic stability and cuff issues.  We additionally specifically discussed risks of axillary nerve injury, infection, periprosthetic fracture, continued pain and longevity of implants prior to beginning procedure.      Procedure:   The patient was identified in the preoperative holding area where the surgical site was marked. Block placed by anesthesia with exparel.  The patient was taken to the OR where a procedural timeout was called and the above noted anesthesia was induced.  The patient was positioned beachchair on allen table with spider arm positioner.  Preoperative antibiotics were dosed.  The patient's right shoulder was prepped and draped in the usual sterile fashion.  A second preoperative timeout was called.       Standard deltopectoral approach was performed with a #10 blade. We dissected down to the subcutaneous tissues and the cephalic vein was taken laterally with the deltoid. Clavipectoral fascia was incised in line with the incision. Deep retractors were placed. The long of the biceps tendon was identified and there was significant tenosynovitis present.  Tenodesis was performed to the pectoralis tendon with #2 Ethibond. The remaining biceps was followed up into the rotator interval where it was released.   The subscapularis was taken down in a full thickness layer with capsule along the humeral neck extending inferiorly around the humeral head. We continued releasing the capsule directly off of the osteophytes inferiorly all the way around the corner. This allowed [place] to dislocate the humeral head.   The humeral head had evidence of severe osteoarthritic wear with full-thickness cartilage  loss and exposed subchondral bone. There was significant flattening of the humeral head.   There is clearly very little excursion to the subscapularis and it was not amenable to a total shoulder arthroplasty.  Reverse was decided on as the appropriate option.  There were osteophytes along the inferior humeral neck. The osteophytes were removed with an osteotome and a rongeur.  Osteophytes were removed with a rongeur and an osteotome and the anatomic neck was well visualized.     A humeral cutting guide was inserted down the intramedullary canal. The version was set at 20 of retroversion. Humeral osteotomy was performed with an oscillating saw. The head fragment was passed off the back table. A starter awl was used to open the humeral canal. We next used T-handle straight sound reamers to ream up to an appropriate fit. A chisel was used to remove proximal humeral bone. We then broached starting with a size one broach and broaching up to 2 which obtained an appropriate fit. The broach handle was removed. A cut protector was placed. The broach handle was removed and a cut protector was placed. The humerus was retracted posteriorly and we turned our attention to glenoid exposure.  The subscapularis was again identified and immediately we took care to palpate the axillary nerve anteriorly and verify its position with gentle palpation as well as the tug test.  We then released the SGHL with  bovie cautery prior to placing a curved mayo at the junction of the anterior glenoid well above the axillary nerve and bluntly dissecting the subscapularis from the capsule.  We then carefully protected the axillary nerve as we gently released the inferior capsule to fully mobilize the subscapularis.  An anterior deltoid retractor was then placed as well as a small Hohmann retractor superiorly.   The glenoid was inspected and had evidence of severe osteoarthritic wear with full-thickness cartilage loss and exposed  subchondral bone.  There was significant posterior erosion and a B3 glenoid was noted.   The remaining labrum was removed circumferentially taking great care not to disrupt the posterior capsule.   Due to the patient significant deformity we used a half wedge augment as was preoperatively templated using blueprint software to recreate her anatomy.  We used a pin guide to generally identify the starting point which was confirmed on our preoperative plan.  We inserted our pin in the appropriate position in the center center type location.  That point we were able to ream the anterior portion of our baseplate's seating position with a flat reamer.  We then used the offset reamer to ream the posterior aspect of 35 degrees as is typical.  We checked our position and using a trial.  Once he had appropriate fit we then drilled our center hole after removing our wire and measured for 30 x 6.5 screw.  It was tapped and were able to pace our baseplate noting good fit and coverage posteriorly and anteriorly.  Once was fully seated we placed 4 peripheral screws including 3 locking screws.  We cleared peripheral soft tissue and bone and then placed a 36 glenosphere without issue.  We turned attention back to the humeral side. The cut protector was removed. We trialed with multiple size tray and polyethylene options and selected a 6 which provided good stability and range of motion without excess soft tissue tension. The offset was dialed in to match the normal anatomy. The shoulder was trialed.  There was good ROM in all planes and the shoulder was stable with no inferior translation.  The real humeral implants were opened after again confirming sizes.  The trial was removed. #5 Fiberwire x4 sutures passed through the humeral neck for subscap repair. The humeral component was press-fit obtaining a secure fit. A +0 high offset tray was selected and impacted onto the stem.  A 36+6 polyethylene liner was impacted onto the  stem.  The joint was reduced and thoroughly irrigated with pulsatile lavage. Subscap was repaired back with #5 Fiberwire sutures through bone tunnels. Hemostasis was obtained. The deltopectoral interval was reapproximated with #1 Ethibond. The subcutaneous tissues were closed with 2-0 Vicryl and the skin was closed with running monocryl.    The wounds were cleaned and dried and an Aquacel dressing was placed. The drapes taken down. The arm was placed into sling with abduction pillow. Patient was awakened, extubated, and transferred to the recovery room in stable condition. There were no intraoperative complications. The sponge, needle, and attention counts were  correct at the end of the case.        Noemi Chapel, PA-C, present and scrubbed throughout the case, critical for completion in a timely fashion, and for retraction, instrumentation, closure.

## 2020-09-17 NOTE — Progress Notes (Addendum)
Assisted Dr. Hart [place] with right, ultrasound guided, Interscalene block. Side rails up, monitors on throughout procedure. See vital signs in flow sheet. Tolerated Procedure well.

## 2020-09-17 NOTE — Significant Event (Signed)
Rapid Response Event Note   Reason for Call :  Pt direct admit from orthopedic surgery center s/p right total shoulder arthroplasty. Post-op she developed a pneumothorax. She was transferred to [facility] for chest tube placement.   Initial Focused Assessment:  Pt lying in bed, AO. Endorses difficulty breathing. Tachypneic. No breaths sounds heard to right upper or lower lobe. Lungs are clear, diminished on the left. Skin is warm, dry.   VS: T 97.24F, BP 162/57, HR 88, RR 23, SpO2 95% on 15L NRB  Interventions:  -Dr. Arlyss Queen notified of pt arrival -CXR completed at surgery center  Plan of Care:  -Chest tube insertion by Dr. Cliffton Asters -Wean oxygen as pt tolerates  Call rapid response for additional needs  Event Summary:  MD Notified: Dr. Arlyss Queen Call Time: 1331 Arrival Time: 1335 End Time: 1500  Jennye Moccasin, RN

## 2020-09-17 NOTE — Transfer of Care (Signed)
Immediate Anesthesia Transfer of Care Note  Patient: Sheila [name]  Procedure(s) Performed: REVERSE SHOULDER ARTHROPLASTY (Right Shoulder)  Patient Location: PACU  Anesthesia Type:GA combined with regional for post-op pain  Level of Consciousness: awake  Airway & Oxygen Therapy: Patient Spontanous Breathing and Patient connected to face mask oxygen  Post-op Assessment: Report given to RN and Post -op Vital signs reviewed and stable  Post vital signs: Reviewed and stable  Last Vitals:  Vitals Value Taken Time  BP 185/79 09/17/20 1030  Temp    Pulse 86 09/17/20 1032  Resp 19 09/17/20 1032  SpO2 87 % 09/17/20 1032  Vitals shown include unvalidated device data.  Last Pain:  Vitals:   09/17/20 0655  TempSrc: Oral  PainSc: 8          Complications: No complications documented.

## 2020-09-17 NOTE — Anesthesia Procedure Notes (Addendum)
Procedure Name: Intubation Date/Time: 09/17/2020 8:40 AM Performed by: Tawni Millers, CRNA Pre-anesthesia Checklist: Patient identified, Emergency Drugs available, Suction available and Patient being monitored Patient Re-evaluated:Patient Re-evaluated prior to induction Oxygen Delivery Method: Circle system utilized Preoxygenation: Pre-oxygenation with 100% oxygen Induction Type: IV induction Ventilation: Mask ventilation without difficulty Laryngoscope Size: Mac and 4 Grade View: Grade III Tube type: Oral Tube size: 7.0 mm Number of attempts: 2 Airway Equipment and Method: Stylet and Oral airway Placement Confirmation: ETT inserted through vocal cords under direct vision,  positive ETCO2 and breath sounds checked- equal and bilateral Tube secured with: Tape Dental Injury: Teeth and Oropharynx as per pre-operative assessment  Difficulty Due To: Difficult Airway- due to anterior larynx, Difficulty was anticipated and Difficult Airway- due to reduced neck mobility Comments: Bougie utilized by MDA. No changes to dentition.

## 2020-09-17 NOTE — Anesthesia Preprocedure Evaluation (Addendum)
Anesthesia Evaluation  Patient identified by MRN, date of birth, ID band Patient awake    Reviewed: Allergy & Precautions, NPO status , Patient's Chart, lab work & pertinent test results  Airway Mallampati: III  TM Distance: <3 FB Neck ROM: Limited    Dental  (+) Teeth Intact, Dental Advisory Given   Pulmonary asthma , former smoker,    Pulmonary exam normal        Cardiovascular hypertension,  Rhythm:Regular Rate:Normal     Neuro/Psych negative neurological ROS  negative psych ROS   GI/Hepatic GERD  Medicated,  Endo/Other  diabetes, Type 2, Insulin Dependent  Renal/GU      Musculoskeletal   Abdominal (+) + obese,   Peds  Hematology   Anesthesia Other Findings   Reproductive/Obstetrics                            Anesthesia Physical Anesthesia Plan  ASA: II  Anesthesia Plan: General   Post-op Pain Management: GA combined w/ Regional for post-op pain   Induction: Intravenous  PONV Risk Score and Plan: 4 or greater and Ondansetron, Dexamethasone, Midazolam and Treatment may vary due to age or medical condition  Airway Management Planned: Oral ETT  Additional Equipment: None  Intra-op Plan:   Post-operative Plan: Extubation in OR  Informed Consent: I have reviewed the patients History and Physical, chart, labs and discussed the procedure including the risks, benefits and alternatives for the proposed anesthesia with the patient or authorized representative who has indicated his/her understanding and acceptance.     Dental advisory given  Plan Discussed with: CRNA  Anesthesia Plan Comments:        Anesthesia Quick Evaluation

## 2020-09-17 NOTE — Anesthesia Procedure Notes (Addendum)
Anesthesia Regional Block: Interscalene brachial plexus block   Pre-Anesthetic Checklist: ,, timeout performed, Correct Patient, Correct Site, Correct Laterality, Correct Procedure, Correct Position, site marked, Risks and benefits discussed,  Surgical consent,  Pre-op evaluation,  At surgeon's request and post-op pain management  Laterality: Right  Prep: chloraprep       Needles:  Injection technique: Single-shot  Needle Type: Echogenic Stimulator Needle     Needle Length: 5cm  Needle Gauge: 21     Additional Needles:   Procedures:,,,, ultrasound used (permanent image in chart),,,,  Narrative:  Start time: 09/17/2020 8:10 AM End time: 09/17/2020 8:15 AM Injection made incrementally with aspirations every 5 mL.  Performed by: Personally  Anesthesiologist: [name], MD  Additional Notes: Patient tolerated the procedure well. Local anesthetic introduced in an incremental fashion under minimal resistance after negative aspirations. No paresthesias were elicited. After completion of the procedure, no acute issues were identified and patient continued to be monitored by RN.

## 2020-09-17 NOTE — Interval H&P Note (Signed)
History and Physical Interval Note:  09/17/2020 8:00 AM  Sheila [name]  has presented today for surgery, with the diagnosis of DJD RIGHT SHOULDER.  The various methods of treatment have been discussed with the patient and family. After consideration of risks, benefits and other options for treatment, the patient has consented to  Procedure(s): TOTAL SHOULDER ARTHROPLASTY (Right) as a surgical intervention.  The patient's history has been reviewed, patient examined, no change in status, stable for surgery.  I have reviewed the patient's chart and labs.  Questions were answered to the patient's satisfaction.    Due to primary glenohumeral arthritis of the left shoulder patient desired injection while asleep.  We felt this was appropriate to offer.     Bjorn Pippin

## 2020-09-18 ENCOUNTER — Encounter (HOSPITAL_BASED_OUTPATIENT_CLINIC_OR_DEPARTMENT_OTHER): Payer: Self-pay | Admitting: Orthopaedic Surgery

## 2020-09-18 ENCOUNTER — Observation Stay (HOSPITAL_COMMUNITY): Payer: Medicare Other

## 2020-09-18 DIAGNOSIS — Z79899 Other long term (current) drug therapy: Secondary | ICD-10-CM | POA: Diagnosis not present

## 2020-09-18 DIAGNOSIS — E1165 Type 2 diabetes mellitus with hyperglycemia: Secondary | ICD-10-CM | POA: Diagnosis present

## 2020-09-18 DIAGNOSIS — Z20822 Contact with and (suspected) exposure to covid-19: Secondary | ICD-10-CM | POA: Diagnosis present

## 2020-09-18 DIAGNOSIS — J9601 Acute respiratory failure with hypoxia: Secondary | ICD-10-CM

## 2020-09-18 DIAGNOSIS — J95811 Postprocedural pneumothorax: Secondary | ICD-10-CM | POA: Diagnosis not present

## 2020-09-18 DIAGNOSIS — K219 Gastro-esophageal reflux disease without esophagitis: Secondary | ICD-10-CM

## 2020-09-18 DIAGNOSIS — D72828 Other elevated white blood cell count: Secondary | ICD-10-CM | POA: Diagnosis present

## 2020-09-18 DIAGNOSIS — E1169 Type 2 diabetes mellitus with other specified complication: Secondary | ICD-10-CM | POA: Diagnosis not present

## 2020-09-18 DIAGNOSIS — E785 Hyperlipidemia, unspecified: Secondary | ICD-10-CM | POA: Diagnosis present

## 2020-09-18 DIAGNOSIS — E669 Obesity, unspecified: Secondary | ICD-10-CM | POA: Diagnosis not present

## 2020-09-18 DIAGNOSIS — M19011 Primary osteoarthritis, right shoulder: Secondary | ICD-10-CM | POA: Diagnosis present

## 2020-09-18 DIAGNOSIS — Z87891 Personal history of nicotine dependence: Secondary | ICD-10-CM | POA: Diagnosis not present

## 2020-09-18 DIAGNOSIS — Z6837 Body mass index (BMI) 37.0-37.9, adult: Secondary | ICD-10-CM | POA: Diagnosis not present

## 2020-09-18 DIAGNOSIS — Z7982 Long term (current) use of aspirin: Secondary | ICD-10-CM | POA: Diagnosis not present

## 2020-09-18 DIAGNOSIS — I10 Essential (primary) hypertension: Secondary | ICD-10-CM | POA: Diagnosis not present

## 2020-09-18 DIAGNOSIS — Z794 Long term (current) use of insulin: Secondary | ICD-10-CM | POA: Diagnosis not present

## 2020-09-18 DIAGNOSIS — J9811 Atelectasis: Secondary | ICD-10-CM | POA: Diagnosis not present

## 2020-09-18 DIAGNOSIS — M109 Gout, unspecified: Secondary | ICD-10-CM | POA: Diagnosis present

## 2020-09-18 LAB — BASIC METABOLIC PANEL
Anion gap: 12 (ref 5–15)
BUN: 22 mg/dL (ref 8–23)
CO2: 24 mmol/L (ref 22–32)
Calcium: 8.8 mg/dL — ABNORMAL LOW (ref 8.9–10.3)
Chloride: 99 mmol/L (ref 98–111)
Creatinine, Ser: 1.2 mg/dL — ABNORMAL HIGH (ref 0.44–1.00)
GFR, Estimated: 50 mL/min — ABNORMAL LOW (ref >60)
Glucose, Bld: 308 mg/dL — ABNORMAL HIGH (ref 70–99)
Potassium: 4 mmol/L (ref 3.5–5.1)
Sodium: 135 mmol/L (ref 135–145)

## 2020-09-18 LAB — CBC
HCT: 35.5 % — ABNORMAL LOW (ref 36.0–46.0)
Hemoglobin: 11 g/dL — ABNORMAL LOW (ref 12.0–15.0)
MCH: 27 pg (ref 26.0–34.0)
MCHC: 31 g/dL (ref 30.0–36.0)
MCV: 87.2 fL (ref 80.0–100.0)
Platelets: 302 K/uL (ref 150–400)
RBC: 4.07 MIL/uL (ref 3.87–5.11)
RDW: 15.5 % (ref 11.5–15.5)
WBC: 14.4 K/uL — ABNORMAL HIGH (ref 4.0–10.5)
nRBC: 0 % (ref 0.0–0.2)

## 2020-09-18 LAB — HEMOGLOBIN A1C
Hgb A1c MFr Bld: 8.5 % — ABNORMAL HIGH (ref 4.8–5.6)
Mean Plasma Glucose: 197 mg/dL

## 2020-09-18 LAB — GLUCOSE, CAPILLARY
Glucose-Capillary: 365 mg/dL — ABNORMAL HIGH (ref 70–99)
Glucose-Capillary: 259 mg/dL — ABNORMAL HIGH (ref 70–99)
Glucose-Capillary: 359 mg/dL — ABNORMAL HIGH (ref 70–99)
Glucose-Capillary: 246 mg/dL — ABNORMAL HIGH (ref 70–99)
Glucose-Capillary: 105 mg/dL — ABNORMAL HIGH (ref 70–99)

## 2020-09-18 MED ORDER — INSULIN ASPART 100 UNIT/ML IJ SOLN
4.0000 [IU] | Freq: Three times a day (TID) | INTRAMUSCULAR | Status: DC
  Administered 2020-09-18 – 2020-09-19 (×3): 4 [IU] via SUBCUTANEOUS

## 2020-09-18 NOTE — Progress Notes (Addendum)
      [address]       Sheila [name] [address]             [phone]        1 Day Post-Op Procedure(s) (LRB): REVERSE SHOULDER ARTHROPLASTY (Right) Subjective: Awake ane alert, says she feels good, no concerns related to breathing or the pleural tube.   Objective: Vital signs in last 24 hours: Temp:  [97 F (36.1 C)-98.6 F (37 C)] 98.6 F (37 C) (06/02 0322) Pulse Rate:  [66-97] 66 (06/02 0322) Cardiac Rhythm: Normal sinus rhythm (06/01 1945) Resp:  [15-30] 16 (06/02 0322) BP: (114-185)/(57-137) 130/74 (06/02 0322) SpO2:  [87 %-100 %] 92 % (06/02 0322)     Intake/Output from previous day: 06/01 0701 - 06/02 0700 In: 1500 [I.V.:1200; IV Piggyback:300] Out: 550 [Urine:500; Blood:50] Intake/Output this shift: No intake/output data recorded.  General appearance: alert, cooperative and no distress Heart: regular rate and rhythm Lungs: Breath sounds are clear, no air leak, no pleural drainage.  The pleural tube insertion site is clean and dry, tubing connections are secure.   Lab Results: Recent Labs    09/17/20 1402 09/18/20 0235  WBC 16.2* 14.4*  HGB 11.7* 11.0*  HCT 37.4 35.5*  PLT 285 302   BMET:  Recent Labs    09/17/20 1402 09/18/20 0235  NA 135 135  K 3.5 4.0  CL 99 99  CO2 24 24  GLUCOSE 224* 308*  BUN 25* 22  CREATININE 1.16* 1.20*  CALCIUM 9.2 8.8*    PT/INR: No results for input(s): LABPROT, INR in the last 72 hours. ABG No results found for: PHART, HCO3, TCO2, ACIDBASEDEF, O2SAT CBG (last 3)  Recent Labs    09/17/20 1629 09/17/20 2150 09/18/20 0605  GLUCAP 234* 365* 259*    Assessment/Plan: S/P Procedure(s) (LRB): REVERSE SHOULDER ARTHROPLASTY (Right)  -POD1 right pleural tube insertion for pneumothorax discovered after general anesthesia for right shoulder arthroplasty.  No air leak observed over night per RN, no air leak this morning.  The chest tube was placed to water seal at 07:10am. Will check a CXR at 12  noon.      LOS: 0 days    Sheila [name], [place] [phone] 09/18/2020  Agree with above. No leak, and lung is fully expanded on chest x-ray. Placed to waterseal.  We will remove chest tube if stable.  Sheila [name]

## 2020-09-18 NOTE — TOC Initial Note (Signed)
Transition of Care [facility]) - Initial/Assessment Note    Patient Details  Name: Sheila [name] MRN: [id number] Date of Birth: 08-26-1950  Transition of Care [facility]) CM/SW Contact:    Lockie Pares, RN Phone Number: 09/18/2020, 1:58 PM  Clinical Narrative:                  Patient with pneumothorax post shoulder surgery requiring chest tube insertion. Lung now fully inflated. 1200 CXRy. If needs home  Plan on net steps.        Patient Goals and CMS Choice        Expected Discharge Plan and Services           Expected Discharge Date: 09/17/20                                    Prior Living Arrangements/Services                       Activities of Daily Living Home Assistive Devices/Equipment: Gilmer Mor (specify quad or straight) ADL Screening (condition at time of admission) Patient's cognitive ability adequate to safely complete daily activities?: Yes Is the patient deaf or have difficulty hearing?: No Does the patient have difficulty seeing, even when wearing glasses/contacts?: No Does the patient have difficulty concentrating, remembering, or making decisions?: No Patient able to express need for assistance with ADLs?: Yes Does the patient have difficulty dressing or bathing?: No Independently performs ADLs?: Yes (appropriate for developmental age) Does the patient have difficulty walking or climbing stairs?: No Weakness of Legs: None Weakness of Arms/Hands: Right  Permission Sought/Granted                  Emotional Assessment              Admission diagnosis:  Postprocedural pneumothorax [J95.811] Patient Active Problem List   Diagnosis Date Noted  . Postprocedural pneumothorax 09/17/2020  . Diabetes mellitus type 2 in obese (HCC) 09/17/2020  . GERD (gastroesophageal reflux disease) 09/17/2020  . Leukocytosis 09/17/2020  . S/P reverse total shoulder arthroplasty, right 09/17/2020  . Acute respiratory failure with hypoxia (HCC)  09/17/2020  . Gout attack 11/08/2018  . Edema 10/24/2018  . Ankle pain 06/06/2017  . Upper airway cough syndrome 12/16/2015  . Abnormal WBC count 02/26/2015  . Arthritis 01/22/2015  . History of gout 01/22/2015  . Mild intermittent asthma without complication 01/22/2015   PCP:  Ignatius Specking, MD Pharmacy:   [facility] - [place], [place] - [address] [place] [address] Phone: [phone] Fax: [phone]     Social Determinants of Health (SDOH) Interventions    Readmission Risk Interventions No flowsheet data found.

## 2020-09-18 NOTE — Evaluation (Signed)
Physical Therapy Evaluation Patient Details Name: Sheila [name] MRN: [id number] DOB: [birth year] Today's Date: 09/18/2020   History of Present Illness  Pt isa 70 y/o female admitted 09/17/20 from surgical center after R reverse total shoulder due to acute respiratory failure with hypoxia from postprocedural pneumothorax on R side.  S/P chest tube R side 6/1. PMH includes: DM, HTN, Gout.  Clinical Impression  Pt admitted with/for R rev total shoulder.  Pt was initially in pain and limited her mobility.  Needed min guard for basic mobility OOB.   Expect egress/ingress from the bed will be more difficult and will assess next session..  Pt currently limited functionally due to the problems listed below.  (see problems list.)  Pt will benefit from PT to maximize function and safety to be able to get home safely with available assist.     Follow Up Recommendations Home health PT;Supervision - Intermittent;Supervision/Assistance - 24 hour    Equipment Recommendations  None recommended by PT    Recommendations for Other Services       Precautions / Restrictions Precautions Precautions: Shoulder;Fall;Other (comment) Type of Shoulder Precautions: no shoulder ROM, AROM elbow/wrist/hand Shoulder Interventions: Shoulder sling/immobilizer;At all times;Off for dressing/bathing/exercises Precaution Booklet Issued: Yes (comment) Precaution Comments: chest tube removed. Required Braces or Orthoses: Sling Restrictions Weight Bearing Restrictions: Yes RUE Weight Bearing: Non weight bearing      Mobility  Bed Mobility Overal bed mobility: Needs Assistance             General bed mobility comments: OOB in recliner upon entry, pt does not wish to get in bed at this time.    Transfers Overall transfer level: Needs assistance   Transfers: Sit to/from Stand Sit to Stand: Min guard         General transfer comment: use of cane, cues for better use of cane for transfer  stability.  Ambulation/Gait Ambulation/Gait assistance: Supervision Gait Distance (Feet): 30 Feet Assistive device: Straight cane Gait Pattern/deviations: Step-through pattern Gait velocity: slower Gait velocity interpretation: <1.8 ft/sec, indicate of risk for recurrent falls General Gait Details: generally steady, short step/stride length, safe use of the cane for gait  Stairs            Wheelchair Mobility    Modified Rankin (Stroke Patients Only)       Balance Overall balance assessment: Needs assistance Sitting-balance support: No upper extremity supported;Feet supported Sitting balance-Leahy Scale: Fair     Standing balance support: Single extremity supported;No upper extremity supported;During functional activity Standing balance-Leahy Scale: Fair Standing balance comment: relies on 1 UE support dynamically                             Pertinent Vitals/Pain Pain Assessment: Faces Faces Pain Scale: Hurts a little bit Pain Location: L UE Pain Descriptors / Indicators: Operative site guarding;Discomfort Pain Intervention(s): Monitored during session;Limited activity within patient's tolerance    Home Living Family/patient expects to be discharged to:: Private residence Living Arrangements: Spouse/significant other Available Help at Discharge: Family;Available 24 hours/day Type of Home: House Home Access: Stairs to enter Entrance Stairs-Rails: Left Entrance Stairs-Number of Steps: 2 Home Layout: One level Home Equipment: Walker - 2 wheels;Cane - single point;Bedside commode      Prior Function Level of Independence: Needs assistance   Gait / Transfers Assistance Needed: reports using cane for mobility in community and in mornings at home  ADL's / Homemaking Assistance Needed: needs assist for bathing and dressing  from spouse, no IADls        Hand Dominance   Dominant Hand: Left    Extremity/Trunk Assessment        Lower Extremity  Assessment Lower Extremity Assessment: Overall WFL for tasks assessed;RLE deficits/detail (mild proximal weakness, but functional.) RLE Deficits / Details: fallen arch, not fully charcot       Communication   Communication: No difficulties  Cognition Arousal/Alertness: Awake/alert Behavior During Therapy: WFL for tasks assessed/performed Overall Cognitive Status: Within Functional Limits for tasks assessed                                        General Comments General comments (skin integrity, edema, etc.): reports her spouse can assist as needed at d/c    Exercises     Assessment/Plan    PT Assessment Patient needs continued PT services  PT Problem List Decreased strength;Decreased range of motion;Decreased activity tolerance;Decreased balance;Decreased mobility;Decreased knowledge of use of DME;Pain       PT Treatment Interventions DME instruction;Gait training;Stair training;Functional mobility training;Therapeutic activities;Patient/family education;Balance training    PT Goals (Current goals can be found in the Care Plan section)  Acute Rehab PT Goals Patient Stated Goal: to feel better and get home PT Goal Formulation: With patient Time For Goal Achievement: 09/25/20 Potential to Achieve Goals: Good    Frequency Min 3X/week   Barriers to discharge        Co-evaluation               AM-PAC PT "6 Clicks" Mobility  Outcome Measure Help needed turning from your back to your side while in a flat bed without using bedrails?: A Lot Help needed moving from lying on your back to sitting on the side of a flat bed without using bedrails?: A Lot Help needed moving to and from a bed to a chair (including a wheelchair)?: A Little Help needed standing up from a chair using your arms (e.g., wheelchair or bedside chair)?: A Little Help needed to walk in hospital room?: A Little Help needed climbing 3-5 steps with a railing? : A Little 6 Click Score:  16    End of Session Equipment Utilized During Treatment:  (sling) Activity Tolerance: Patient tolerated treatment well Patient left: in chair;with call bell/phone within reach Nurse Communication: Mobility status PT Visit Diagnosis: Other abnormalities of gait and mobility (R26.89);Pain Pain - Right/Left: Right Pain - part of body: Arm    Time: 1735-1758 PT Time Calculation (min) (ACUTE ONLY): 23 min   Charges:   PT Evaluation $PT Eval Moderate Complexity: 1 Mod PT Treatments $Gait Training: 8-22 mins        09/18/2020  Jacinto Halim., PT Acute Rehabilitation Services [phone]  (pager) [phone]  (office)  Eliseo Gum Taeden Geller 09/18/2020, 6:08 PM

## 2020-09-18 NOTE — Evaluation (Signed)
Occupational Therapy Evaluation Patient Details Name: Sheila [name] MRN: [id number] DOB: 09-12-1950 Today's Date: 09/18/2020    History of Present Illness Pt isa 70 y/o female admitted 09/17/20 from surgical center after R reverse total shoulder due to acute respiratory failure with hypoxia from postprocedural pneumothorax on R side.  S/P chest tube R side 6/1. PMH includes: DM, HTN, Gout.   Clinical Impression   PTA patient reports needing some assist from spouse for ADLs, using cane for mobility. Admitted for above and presenting with problem list below, including impaired balance, decreased activity tolerance, decreased functional use of R UE s/p surgery.  Patient currently requires up to max assist for ADLs, min assist for transfers.  She was educated on shoulder precautions, ADL compensatory techniques and sling mgmt/use.  She reports her spouse will assist her at dc, as he was assisting her prior to admission.  Believe she will benefit from further OT services while admitted to optimize independence and safety with ADLs, but anticipate she can follow up for post surgery therapy at discretion of surgeon.     Follow Up Recommendations  No OT follow up;Follow surgeon's recommendation for DC plan and follow-up therapies;Supervision - Intermittent    Equipment Recommendations  None recommended by OT    Recommendations for Other Services       Precautions / Restrictions Precautions Precautions: Shoulder;Fall;Other (comment) Type of Shoulder Precautions: no shoulder ROM, AROM elbow/wrist/hand Shoulder Interventions: Shoulder sling/immobilizer;At all times;Off for dressing/bathing/exercises Precaution Booklet Issued: Yes (comment) Precaution Comments: R chest tube Required Braces or Orthoses: Sling Restrictions Weight Bearing Restrictions: Yes RUE Weight Bearing: Non weight bearing      Mobility Bed Mobility               General bed mobility comments: OOB in recliner upon  entry    Transfers Overall transfer level: Needs assistance   Transfers: Sit to/from Stand Sit to Stand: Min assist         General transfer comment: to steady    Balance Overall balance assessment: Needs assistance Sitting-balance support: No upper extremity supported;Feet supported Sitting balance-Leahy Scale: Fair     Standing balance support: Single extremity supported;No upper extremity supported;During functional activity Standing balance-Leahy Scale: Fair Standing balance comment: relies on 1 UE support dynamically                           ADL either performed or assessed with clinical judgement   ADL Overall ADL's : Needs assistance/impaired     Grooming: Set up;Sitting   Upper Body Bathing: Moderate assistance;Sitting   Lower Body Bathing: Moderate assistance;Sit to/from stand   Upper Body Dressing : Maximal assistance;Sitting   Lower Body Dressing: Moderate assistance;Sit to/from stand   Toilet Transfer: Minimal assistance Statistician Details (indicate cue type and reason): simulated in room         Functional mobility during ADLs: Minimal assistance;Cueing for safety General ADL Comments: pt limited by R shoulder replacement, decreased activity tolerance, balance     Vision   Vision Assessment?: No apparent visual deficits     Perception     Praxis      Pertinent Vitals/Pain Pain Assessment: Faces Faces Pain Scale: Hurts a little bit Pain Location: L UE Pain Descriptors / Indicators: Operative site guarding;Discomfort Pain Intervention(s): Limited activity within patient's tolerance;Monitored during session;Repositioned;Ice applied     Hand Dominance Left   Extremity/Trunk Assessment Upper Extremity Assessment Upper Extremity Assessment: LUE deficits/detail LUE Deficits /  Details: s/p shoulder surgery, WFL elbow/wrist/hand LUE: Unable to fully assess due to immobilization LUE Sensation: WNL LUE Coordination:  decreased fine motor;decreased gross motor   Lower Extremity Assessment Lower Extremity Assessment: Defer to PT evaluation       Communication Communication Communication: No difficulties   Cognition Arousal/Alertness: Awake/alert Behavior During Therapy: WFL for tasks assessed/performed Overall Cognitive Status: Within Functional Limits for tasks assessed                                     General Comments  reports spouse can assist at dc    Exercises Exercises: Shoulder;Other exercises Shoulder Exercises Elbow Flexion: AAROM;Right;5 reps;Seated Elbow Extension: AAROM;Right;5 reps;Seated Wrist Flexion: AROM;Right;5 reps;Seated Wrist Extension: AROM;Right;5 reps;Seated Digit Composite Flexion: AROM;Right;5 reps;Seated Composite Extension: AROM;Right;5 reps;Seated Other Exercises Other Exercises: AAROM supination/promation R 5 reps seated   Shoulder Instructions Shoulder Instructions Donning/doffing shirt without moving shoulder: Maximal assistance Method for sponge bathing under operated UE: Maximal assistance Donning/doffing sling/immobilizer: Maximal assistance Correct positioning of sling/immobilizer: Maximal assistance ROM for elbow, wrist and digits of operated UE: Minimal assistance Sling wearing schedule (on at all times/off for ADL's): Supervision/safety Proper positioning of operated UE when showering: Minimal assistance Positioning of UE while sleeping: Minimal assistance    Home Living Family/patient expects to be discharged to:: Private residence Living Arrangements: Spouse/significant other Available Help at Discharge: Family;Available 24 hours/day Type of Home: House Home Access: Stairs to enter [employer] of Steps: 2 Entrance Stairs-Rails: Left Home Layout: One level     Bathroom Shower/Tub: Producer, television/film/video: Handicapped height     Home Equipment: Environmental consultant - 2 wheels;Cane - single point;Bedside commode           Prior Functioning/Environment Level of Independence: Needs assistance  Gait / Transfers Assistance Needed: reports using cane for mobility in community and in mornings at home ADL's / Homemaking Assistance Needed: needs assist for bathing and dressing from spouse, no IADls            OT Problem List: Decreased strength;Decreased activity tolerance;Pain;Impaired UE functional use;Obesity;Decreased knowledge of precautions;Decreased knowledge of use of DME or AE;Decreased safety awareness;Impaired balance (sitting and/or standing)      OT Treatment/Interventions: Self-care/ADL training;DME and/or AE instruction;Therapeutic activities;Balance training;Patient/family education;Therapeutic exercise    OT Goals(Current goals can be found in the care plan section) Acute Rehab OT Goals Patient Stated Goal: to feel better and get home OT Goal Formulation: With patient Time For Goal Achievement: 10/02/20 Potential to Achieve Goals: Good  OT Frequency: Min 2X/week   Barriers to D/C:            Co-evaluation              AM-PAC OT "6 Clicks" Daily Activity     Outcome Measure Help from another person eating meals?: A Little Help from another person taking care of personal grooming?: A Little Help from another person toileting, which includes using toliet, bedpan, or urinal?: A Lot Help from another person bathing (including washing, rinsing, drying)?: A Lot Help from another person to put on and taking off regular upper body clothing?: A Lot Help from another person to put on and taking off regular lower body clothing?: A Lot 6 Click Score: 14   End of Session Equipment Utilized During Treatment: Other (comment) (sling) Nurse Communication: Mobility status  Activity Tolerance: Patient tolerated treatment well Patient left: in chair;with call  bell/phone within reach  OT Visit Diagnosis: Other abnormalities of gait and mobility (R26.89);Muscle weakness (generalized)  (M62.81);Pain Pain - Right/Left: Right Pain - part of body: Shoulder                Time: 7062-3762 OT Time Calculation (min): 30 min Charges:  OT General Charges $OT Visit: 1 Visit OT Evaluation $OT Eval Moderate Complexity: 1 Mod OT Treatments $Self Care/Home Management : 8-22 mins  Barry Brunner, OT Acute Rehabilitation Services Pager [phone] Office [phone]   Chancy Milroy 09/18/2020, 10:10 AM

## 2020-09-18 NOTE — Progress Notes (Addendum)
   ORTHOPAEDIC PROGRESS NOTE  s/p Procedure(s): REVERSE SHOULDER ARTHROPLASTY, admitted to [facility] for right-sided pneumothorax  SUBJECTIVE: Patient reports no pain around her shoulder.  She is quite happy with the shoulder currently.  Block is wearing off.  OBJECTIVE: EO:FHQRFXJO CDI and sling well fitting,  full and painless ROM throughout hand with DPC of 0.  Axillary nerve sensation/motor altered in setting of block and unable to be fully tested.  Distal motor and sensory altered in setting of block.   Vitals:   09/18/20 0322 09/18/20 0747  BP: 130/74 (!) 182/75  Pulse: 66 67  Resp: 16 18  Temp: 98.6 F (37 C) 98.3 F (36.8 C)  SpO2: 92% 96%     ASSESSMENT: Lakeyshia Tuckerman is a 70 y.o. female doing well postoperatively.  Shoulder appears to be doing quite well.  We will have occupational therapy see her to just teach her how to use her sling.  We will have set up outpatient therapy with my office.  Okay for discharge from orthopedic perspective when stable from medical and cardiothoracic standpoint.  Discharge instructions already placed, prescription already sent in.  PLAN: Weightbearing: NWB RUE, once block is off okay for use at waist level in front of chest for ADLs, no shoulder extension for 3 months Insicional and dressing care: Dressings left intact until follow-up Orthopedic device(s): None Showering: As needed VTE prophylaxis: Aspirin 81mg  BID  Pain control: As needed meds, postop meds already sent to the pharmacy Follow - up plan: 1 week Contact information:  Weekdays 8-5 12-21-2005 PA-C calling our office at [facility], After hours and holidays please check [email or web address] for group call information for [facility]

## 2020-09-18 NOTE — Progress Notes (Signed)
Inpatient Diabetes Program Recommendations  AACE/ADA: New Consensus Statement on Inpatient Glycemic Control (2015)  Target Ranges:  Prepandial:   less than 140 mg/dL      Peak postprandial:   less than 180 mg/dL (1-2 hours)      Critically ill patients:  140 - 180 mg/dL   Lab Results  Component Value Date   GLUCAP 359 (H) 09/18/2020   HGBA1C 8.5 (H) 09/17/2020    Review of Glycemic Control Results for Sheila [name], Sheila [name] (MRN [id number]) as of 09/18/2020 14:35  Ref. Range 09/17/2020 16:29 09/17/2020 21:50 09/18/2020 06:05 09/18/2020 11:03  Glucose-Capillary Latest Ref Range: 70 - 99 mg/dL 568 (H) 127 (H) 517 (H) 359 (H)   Diabetes history: Type 2 DM Outpatient Diabetes medications: Tresiba 52 units QPM, Novolog 16 units BID, Jardiance 12.5 mg QD Current orders for Inpatient glycemic control: Lantus 42 units QHS, Novolog 0-15 units TID Decadron 5 mg x 1  Inpatient Diabetes Program Recommendations:    If plan to remain inpatient, consider increasing Lantus to home dose of 52 units QHS and add Novolog 4 units TID (assuming patient is consuming >50% of meal).   Thanks, Lujean Rave, MSN, RNC-OB Diabetes Coordinator [phone] (8a-5p)<

## 2020-09-18 NOTE — Progress Notes (Signed)
R Pleural CT removed, pt tolerated well.  VSS, no discomfort at this time.

## 2020-09-18 NOTE — Progress Notes (Signed)
PROGRESS NOTE    Sheila [name]  BJY:[id number] DOB: 10-Jan-1951 DOA: 09/17/2020 PCP: Ignatius Specking, MD    No chief complaint on file.   Brief Narrative:   Sheila [name] is a 70 y.o. 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 Chest x-ray showed pneumothorax of the right with complete collapse of the right lung.  They reached out to Dr. Cliffton Asters of cardiothoracic surgery after x-ray imaging for placement of a chest tube.  [facility] was called to admit for the pneumothorax.   Assessment & Plan:   Principal Problem:   Postprocedural pneumothorax Active Problems:   History of gout   Diabetes mellitus type 2 in obese (HCC)   GERD (gastroesophageal reflux disease)   Leukocytosis   S/P reverse total shoulder arthroplasty, right   Acute respiratory failure with hypoxia (HCC)   S/p reverse total shoulder arthroplasty:  Pain control and physical therapy.    Post procedural pneumothorax:  S/p chest tube placement.  Appreciate CTVS recommendations.     Type 2 dm:  CBG (last 3)  Recent Labs    09/17/20 2150 09/18/20 0605 09/18/20 1103  GLUCAP 365* 259* 359*   Resume lantus and SSI.  Uncontrolled with hyperglycemia.    Acute respiratory failure with hypoxia:  Resolved .    DVT prophylaxis: (LovenoX) Code Status: (Full code) Family Communication: none at bedside.  Disposition:   Status is: Observation  The patient will require care spanning > 2 midnights and should be moved to inpatient because: Ongoing diagnostic testing needed not appropriate for outpatient work up and IV treatments appropriate due to intensity of illness or inability to take PO  Dispo: The patient is from: Home               Anticipated d/c is to: PENDING              Patient currently is not medically stable to d/c.   Difficult to place patient No       Consultants:   CARDIOTHORACIC SURGERY.    Procedures: chest tube placement by CTVS  Antimicrobials:  Antibiotics Given (last 72 hours)    Date/Time Action Medication Dose Rate   09/17/20 0850 Given   ceFAZolin (ANCEF) IVPB 2g/100 mL premix 2 g    09/17/20 0955 Given   vancomycin (VANCOCIN) powder 1,000 mg    09/17/20 2112 New Bag/Given   ceFAZolin (ANCEF) IVPB 2g/100 mL premix 2 g 200 mL/hr   09/18/20 0344 New Bag/Given   ceFAZolin (ANCEF) IVPB 2g/100 mL premix 2 g 200 mL/hr          Subjective: Sob has improved.   Objective: Vitals:   09/17/20 2200 09/17/20 2328 09/18/20 0322 09/18/20 0747  BP: (!) 141/72 (!) 150/82 130/74 (!) 182/75  Pulse:  69 66 67  Resp: 20 (!) 23 16 18   Temp:  98.6 F (37 C) 98.6 F (37 C) 98.3 F (36.8 C)  TempSrc:  Oral Oral Oral  SpO2: 97% 95% 92% 96%  Weight:      Height:        Intake/Output Summary (Last 24 hours) at 09/18/2020 0841 Last data filed at 09/18/2020 0829 Gross per 24 hour  Intake 1780 ml  Output 550 ml  Net 1230 ml   11/18/2020  08/25/20 1350 09/17/20 0655  Weight: 85.7 kg 87.7 kg    Examination:  General exam: Appears calm and comfortable  Respiratory system: Clear to auscultation. Respiratory effort normal. Chest tube placed to water seal . Cardiovascular system: S1 & S2 heard, RRR. No JVD, . No pedal edema. Gastrointestinal system: Abdomen is nondistended, soft and nontender.Normal bowel sounds heard. Central nervous system: Alert and oriented. No focal neurological deficits. Extremities: Symmetric 5 x 5 power. Skin: No rashes, lesions or ulcers Psychiatry: Mood & affect appropriate.     Data Reviewed: I have personally reviewed following labs and imaging studies  CBC: Recent Labs  Lab 09/17/20 1402 09/18/20 0235  WBC 16.2* 14.4*  HGB 11.7* 11.0*  HCT 37.4  35.5*  MCV 88.0 87.2  PLT 285 302    Basic Metabolic Panel: Recent Labs  Lab 09/17/20 1402 09/18/20 0235  NA 135 135  K 3.5 4.0  CL 99 99  CO2 24 24  GLUCOSE 224* 308*  BUN 25* 22  CREATININE 1.16* 1.20*  CALCIUM 9.2 8.8*    GFR: Estimated Creatinine Clearance: 44.8 mL/min (A) (by C-G formula based on SCr of 1.2 mg/dL (H)).  Liver Function Tests: Recent Labs  Lab 09/17/20 1402  AST 36  ALT 34  ALKPHOS 113  BILITOT 0.4  PROT 7.8  ALBUMIN 3.4*    CBG: Recent Labs  Lab 09/17/20 1036 09/17/20 1251 09/17/20 1629 09/17/20 2150 09/18/20 0605  GLUCAP 208* 202* 234* 365* 259*     Recent Results (from the past 240 hour(s))  Surgical pcr screen     Status: None   Collection Time: 09/10/20  1:22 PM   Specimen: Nasal Mucosa; Nasal Swab  Result Value Ref Range Status   MRSA, PCR NEGATIVE NEGATIVE Final   Staphylococcus aureus NEGATIVE NEGATIVE Final    Comment: (NOTE) The Xpert SA Assay (FDA approved for NASAL specimens in patients 26 years of age and older), is one component of a comprehensive surveillance program. It is not intended to diagnose infection nor to guide or monitor treatment. Performed at [facility], [address] [place], [place] [address]          Radiology Studies: DG Chest 1 View  Result Date: 09/17/2020 CLINICAL DATA:  Shortness of breath following shoulder surgery. Pneumothorax suspected. EXAM: CHEST  1 VIEW COMPARISON:  None. FINDINGS: Pneumothorax on the right with essentially complete collapse of the right lung but no evidence of tension. No midline shift or flattening of the right hemidiaphragm. Left chest remains clear. IMPRESSION: Pneumothorax on the right with complete collapse of the right lung but no evidence of tension. Call report in progress. Electronically Signed   By: Paulina Fusi M.D.   On: 09/17/2020 10:55   DG Chest Port 1 View  Result Date: 09/18/2020 CLINICAL DATA:  Chest tube in place for prior pneumothorax  EXAM: PORTABLE CHEST 1 VIEW COMPARISON:  September 17, 2020 FINDINGS: Chest tube again noted on the right without demonstrable pneumothorax. Bibasilar atelectasis noted. Lungs otherwise clear. Heart is mildly enlarged with pulmonary vascularity normal, stable. No adenopathy. Total shoulder replacement on the right. IMPRESSION: Chest tube remains on the right without apparent pneumothorax. Bibasilar atelectasis. No new opacity. Stable cardiac silhouette. Electronically Signed   By: Bretta Bang III M.D.   On: 09/18/2020 08:11   DG Chest Port 1 View  Result Date: 09/17/2020 CLINICAL DATA:  Pneumothorax EXAM: PORTABLE CHEST 1 VIEW COMPARISON:  Portable exam 1552 hours compared to 1036 hours FINDINGS: Interval  placement of pigtail RIGHT thoracostomy tube with resolution of previously identified RIGHT pneumothorax. Incomplete re-expansion of RIGHT lung with partial basilar and medial RIGHT upper lobe atelectasis. Enlargement of cardiac silhouette. LEFT lung clear. Bones demineralized with note of a reverse RIGHT shoulder arthroplasty. IMPRESSION: Resolution of previously seen RIGHT pneumothorax post thoracostomy tube insertion. Partial atelectasis of medial RIGHT upper lobe with subsegmental atelectasis at RIGHT base. Electronically Signed   By: Ulyses Southward M.D.   On: 09/17/2020 16:09   DG Shoulder Right Port  Result Date: 09/17/2020 CLINICAL DATA:  Status post total shoulder replacement EXAM: PORTABLE RIGHT SHOULDER: 1 V COMPARISON:  None. FINDINGS: Frontal view obtained. Patient is status post total shoulder replacement prosthetic components well-seated on frontal view. No fracture or dislocation. Air within the right shoulder joint is an expected postoperative finding. There is an essentially complete pneumothorax on the right. IMPRESSION: Total shoulder replacement right with prosthetic components well-seated. No acute fracture or dislocation. Nearly complete pneumothorax on the right noted. Critical  Value/emergent results were called by telephone at the time of interpretation on 09/17/2020 at 11:02 am to provider [facility] , who verbally acknowledged these results. Electronically Signed   By: Bretta Bang III M.D.   On: 09/17/2020 11:03        Scheduled Meds: . chlorhexidine  15 mL Mouth Rinse BID  . colchicine-probenecid  1 tablet Oral Q breakfast  . docusate sodium  100 mg Oral BID  . enoxaparin (LOVENOX) injection  30 mg Subcutaneous Q24H  . insulin aspart  0-15 Units Subcutaneous TID WC  . insulin glargine  42 Units Subcutaneous Q2200  . mouth rinse  15 mL Mouth Rinse q12n4p  . pantoprazole  40 mg Oral Daily  . sodium chloride flush  3 mL Intravenous Q12H   Continuous Infusions: .  ceFAZolin (ANCEF) IV Stopped (09/18/20 0414)     LOS: 0 days        Kathlen Mody, MD [facility]   To contact the attending provider between 7A-7P or the covering provider during after hours 7P-7A, please log into the web site [email or web address] and access using universal [place] password for that web site. If you do not have the password, please call the hospital operator.  09/18/2020, 8:41 AM

## 2020-09-19 DIAGNOSIS — E669 Obesity, unspecified: Secondary | ICD-10-CM

## 2020-09-19 DIAGNOSIS — E1169 Type 2 diabetes mellitus with other specified complication: Secondary | ICD-10-CM | POA: Diagnosis not present

## 2020-09-19 DIAGNOSIS — J9601 Acute respiratory failure with hypoxia: Secondary | ICD-10-CM | POA: Diagnosis not present

## 2020-09-19 DIAGNOSIS — K219 Gastro-esophageal reflux disease without esophagitis: Secondary | ICD-10-CM | POA: Diagnosis not present

## 2020-09-19 DIAGNOSIS — I10 Essential (primary) hypertension: Secondary | ICD-10-CM

## 2020-09-19 DIAGNOSIS — J95811 Postprocedural pneumothorax: Secondary | ICD-10-CM

## 2020-09-19 LAB — GLUCOSE, CAPILLARY
Glucose-Capillary: 232 mg/dL — ABNORMAL HIGH (ref 70–99)
Glucose-Capillary: 219 mg/dL — ABNORMAL HIGH (ref 70–99)

## 2020-09-19 MED ORDER — HYDROCODONE-ACETAMINOPHEN 7.5-325 MG PO TABS
1.0000 | ORAL_TABLET | Freq: Once | ORAL | Status: AC
  Administered 2020-09-19: 1 via ORAL

## 2020-09-19 MED ORDER — DOCUSATE SODIUM 100 MG PO CAPS: 100.0000 mg | ORAL_CAPSULE | Freq: Two times a day (BID) | ORAL | 0 refills | Status: DC

## 2020-09-19 MED ORDER — POLYETHYLENE GLYCOL 3350 17 G PO PACK: 17.0000 g | PACK | Freq: Every day | ORAL | 0 refills | Status: DC

## 2020-09-19 NOTE — Progress Notes (Signed)
   ORTHOPAEDIC PROGRESS NOTE  s/p Procedure(s): REVERSE SHOULDER ARTHROPLASTY, admitted to [facility] for right-sided pneumothorax  SUBJECTIVE: Patient reports she had a lot of pain in the shoulder yesterday. It has improved some today. Asking when she can go home.   OBJECTIVE: PE: New aquacel placed. Dressing CDI and sling well fitting. + Motor in  AIN, PIN, Ulnar distributions. She endorses axillary nerve sensation.  Sensation intact in medial, radial, and ulnar distributions. Well perfused digits.   Vitals:   09/19/20 0326 09/19/20 0638  BP: (!) 189/89 (!) 157/82  Pulse: 86   Resp: 20   Temp: 98.2 F (36.8 C)   SpO2: 97%      ASSESSMENT: Noami Bove is a 70 y.o. female doing well postoperatively. POD#2  Shoulder appears to be doing quite well.  Outpatient Therapy has been set up for the patient to start next week.   Okay for discharge from orthopedic perspective when stable from medical and cardiothoracic standpoint.  Discharge instructions already placed, prescription already sent in.  PLAN: Weightbearing: NWB RUE, once block is off okay for use at waist level in front of chest for ADLs, no shoulder extension for 3 months Insicional and dressing care: Dressings left intact until follow-up Orthopedic device(s): None Showering: As needed VTE prophylaxis: Aspirin 81mg  BID  Pain control: As needed meds, postop meds already sent to the pharmacy Follow - up plan: 1 week Contact information:  Weekdays 8-5 12-21-2005 PA-C calling our office at [facility], After hours and holidays please check [email or web address] for group call information for [facility]  [phone], PA-C 09/19/2020

## 2020-09-19 NOTE — Progress Notes (Signed)
Occupational Therapy Treatment Patient Details Name: Sheila [name] MRN: [id number] DOB: 1950/10/26 Today's Date: 09/19/2020    History of present illness Pt isa 70 y/o female admitted 09/17/20 from surgical center after R reverse total shoulder due to acute respiratory failure with hypoxia from postprocedural pneumothorax on R side.  S/P chest tube R side 6/1. PMH includes: DM, HTN, Gout.   OT comments  Pt. Seen for skilled OT treatment. Eager for participation as she wants to go home because she misses her dog very much.  Able to complete bed mobility max a and states husband will be able to assist her with this portion or she will sleep in recliner initially.  toileting task min guard a.  R AROM elbow/wrist/hand with good return.  Pt. Also able to guide donning and doffing of sling without cues.    Follow Up Recommendations  No OT follow up;Follow surgeon's recommendation for DC plan and follow-up therapies;Supervision - Intermittent    Equipment Recommendations  None recommended by OT    Recommendations for Other Services      Precautions / Restrictions Precautions Precautions: Shoulder;Fall;Other (comment) Type of Shoulder Precautions: no shoulder ROM, AROM elbow/wrist/hand Shoulder Interventions: Shoulder sling/immobilizer;At all times;Off for dressing/bathing/exercises Required Braces or Orthoses: Sling Restrictions RUE Weight Bearing: Non weight bearing       Mobility Bed Mobility Overal bed mobility: Needs Assistance Bed Mobility: Supine to Sit     Supine to sit: Max assist     General bed mobility comments: pt. reports she exits on R side as she has a ramp for her dog on the L side and can not switch side of the dog ramp.  states she used to weight bear through R elbow to bring trunk upright but knows she can not now.  unable to sit up in log sitting priort to beginning exit from bed. instead, she brings b les off of bed and states "this is where my husband would just help  me up".  assisted with guiding pts. trunk upright then she was able to shift and scoot hips to eob without assistance.  pt. also reports she may sleep in recliner initially    Transfers Overall transfer level: Needs assistance   Transfers: Sit to/from Stand;Stand Pivot Transfers Sit to Stand: Min guard Stand pivot transfers: Min guard            Balance                                           ADL either performed or assessed with clinical judgement   ADL Overall ADL's : Needs assistance/impaired                 Upper Body Dressing : Maximal assistance;Sitting Upper Body Dressing Details (indicate cue type and reason): pt. able to guide how to don/doff safely including order of velcro straps without cues     Toilet Transfer: Min guard;Ambulation;Regular Toilet   Toileting- Architect and Hygiene: Supervision/safety;Sitting/lateral lean;Sit to/from stand       Functional mobility during ADLs: Min guard;Cane       Vision       Perception     Praxis      Cognition Arousal/Alertness: Awake/alert Behavior During Therapy: WFL for tasks assessed/performed Overall Cognitive Status: Within Functional Limits for tasks assessed  Exercises Shoulder Exercises Elbow Flexion: AAROM;Right;5 reps;Seated Elbow Extension: AAROM;Right;5 reps;Seated Wrist Flexion: AROM;Right;5 reps;Seated Wrist Extension: AROM;Right;5 reps;Seated Digit Composite Flexion: AROM;Right;5 reps;Seated Composite Extension: AROM;Right;5 reps;Seated   Shoulder Instructions       General Comments      Pertinent Vitals/ Pain       Pain Assessment: 0-10 Pain Score: 2  Pain Location: L UE Pain Descriptors / Indicators: Aching Pain Intervention(s): Limited activity within patient's tolerance;Monitored during session;Repositioned  Home Living                                           Prior Functioning/Environment              Frequency  Min 2X/week        Progress Toward Goals  OT Goals(current goals can now be found in the care plan section)  Progress towards OT goals: Progressing toward goals     Plan      Co-evaluation                 AM-PAC OT "6 Clicks" Daily Activity     Outcome Measure   Help from another person eating meals?: A Little Help from another person taking care of personal grooming?: A Little Help from another person toileting, which includes using toliet, bedpan, or urinal?: A Lot Help from another person bathing (including washing, rinsing, drying)?: A Lot Help from another person to put on and taking off regular upper body clothing?: A Lot Help from another person to put on and taking off regular lower body clothing?: A Lot 6 Click Score: 14    End of Session Equipment Utilized During Treatment: Gait belt (sling and cane)  OT Visit Diagnosis: Other abnormalities of gait and mobility (R26.89);Muscle weakness (generalized) (M62.81);Pain Pain - Right/Left: Right Pain - part of body: Shoulder   Activity Tolerance Patient tolerated treatment well   Patient Left in bed;with call bell/phone within reach   Nurse Communication          Time: 6195-0932 OT Time Calculation (min): 18 min  Charges: OT General Charges $OT Visit: 1 Visit OT Treatments $Therapeutic Exercise: 8-22 mins  Boneta Lucks, COTA/L Acute Rehabilitation [phone]   Alessandra Bevels   09/19/2020, 10:37 AM

## 2020-09-19 NOTE — Progress Notes (Addendum)
[address]       [name] [address]             [phone]      2 Days Post-Op Procedure(s) (LRB): REVERSE SHOULDER ARTHROPLASTY (Right) Subjective: Feels ok "breathing feels normal"  Objective: Vital signs in last 24 hours: Temp:  [98.2 F (36.8 C)-98.9 F (37.2 C)] 98.2 F (36.8 C) (06/03 0326) Pulse Rate:  [67-86] 86 (06/03 0326) Cardiac Rhythm: Normal sinus rhythm (06/03 0006) Resp:  [17-23] 20 (06/03 0326) BP: (155-189)/(63-101) 157/82 (06/03 0638) SpO2:  [93 %-98 %] 97 % (06/03 0326)  Hemodynamic parameters for last 24 hours:    Intake/Output from previous day: 06/02 0701 - 06/03 0700 In: 300 [P.O.:300] Out: 0  Intake/Output this shift: No intake/output data recorded.  General appearance: alert, cooperative and no distress Heart: regular rate and rhythm Lungs: clear to auscultation bilaterally  Lab Results: Recent Labs    09/17/20 1402 09/18/20 0235  WBC 16.2* 14.4*  HGB 11.7* 11.0*  HCT 37.4 35.5*  PLT 285 302   BMET:  Recent Labs    09/17/20 1402 09/18/20 0235  NA 135 135  K 3.5 4.0  CL 99 99  CO2 24 24  GLUCOSE 224* 308*  BUN 25* 22  CREATININE 1.16* 1.20*  CALCIUM 9.2 8.8*    PT/INR: No results for input(s): LABPROT, INR in the last 72 hours. ABG No results found for: PHART, HCO3, TCO2, ACIDBASEDEF, O2SAT CBG (last 3)  Recent Labs    09/18/20 1708 09/18/20 2119 09/19/20 0640  GLUCAP 246* 105* 232*    Meds Scheduled Meds: . chlorhexidine  15 mL Mouth Rinse BID  . colchicine-probenecid  1 tablet Oral Q breakfast  . docusate sodium  100 mg Oral BID  . enoxaparin (LOVENOX) injection  30 mg Subcutaneous Q24H  . insulin aspart  0-15 Units Subcutaneous TID WC  . insulin aspart  4 Units Subcutaneous TID WC  . insulin glargine  42 Units Subcutaneous Q2200  . mouth rinse  15 mL Mouth Rinse q12n4p  . pantoprazole  40 mg Oral Daily  . sodium chloride flush  3 mL Intravenous Q12H   Continuous  Infusions: PRN Meds:.acetaminophen **OR** acetaminophen, albuterol, benzonatate, diphenhydrAMINE, diphenhydrAMINE, HYDROcodone-acetaminophen, HYDROcodone-acetaminophen, menthol-cetylpyridinium **OR** phenol, morphine injection, ondansetron **OR** ondansetron (ZOFRAN) IV, traMADol, traZODone, triamcinolone cream  Xrays DG Chest 1 View  Result Date: 09/17/2020 CLINICAL DATA:  Shortness of breath following shoulder surgery. Pneumothorax suspected. EXAM: CHEST  1 VIEW COMPARISON:  None. FINDINGS: Pneumothorax on the right with essentially complete collapse of the right lung but no evidence of tension. No midline shift or flattening of the right hemidiaphragm. Left chest remains clear. IMPRESSION: Pneumothorax on the right with complete collapse of the right lung but no evidence of tension. Call report in progress. Electronically Signed   By: Paulina Fusi M.D.   On: 09/17/2020 10:55   DG CHEST PORT 1 VIEW  Result Date: 09/18/2020 CLINICAL DATA:  70 year old female with right side pneumothorax following shoulder surgery, treated with chest tube. EXAM: PORTABLE CHEST 1 VIEW COMPARISON:  0802 hours today and earlier. FINDINGS: Portable AP semi upright view at 1201 hours. Stable right pigtail chest tube. Trace right chest wall subcutaneous gas is stable. No pneumothorax is identified. Bibasilar atelectasis is greater on the right. Stable low lung volumes. Mediastinal contours remain normal. Visualized tracheal air column is within normal limits. Stable visible right shoulder arthroplasty. Paucity of bowel gas in the upper abdomen. IMPRESSION:  1. Stable right chest tube with no pneumothorax identified. 2. Continued low lung volumes with and atelectasis. Electronically Signed   By: Odessa Fleming M.D.   On: 09/18/2020 12:13   DG Chest Port 1 View  Result Date: 09/18/2020 CLINICAL DATA:  Chest tube in place for prior pneumothorax EXAM: PORTABLE CHEST 1 VIEW COMPARISON:  September 17, 2020 FINDINGS: Chest tube again noted on the  right without demonstrable pneumothorax. Bibasilar atelectasis noted. Lungs otherwise clear. Heart is mildly enlarged with pulmonary vascularity normal, stable. No adenopathy. Total shoulder replacement on the right. IMPRESSION: Chest tube remains on the right without apparent pneumothorax. Bibasilar atelectasis. No new opacity. Stable cardiac silhouette. Electronically Signed   By: Bretta Bang III M.D.   On: 09/18/2020 08:11   DG Chest Port 1 View  Result Date: 09/17/2020 CLINICAL DATA:  Pneumothorax EXAM: PORTABLE CHEST 1 VIEW COMPARISON:  Portable exam 1552 hours compared to 1036 hours FINDINGS: Interval placement of pigtail RIGHT thoracostomy tube with resolution of previously identified RIGHT pneumothorax. Incomplete re-expansion of RIGHT lung with partial basilar and medial RIGHT upper lobe atelectasis. Enlargement of cardiac silhouette. LEFT lung clear. Bones demineralized with note of a reverse RIGHT shoulder arthroplasty. IMPRESSION: Resolution of previously seen RIGHT pneumothorax post thoracostomy tube insertion. Partial atelectasis of medial RIGHT upper lobe with subsegmental atelectasis at RIGHT base. Electronically Signed   By: Ulyses Southward M.D.   On: 09/17/2020 16:09   DG Shoulder Right Port  Result Date: 09/17/2020 CLINICAL DATA:  Status post total shoulder replacement EXAM: PORTABLE RIGHT SHOULDER: 1 V COMPARISON:  None. FINDINGS: Frontal view obtained. Patient is status post total shoulder replacement prosthetic components well-seated on frontal view. No fracture or dislocation. Air within the right shoulder joint is an expected postoperative finding. There is an essentially complete pneumothorax on the right. IMPRESSION: Total shoulder replacement right with prosthetic components well-seated. No acute fracture or dislocation. Nearly complete pneumothorax on the right noted. Critical Value/emergent results were called by telephone at the time of interpretation on 09/17/2020 at 11:02 am to  provider [facility] , who verbally acknowledged these results. Electronically Signed   By: Bretta Bang III M.D.   On: 09/17/2020 11:03    Assessment/Plan: S/P Procedure(s) (LRB): REVERSE SHOULDER ARTHROPLASTY (Right)  1 afeb, + HTN 2 sats good on RA 3 chest tube has been removed 4 no further tx required from CT surgery perspective  LOS: 1 day    Rowe Clack PA-C Pager [phone] 09/19/2020   Agree with above Doing well  CXR stable  Please call with questions  Corliss Skains

## 2020-09-19 NOTE — Discharge Summary (Signed)
Physician Discharge Summary  Sheila [name] KGU:[id number] DOB: 10/18/1950 DOA: 09/17/2020  PCP: Ignatius Specking, MD  Admit date: 09/17/2020 Discharge date: 09/19/2020  Admitted From: Home.  Disposition:  Home.   Recommendations for Outpatient Follow-up:  1. Follow up with PCP in 1-2 weeks 2. Please obtain BMP/CBC in one week Please follow up with orthopedics as recommended.   Discharge Condition: stable.  CODE STATUS: full code.  Diet recommendation: Heart Healthy  Brief/Interim Summary:  Sheila [name] a 70 y.o.femalewith medical history significant ofhypertension, hyperlipidemia, diabetes mellitus type 2, gout, and GERD who underwentright total shoulder arthroplasty fordegenerative joint disease of the right shoulderat the surgery center this morning and subsequently developed shortness of breath following the procedure.Chest x-ray showed pneumothorax of the right with complete collapse of the right lung. They reached out to Dr. Cliffton Asters of cardiothoracic surgery after x-ray imaging for placement of a chest tube. [facility] was called to admit for the pneumothorax.  Discharge Diagnoses:  Principal Problem:   Postprocedural pneumothorax Active Problems:   History of gout   Diabetes mellitus type 2 in obese (HCC)   GERD (gastroesophageal reflux disease)   Leukocytosis   S/P reverse total shoulder arthroplasty, right   Acute respiratory failure with hypoxia (HCC)   S/p reverse total shoulder arthroplasty:  Pain control and physical therapy. Follow up with orthopedics as recommended.    Post procedural pneumothorax:  S/p chest tube placement, removed last night and repeat CXR stable, she is on RA and denies any complaints.  Appreciate CTVS recommendations.     Type 2 dm: uncontrolled with hyperglycemia, insulin dependent. :  Resume home meds.  CBG (last 3)  Recent Labs    09/18/20 2119 09/19/20 0640 09/19/20 1119  GLUCAP 105* 232* 219*      Acute respiratory  failure with hypoxia:  Resolved .     Discharge Instructions  Discharge Instructions    Call MD for:  redness, tenderness, or signs of infection (pain, swelling, redness, odor or green/yellow discharge around incision site)   Complete by: As directed    Call MD for:  severe uncontrolled pain   Complete by: As directed    Call MD for:  temperature >100.4   Complete by: As directed    Diet - low sodium heart healthy   Complete by: As directed    Diet - low sodium heart healthy   Complete by: As directed    Discharge instructions   Complete by: As directed    Please follow up with PCP in one week.   Increase activity slowly   Complete by: As directed    No wound care   Complete by: As directed      Allergies as of 09/19/2020      Reactions   Lisinopril Diarrhea   Metformin Diarrhea      Medication List    STOP taking these medications   Aleve PM 220-25 MG Tabs Generic drug: Naproxen Sod-diphenhydrAMINE   GOODY HEADACHE PO   hydrOXYzine 10 MG tablet Commonly known as: ATARAX/VISTARIL   hydrOXYzine 25 MG tablet Commonly known as: ATARAX/VISTARIL   meloxicam 15 MG tablet Commonly known as: MOBIC   traMADol 50 MG tablet Commonly known as: ULTRAM   traZODone 50 MG tablet Commonly known as: DESYREL     TAKE these medications   acetaminophen 500 MG tablet Commonly known as: TYLENOL Take 2 tablets (1,000 mg total) by mouth every 8 (eight) hours for 14 days.   Aspercreme Lidocaine 4 % Crea  Generic drug: Lidocaine HCl Apply 1 application topically as needed (for pain).   aspirin 81 MG chewable tablet Commonly known as: Aspirin Childrens Chew 1 tablet (81 mg total) by mouth 2 (two) times daily. For DVT prophylaxis after surgery   atorvastatin 20 MG tablet Commonly known as: LIPITOR Take 20 mg by mouth at bedtime.   celecoxib 100 MG capsule Commonly known as: CeleBREX Take 1 capsule (100 mg total) by mouth 2 (two) times daily. For pain / inflammation What  changed: additional instructions   colchicine-probenecid 0.5-500 MG tablet Take 1 tablet by mouth daily.   Contour Next Test test strip Generic drug: glucose blood 4 (four) times daily. for testing   docusate sodium 100 MG capsule Commonly known as: COLACE Take 1 capsule (100 mg total) by mouth 2 (two) times daily.   furosemide 40 MG tablet Commonly known as: LASIX Take 40 mg by mouth 2 (two) times daily.   Global Ease Inject Pen Needles 31G X 5 MM Misc Generic drug: Insulin Pen Needle 4 (four) times daily.   Jardiance 25 MG Tabs tablet Generic drug: empagliflozin Take 12.5 mg by mouth in the morning.   NovoLOG FlexPen 100 UNIT/ML FlexPen Generic drug: insulin aspart Inject 16 Units into the skin See admin instructions. Inject 16 units into thee skin two times a day before meals, per sliding scale   omeprazole 20 MG capsule Commonly known as: PRILOSEC Take 20 mg by mouth daily before breakfast. What changed: Another medication with the same name was added. Make sure you understand how and when to take each.   omeprazole 20 MG capsule Commonly known as: PRILOSEC Take 1 capsule (20 mg total) by mouth daily. To gastric protection while taking NSAIDs What changed: You were already taking a medication with the same name, and this prescription was added. Make sure you understand how and when to take each.   ondansetron 4 MG tablet Commonly known as: Zofran Take 1 tablet (4 mg total) by mouth every 8 (eight) hours as needed for up to 7 days for nausea or vomiting.   oxyCODONE 5 MG immediate release tablet Commonly known as: Oxy IR/ROXICODONE Take 1-2 pills every 6 hrs as needed for severe pain, no more than 6 per day   polyethylene glycol 17 g packet Commonly known as: MIRALAX / GLYCOLAX Take 17 g by mouth daily.   Evaristo Bury FlexTouch 200 UNIT/ML FlexTouch Pen Generic drug: insulin degludec Inject 52 Units into the skin every evening.   triamcinolone cream 0.1  % Commonly known as: KENALOG Apply 1 application topically 3 (three) times daily as needed (for itching).       Follow-up Information    Bjorn Pippin, MD. Call on 09/25/2020.   Specialty: Orthopedic Surgery Why: 430 pm  Contact information: [address] [place] [address] [phone]              Allergies  Allergen Reactions  . Lisinopril Diarrhea  . Metformin Diarrhea    Consultations:  Cardiothoracic surgery.   Orthopedics.    Procedures/Studies: DG Chest 1 View  Result Date: 09/17/2020 CLINICAL DATA:  Shortness of breath following shoulder surgery. Pneumothorax suspected. EXAM: CHEST  1 VIEW COMPARISON:  None. FINDINGS: Pneumothorax on the right with essentially complete collapse of the right lung but no evidence of tension. No midline shift or flattening of the right hemidiaphragm. Left chest remains clear. IMPRESSION: Pneumothorax on the right with complete collapse of the right lung but no evidence  of tension. Call report in progress. Electronically Signed   By: Paulina Fusi M.D.   On: 09/17/2020 10:55   CT SHOULDER RIGHT WO CONTRAST  Result Date: 08/24/2020 CLINICAL DATA:  Right shoulder pain, decreased range of motion EXAM: CT OF THE UPPER RIGHT EXTREMITY WITHOUT CONTRAST TECHNIQUE: Multidetector CT imaging of the upper right extremity was performed according to the standard protocol. COMPARISON:  None. FINDINGS: Bones/Joint/Cartilage No fracture or dislocation. Normal alignment. No joint effusion. Severe osteoarthritis of the glenohumeral joint with severe joint space narrowing, a bone-on-bone appearance, subchondral sclerosis, subchondral cystic changes and marginal osteophytosis. Mild arthropathy of the acromioclavicular joint.  Type I acromion. Ligaments Ligaments are suboptimally evaluated by CT. Muscles and Tendons Muscles are normal.  No muscle atrophy. Soft tissue No fluid collection or hematoma. No soft tissue mass. 3 mm right middle lobe  pulmonary nodule. Second 2 mm right middle lobe pulmonary nodule. IMPRESSION: 1. Severe osteoarthritis of the glenohumeral joint. 2. 3 mm right middle lobe pulmonary nodule. Second 2 mm right middle lobe pulmonary nodule. No follow-up needed if patient is low-risk (and has no known or suspected primary neoplasm). Non-contrast chest CT can be considered in 12 months if patient is high-risk. This recommendation follows the consensus statement: Guidelines for Management of Incidental Pulmonary Nodules Detected on CT Images: From the Fleischner Society 2017; Radiology 2017; 284:228-243. Electronically Signed   By: Elige Ko   On: 08/24/2020 07:27   DG CHEST PORT 1 VIEW  Result Date: 09/18/2020 CLINICAL DATA:  70 year old female with right side pneumothorax following shoulder surgery, treated with chest tube. EXAM: PORTABLE CHEST 1 VIEW COMPARISON:  0802 hours today and earlier. FINDINGS: Portable AP semi upright view at 1201 hours. Stable right pigtail chest tube. Trace right chest wall subcutaneous gas is stable. No pneumothorax is identified. Bibasilar atelectasis is greater on the right. Stable low lung volumes. Mediastinal contours remain normal. Visualized tracheal air column is within normal limits. Stable visible right shoulder arthroplasty. Paucity of bowel gas in the upper abdomen. IMPRESSION: 1. Stable right chest tube with no pneumothorax identified. 2. Continued low lung volumes with and atelectasis. Electronically Signed   By: Odessa Fleming M.D.   On: 09/18/2020 12:13   DG Chest Port 1 View  Result Date: 09/18/2020 CLINICAL DATA:  Chest tube in place for prior pneumothorax EXAM: PORTABLE CHEST 1 VIEW COMPARISON:  September 17, 2020 FINDINGS: Chest tube again noted on the right without demonstrable pneumothorax. Bibasilar atelectasis noted. Lungs otherwise clear. Heart is mildly enlarged with pulmonary vascularity normal, stable. No adenopathy. Total shoulder replacement on the right. IMPRESSION: Chest tube  remains on the right without apparent pneumothorax. Bibasilar atelectasis. No new opacity. Stable cardiac silhouette. Electronically Signed   By: Bretta Bang III M.D.   On: 09/18/2020 08:11   DG Chest Port 1 View  Result Date: 09/17/2020 CLINICAL DATA:  Pneumothorax EXAM: PORTABLE CHEST 1 VIEW COMPARISON:  Portable exam 1552 hours compared to 1036 hours FINDINGS: Interval placement of pigtail RIGHT thoracostomy tube with resolution of previously identified RIGHT pneumothorax. Incomplete re-expansion of RIGHT lung with partial basilar and medial RIGHT upper lobe atelectasis. Enlargement of cardiac silhouette. LEFT lung clear. Bones demineralized with note of a reverse RIGHT shoulder arthroplasty. IMPRESSION: Resolution of previously seen RIGHT pneumothorax post thoracostomy tube insertion. Partial atelectasis of medial RIGHT upper lobe with subsegmental atelectasis at RIGHT base. Electronically Signed   By: Ulyses Southward M.D.   On: 09/17/2020 16:09   DG Shoulder Right Port  Result  Date: 09/17/2020 CLINICAL DATA:  Status post total shoulder replacement EXAM: PORTABLE RIGHT SHOULDER: 1 V COMPARISON:  None. FINDINGS: Frontal view obtained. Patient is status post total shoulder replacement prosthetic components well-seated on frontal view. No fracture or dislocation. Air within the right shoulder joint is an expected postoperative finding. There is an essentially complete pneumothorax on the right. IMPRESSION: Total shoulder replacement right with prosthetic components well-seated. No acute fracture or dislocation. Nearly complete pneumothorax on the right noted. Critical Value/emergent results were called by telephone at the time of interpretation on 09/17/2020 at 11:02 am to provider [facility] , who verbally acknowledged these results. Electronically Signed   By: Bretta Bang III M.D.   On: 09/17/2020 11:03       Subjective:  No new complaints.  Discharge Exam: Vitals:   09/19/20 0756  09/19/20 1124  BP: (!) 159/82 (!) 147/87  Pulse: 94 94  Resp: 20 20  Temp: 98.7 F (37.1 C) 98.8 F (37.1 C)  SpO2: 96% 95%   Vitals:   09/19/20 0326 09/19/20 0638 09/19/20 0756 09/19/20 1124  BP: (!) 189/89 (!) 157/82 (!) 159/82 (!) 147/87  Pulse: 86  94 94  Resp: 20  20 20   Temp: 98.2 F (36.8 C)  98.7 F (37.1 C) 98.8 F (37.1 C)  TempSrc: Axillary  Oral Oral  SpO2: 97%  96% 95%  Weight:      Height:        General: Pt is alert, awake, not in acute distress Cardiovascular: RRR, S1/S2 +, no rubs, no gallops Respiratory: CTA bilaterally, no wheezing, no rhonchi Abdominal: Soft, NT, ND, bowel sounds + Extremities: no edema, no cyanosis    The results of significant diagnostics from this hospitalization (including imaging, microbiology, ancillary and laboratory) are listed below for reference.     Microbiology: Recent Results (from the past 240 hour(s))  Surgical pcr screen     Status: None   Collection Time: 09/10/20  1:22 PM   Specimen: Nasal Mucosa; Nasal Swab  Result Value Ref Range Status   MRSA, PCR NEGATIVE NEGATIVE Final   Staphylococcus aureus NEGATIVE NEGATIVE Final    Comment: (NOTE) The Xpert SA Assay (FDA approved for NASAL specimens in patients 83 years of age and older), is one component of a comprehensive surveillance program. It is not intended to diagnose infection nor to guide or monitor treatment. Performed at [facility], [address], [place], [place]      Labs: BNP (last 3 results) No results for input(s): BNP in the last 8760 hours. Basic Metabolic Panel: Recent Labs  Lab 09/17/20 1402 09/18/20 0235  NA 135 135  K 3.5 4.0  CL 99 99  CO2 24 24  GLUCOSE 224* 308*  BUN 25* 22  CREATININE 1.16* 1.20*  CALCIUM 9.2 8.8*   Liver Function Tests: Recent Labs  Lab 09/17/20 1402  AST 36  ALT 34  ALKPHOS 113  BILITOT 0.4  PROT 7.8  ALBUMIN 3.4*   No results for input(s): LIPASE, AMYLASE in the last 168  hours. No results for input(s): AMMONIA in the last 168 hours. CBC: Recent Labs  Lab 09/17/20 1402 09/18/20 0235  WBC 16.2* 14.4*  HGB 11.7* 11.0*  HCT 37.4 35.5*  MCV 88.0 87.2  PLT 285 302   Cardiac Enzymes: No results for input(s): CKTOTAL, CKMB, CKMBINDEX, TROPONINI in the last 168 hours. BNP: Invalid input(s): POCBNP CBG: Recent Labs  Lab 09/18/20 1103 09/18/20 1708 09/18/20 2119 09/19/20 11/19/20  09/19/20 1119  GLUCAP 359* 246* 105* 232* 219*   D-Dimer No results for input(s): DDIMER in the last 72 hours. Hgb A1c Recent Labs    09/17/20 1450  HGBA1C 8.5*   Lipid Profile No results for input(s): CHOL, HDL, LDLCALC, TRIG, CHOLHDL, LDLDIRECT in the last 72 hours. Thyroid function studies No results for input(s): TSH, T4TOTAL, T3FREE, THYROIDAB in the last 72 hours.  Invalid input(s): FREET3 Anemia work up No results for input(s): VITAMINB12, FOLATE, FERRITIN, TIBC, IRON, RETICCTPCT in the last 72 hours. Urinalysis No results found for: COLORURINE, APPEARANCEUR, LABSPEC, PHURINE, GLUCOSEU, HGBUR, BILIRUBINUR, KETONESUR, PROTEINUR, UROBILINOGEN, NITRITE, LEUKOCYTESUR Sepsis Labs Invalid input(s): PROCALCITONIN,  WBC,  LACTICIDVEN Microbiology Recent Results (from the past 240 hour(s))  Surgical pcr screen     Status: None   Collection Time: 09/10/20  1:22 PM   Specimen: Nasal Mucosa; Nasal Swab  Result Value Ref Range Status   MRSA, PCR NEGATIVE NEGATIVE Final   Staphylococcus aureus NEGATIVE NEGATIVE Final    Comment: (NOTE) The Xpert SA Assay (FDA approved for NASAL specimens in patients 68 years of age and older), is one component of a comprehensive surveillance program. It is not intended to diagnose infection nor to guide or monitor treatment. Performed at [facility], [address] [place], [place] [id number]      Time coordinating discharge: 33 minutes.  SIGNED:   Kathlen ModyVijaya Carlye Panameno, MD  [facility] 09/19/2020, 2:15 PM

## 2020-09-19 NOTE — Progress Notes (Signed)
B.P. 189/89 ,pulse-86 Dr. Leafy Half text page about High B.P. Judeth Cornfield R.N. aware.

## 2020-09-25 DIAGNOSIS — M19011 Primary osteoarthritis, right shoulder: Secondary | ICD-10-CM | POA: Diagnosis not present

## 2020-09-25 DIAGNOSIS — M25611 Stiffness of right shoulder, not elsewhere classified: Secondary | ICD-10-CM | POA: Diagnosis not present

## 2020-09-25 DIAGNOSIS — M6281 Muscle weakness (generalized): Secondary | ICD-10-CM | POA: Diagnosis not present

## 2020-09-29 DIAGNOSIS — I10 Essential (primary) hypertension: Secondary | ICD-10-CM | POA: Diagnosis not present

## 2020-09-29 DIAGNOSIS — Z09 Encounter for follow-up examination after completed treatment for conditions other than malignant neoplasm: Secondary | ICD-10-CM | POA: Diagnosis not present

## 2020-09-29 DIAGNOSIS — E1165 Type 2 diabetes mellitus with hyperglycemia: Secondary | ICD-10-CM | POA: Diagnosis not present

## 2020-09-29 DIAGNOSIS — Z299 Encounter for prophylactic measures, unspecified: Secondary | ICD-10-CM | POA: Diagnosis not present

## 2020-09-29 DIAGNOSIS — Z96619 Presence of unspecified artificial shoulder joint: Secondary | ICD-10-CM | POA: Diagnosis not present

## 2020-09-30 ENCOUNTER — Encounter (HOSPITAL_COMMUNITY): Payer: Self-pay | Admitting: Physical Therapy

## 2020-09-30 ENCOUNTER — Other Ambulatory Visit: Payer: Self-pay

## 2020-09-30 ENCOUNTER — Ambulatory Visit (HOSPITAL_COMMUNITY): Payer: Medicare Other | Attending: Orthopaedic Surgery | Admitting: Physical Therapy

## 2020-09-30 DIAGNOSIS — M25611 Stiffness of right shoulder, not elsewhere classified: Secondary | ICD-10-CM | POA: Insufficient documentation

## 2020-09-30 DIAGNOSIS — R29898 Other symptoms and signs involving the musculoskeletal system: Secondary | ICD-10-CM | POA: Diagnosis not present

## 2020-09-30 DIAGNOSIS — M25511 Pain in right shoulder: Secondary | ICD-10-CM | POA: Diagnosis not present

## 2020-09-30 DIAGNOSIS — M6281 Muscle weakness (generalized): Secondary | ICD-10-CM | POA: Diagnosis not present

## 2020-09-30 NOTE — Therapy (Signed)
[facility] [address] [place], [place], [address] Phone: [phone]   Fax:  [phone]  Physical Therapy Evaluation  Patient Details  Name: Sheila [name] MRN: [id number] Date of Birth: 1951/04/18 Referring Provider (PT): Ramond Marrow MD   Encounter Date: 09/30/2020   PT End of Session - 09/30/20 1528     Visit Number 1    Number of Visits 24    Date for PT Re-Evaluation 12/23/20    Authorization Type Primary: Medicare Secondary BCBS supplement (no auth)    Progress Note Due on Visit 10    PT Start Time 1449    PT Stop Time 1526    PT Time Calculation (min) 37 min    Equipment Utilized During Treatment --   sling   Activity Tolerance Patient tolerated treatment well    Behavior During Therapy WFL for tasks assessed/performed             Past Medical History:  Diagnosis Date   DM (diabetes mellitus) (HCC)    GERD (gastroesophageal reflux disease)    Gout    Hyperlipidemia    Hypertension     Past Surgical History:  Procedure Laterality Date   REVERSE SHOULDER ARTHROPLASTY Right 09/17/2020   Procedure: REVERSE SHOULDER ARTHROPLASTY;  Surgeon: Bjorn Pippin, MD;  Location: [facility];  Service: Orthopedics;  Laterality: Right;    There were no vitals filed for this visit.    Subjective Assessment - 09/30/20 1448     Subjective Patient is a 70 y.o. female who presents to physical therapy s/p R reverse total shoulder arthroplasty on 09/17/20. Patient states she has been doing pretty good. She didn't have to take pain meds during day yesterday but did at night. She has only been wiggling her fingers. She didn't have good use of her RUE before surgery. Her main goal is to get her arm moving again.    Limitations Lifting;House hold activities    Patient Stated Goals Improve arm mobility    Currently in Pain? No/denies                [facility] PT Assessment - 09/30/20 0001       Assessment   Medical  Diagnosis R reverse total shoulder arthoplasty    Referring Provider (PT) Ramond Marrow MD    Onset Date/Surgical Date 09/17/20    Next MD Visit 10/16/20      Precautions   Precautions Shoulder    Type of Shoulder Precautions Reverse total shoulder      Restrictions   Weight Bearing Restrictions Yes    RUE Weight Bearing Non weight bearing      Balance Screen   Has the patient fallen in the past 6 months No    Has the patient had a decrease in activity level because of a fear of falling?  No    Is the patient reluctant to leave their home because of a fear of falling?  No      Prior Function   Level of Independence Independent with basic ADLs    Vocation Retired      IT consultant   Overall Cognitive Status Within Functional Limits for tasks assessed      Observation/Other Assessments   Observations R UE in sling, dressing with dry blood, steri strips intact, incision appears to be healing well at this time    Focus on Therapeutic Outcomes (FOTO)  4% limited      ROM / Strength  AROM / PROM / Strength AROM;PROM;Strength      AROM   Overall AROM Comments L shoulder decreased ROM; not tested on RUE secondary to post surgical status      PROM   PROM Assessment Site Shoulder    Right/Left Shoulder Right    Right Shoulder Flexion 80 Degrees    Right Shoulder ABduction 70 Degrees    Right Shoulder External Rotation 13 Degrees      Strength   Overall Strength Comments not tested on RUE secondary to post surgical status                        Objective measurements completed on examination: See above findings.       [facility] Adult PT Treatment/Exercise - 09/30/20 0001       Exercises   Exercises Shoulder      Shoulder Exercises: Seated   Other Seated Exercises elbow flexion extension x 20 RUE, wrist flexion/extension x 20 RUE, gripping towel x 20; 2 sets of each                    PT Education - 09/30/20 1448     Education Details Patient  educated on exam findings, POC, scope of HEP, protocol, HEP    Person(s) Educated Patient    Methods Explanation;Demonstration    Comprehension Verbalized understanding;Returned demonstration              PT Short Term Goals - 09/30/20 1530       PT SHORT TERM GOAL #1   Title Patient will be independent with HEP in order to improve functional outcomes.    Time 2    Period Weeks    Status New    Target Date 10/14/20      PT SHORT TERM GOAL #2   Title Patient will demonstrate R shoulder ROM 90 degrees flexion, 15 ER to show improving ROM per protocol.    Time 2    Period Weeks    Status New    Target Date 10/14/20      PT SHORT TERM GOAL #3   Title Patient will demonstrate R shoulder ROM 120 degrees flexion, 30 ER to show improving ROM per protocol.    Time 4    Period Weeks    Status New    Target Date 10/28/20               PT Long Term Goals - 09/30/20 1530       PT LONG TERM GOAL #1   Title Patient will report at least 75% improvement in symptoms for improved quality of life.    Time 12    Period Weeks    Status New    Target Date 12/23/20      PT LONG TERM GOAL #2   Title Patient will improve FOTO score by at least 60 points in order to indicate improved tolerance to activity.    Time 12    Period Weeks    Status New    Target Date 12/23/20      PT LONG TERM GOAL #3   Title Patient will demonstrate at least 4+/5  R shoulder MMT (except extension and IR per protocol) in order to improve ability to lift overhead to a cabinet.    Time 12    Period Weeks    Status New    Target Date 12/23/20  Plan - 09/30/20 1623     Clinical Impression Statement Patient is a 70 y.o. female who presents to physical therapy s/p R reverse total shoulder arthroplasty on 09/17/20. She presents with pain limited deficits in L shoulder strength, ROM, and functional mobility with ADL. She is having to modify and restrict ADL as indicated by FOTO  score as well as subjective information and objective measures which is affecting overall participation. Patient will benefit from skilled physical therapy in order to improve function and reduce impairment.    Personal Factors and Comorbidities Comorbidity 3+;Fitness;Behavior Pattern;Past/Current Experience;Time since onset of injury/illness/exacerbation    Comorbidities Arthritis, BMI over 30, Diabetes, Osteoporosis    Examination-Activity Limitations Bathing;Bed Mobility;Bend;Dressing;Hygiene/Grooming;Lift;Reach Overhead;Sleep    Examination-Participation Restrictions Meal Prep;Cleaning;Community Activity;Driving;Laundry    Stability/Clinical Decision Making Stable/Uncomplicated    Clinical Decision Making Low    Rehab Potential Good    PT Frequency 2x / week    PT Duration 12 weeks    PT Treatment/Interventions ADLs/Self Care Home Management;Cryotherapy;Electrical Stimulation;Iontophoresis 4mg /ml Dexamethasone;Moist Heat;Traction;DME Instruction;Gait training;Stair training;Functional mobility training;Therapeutic activities;Therapeutic exercise;Balance training;Neuromuscular re-education;Patient/family education;Manual techniques;Manual lymph drainage;Compression bandaging;Scar mobilization;Passive range of motion;Dry needling;Energy conservation;Splinting;Taping;Vasopneumatic Device    PT Next Visit Plan Protocol in media and on PT desk and in book, begin PROM per protocol and progress to AAROM/ AROM as able, shoulder flex, abd, ER iso in neutral,    PT Home Exercise Plan 6/14 elbow flex/ext, wrist flex/ext, grip    Consulted and Agree with Plan of Care Patient             Patient will benefit from skilled therapeutic intervention in order to improve the following deficits and impairments:  Decreased range of motion, Decreased endurance, Increased muscle spasms, Impaired UE functional use, Pain, Decreased skin integrity, Decreased activity tolerance, Impaired flexibility, Improper body  mechanics, Decreased strength, Decreased mobility  Visit Diagnosis: Right shoulder pain, unspecified chronicity  Stiffness of right shoulder, not elsewhere classified  Muscle weakness (generalized)  Other symptoms and signs involving the musculoskeletal system     Problem List Patient Active Problem List   Diagnosis Date Noted   Postprocedural pneumothorax 09/17/2020   Diabetes mellitus type 2 in obese (HCC) 09/17/2020   GERD (gastroesophageal reflux disease) 09/17/2020   Leukocytosis 09/17/2020   S/P reverse total shoulder arthroplasty, right 09/17/2020   Acute respiratory failure with hypoxia (HCC) 09/17/2020   Gout attack 11/08/2018   Edema 10/24/2018   Ankle pain 06/06/2017   Upper airway cough syndrome 12/16/2015   Abnormal WBC count 02/26/2015   Arthritis 01/22/2015   History of gout 01/22/2015   Mild intermittent asthma without complication 01/22/2015    4:28 PM, 09/30/20 10/02/20 PT, DPT Physical Therapist at [facility]    [facility] [address] [place], [place], [place] Phone: [phone]   Fax:  [phone]  Name: Shanell Aden MRN: [id number] Date of Birth: [birth year]

## 2020-09-30 NOTE — Patient Instructions (Signed)
Access Code: [email or web address] URL: [email or web address] Date: 09/30/2020 Prepared by: Greig Castilla Dellis Voght  Exercises Seated Elbow Flexion and Extension AROM - 3 x daily - 7 x weekly - 2 sets - 20 reps Wrist AROM Flexion Extension - 2 x daily - 7 x weekly - 2 sets - 20 reps Seated Gripping Towel (Mirrored) - 2 x daily - 7 x weekly - 2 sets - 20 reps

## 2020-10-02 ENCOUNTER — Ambulatory Visit (HOSPITAL_COMMUNITY): Payer: Medicare Other | Admitting: Physical Therapy

## 2020-10-02 ENCOUNTER — Encounter (HOSPITAL_COMMUNITY): Payer: Self-pay | Admitting: Physical Therapy

## 2020-10-02 ENCOUNTER — Other Ambulatory Visit: Payer: Self-pay

## 2020-10-02 DIAGNOSIS — M25511 Pain in right shoulder: Secondary | ICD-10-CM | POA: Diagnosis not present

## 2020-10-02 DIAGNOSIS — M25611 Stiffness of right shoulder, not elsewhere classified: Secondary | ICD-10-CM | POA: Diagnosis not present

## 2020-10-02 DIAGNOSIS — R29898 Other symptoms and signs involving the musculoskeletal system: Secondary | ICD-10-CM | POA: Diagnosis not present

## 2020-10-02 DIAGNOSIS — M6281 Muscle weakness (generalized): Secondary | ICD-10-CM

## 2020-10-02 NOTE — Therapy (Signed)
[facility] [address] [place], [place], [address] Phone: [phone]   Fax:  [phone]  Physical Therapy Treatment  Patient Details  Name: Sheila [name] MRN: [id number] Date of Birth: [birth year] Referring Provider (PT): Ramond Marrow MD   Encounter Date: 10/02/2020   PT End of Session - 10/02/20 1614     Visit Number 2    Number of Visits 24    Date for PT Re-Evaluation 12/23/20    Authorization Type Primary: Medicare Secondary BCBS supplement (no auth)    Progress Note Due on Visit 10    PT Start Time 1300    PT Stop Time 1330    PT Time Calculation (min) 30 min    Equipment Utilized During Treatment Other (comment)   sling   Activity Tolerance Patient tolerated treatment well    Behavior During Therapy WFL for tasks assessed/performed             Past Medical History:  Diagnosis Date   DM (diabetes mellitus) (HCC)    GERD (gastroesophageal reflux disease)    Gout    Hyperlipidemia    Hypertension     Past Surgical History:  Procedure Laterality Date   REVERSE SHOULDER ARTHROPLASTY Right 09/17/2020   Procedure: REVERSE SHOULDER ARTHROPLASTY;  Surgeon: Bjorn Pippin, MD;  Location: [facility];  Service: Orthopedics;  Laterality: Right;    There were no vitals filed for this visit.   Subjective Assessment - 10/02/20 1610     Subjective Reports increased soreness after initial eval, but reports that she has been able to sleep better than before her surgery. Reports no pain today.    Limitations Lifting;House hold activities    Patient Stated Goals Improve arm mobility    Currently in Pain? No/denies    Pain Score 0-No pain                               [facility] Adult PT Treatment/Exercise - 10/02/20 0001       Shoulder Exercises: Seated   Other Seated Exercises elbow flexion extension x 20 RUE, wrist flexion/extension x 20 RUE, gripping towel x 20; 2 sets of each      Shoulder  Exercises: Standing   Other Standing Exercises isometric flexion 5"x10, increased difficulty with rotation and abduction, not completed at this time.      Shoulder Exercises: Therapy Ball   Flexion Limitations table slides 10"x5      Manual Therapy   Manual Therapy Joint mobilization;Soft tissue mobilization;Scapular mobilization;Manual Traction    Manual therapy comments independent of other activiites for ROM, tissue mobilization.    Joint Mobilization flexion, abduction, ER    Scapular Mobilization with ROM    Manual Traction loose packed with vibration.                    PT Education - 10/02/20 1614     Education Details HEP: table slides, flexion isometric.    Person(s) Educated Patient    Methods Explanation;Demonstration;Tactile cues    Comprehension Verbalized understanding              PT Short Term Goals - 10/02/20 1622       PT SHORT TERM GOAL #1   Title Patient will be independent with HEP in order to improve functional outcomes.    Time 2    Period Weeks    Status On-going  Target Date 10/14/20      PT SHORT TERM GOAL #2   Title Patient will demonstrate R shoulder ROM 90 degrees flexion, 15 ER to show improving ROM per protocol.    Time 2    Period Weeks    Status On-going    Target Date 10/14/20      PT SHORT TERM GOAL #3   Title Patient will demonstrate R shoulder ROM 120 degrees flexion, 30 ER to show improving ROM per protocol.    Time 4    Period Weeks    Status On-going    Target Date 10/28/20               PT Long Term Goals - 10/02/20 1622       PT LONG TERM GOAL #1   Title Patient will report at least 75% improvement in symptoms for improved quality of life.    Time 12    Period Weeks    Status On-going      PT LONG TERM GOAL #2   Title Patient will improve FOTO score by at least 60 points in order to indicate improved tolerance to activity.    Time 12    Period Weeks    Status On-going      PT LONG TERM GOAL  #3   Title Patient will demonstrate at least 4+/5  R shoulder MMT (except extension and IR per protocol) in order to improve ability to lift overhead to a cabinet.    Time 12    Period Weeks    Status On-going                   Plan - 10/02/20 1615     Clinical Impression Statement Demo increased difficulty with decreased activation in shoulder throughout session. Demo increase in pain when attempting shoulder isometric abduction and ER, not added to HEP at this time, and discussed with pt to listen to her shoulder/pain. She demos increased difficulty with AAROM both in shoulder slides and attempted shoulder flexion AAROM    Personal Factors and Comorbidities Comorbidity 3+;Fitness;Behavior Pattern;Past/Current Experience;Time since onset of injury/illness/exacerbation    Comorbidities Arthritis, BMI over 30, Diabetes, Osteoporosis    Examination-Activity Limitations Bathing;Bed Mobility;Bend;Dressing;Hygiene/Grooming;Lift;Reach Overhead;Sleep    Examination-Participation Restrictions Meal Prep;Cleaning;Community Activity;Driving;Laundry    Stability/Clinical Decision Making Stable/Uncomplicated    Rehab Potential Good    PT Frequency 2x / week    PT Duration 12 weeks    PT Treatment/Interventions ADLs/Self Care Home Management;Cryotherapy;Electrical Stimulation;Iontophoresis 4mg /ml Dexamethasone;Moist Heat;Traction;DME Instruction;Gait training;Stair training;Functional mobility training;Therapeutic activities;Therapeutic exercise;Balance training;Neuromuscular re-education;Patient/family education;Manual techniques;Manual lymph drainage;Compression bandaging;Scar mobilization;Passive range of motion;Dry needling;Energy conservation;Splinting;Taping;Vasopneumatic Device    PT Next Visit Plan Protocol in media and on PT desk and in book, begin PROM per protocol and progress to AAROM/ AROM as able, shoulder flex, abd, ER iso in neutral,    PT Home Exercise Plan 6/14 elbow flex/ext, wrist  flex/ext, grip 6/16: table slides, flexion isometric    Consulted and Agree with Plan of Care Patient             Patient will benefit from skilled therapeutic intervention in order to improve the following deficits and impairments:  Decreased range of motion, Decreased endurance, Increased muscle spasms, Impaired UE functional use, Pain, Decreased skin integrity, Decreased activity tolerance, Impaired flexibility, Improper body mechanics, Decreased strength, Decreased mobility  Visit Diagnosis: Right shoulder pain, unspecified chronicity  Stiffness of right shoulder, not elsewhere classified  Muscle weakness (generalized)  Other  symptoms and signs involving the musculoskeletal system     Problem List Patient Active Problem List   Diagnosis Date Noted   Postprocedural pneumothorax 09/17/2020   Diabetes mellitus type 2 in obese (HCC) 09/17/2020   GERD (gastroesophageal reflux disease) 09/17/2020   Leukocytosis 09/17/2020   S/P reverse total shoulder arthroplasty, right 09/17/2020   Acute respiratory failure with hypoxia (HCC) 09/17/2020   Gout attack 11/08/2018   Edema 10/24/2018   Ankle pain 06/06/2017   Upper airway cough syndrome 12/16/2015   Abnormal WBC count 02/26/2015   Arthritis 01/22/2015   History of gout 01/22/2015   Mild intermittent asthma without complication 01/22/2015    4:24 PM,10/02/20 Esmeralda Links, PT, DPT Physical Therapist at [facility] [address] [place], [place], [address] Phone: [phone]   Fax:  [phone]  Name: Zurii Hewes MRN: [id number] Date of Birth: 11-17-1950

## 2020-10-07 ENCOUNTER — Ambulatory Visit (HOSPITAL_COMMUNITY): Payer: Medicare Other

## 2020-10-07 ENCOUNTER — Encounter (HOSPITAL_COMMUNITY): Payer: Self-pay

## 2020-10-07 ENCOUNTER — Other Ambulatory Visit: Payer: Self-pay

## 2020-10-07 DIAGNOSIS — M6281 Muscle weakness (generalized): Secondary | ICD-10-CM | POA: Diagnosis not present

## 2020-10-07 DIAGNOSIS — R29898 Other symptoms and signs involving the musculoskeletal system: Secondary | ICD-10-CM

## 2020-10-07 DIAGNOSIS — M25511 Pain in right shoulder: Secondary | ICD-10-CM

## 2020-10-07 DIAGNOSIS — M25611 Stiffness of right shoulder, not elsewhere classified: Secondary | ICD-10-CM | POA: Diagnosis not present

## 2020-10-07 NOTE — Therapy (Signed)
[facility] [address] [place], [place], [address] Phone: [phone]   Fax:  [phone]  Physical Therapy Treatment  Patient Details  Name: Sheila [name] MRN: [id number] Date of Birth: Aug 22, 1950 Referring Provider (PT): Ramond Marrow MD   Encounter Date: 10/07/2020   PT End of Session - 10/07/20 1415     Visit Number 3    Number of Visits 24    Date for PT Re-Evaluation 12/23/20    Authorization Type Primary: Medicare Secondary BCBS supplement (no auth)    Progress Note Due on Visit 10    PT Start Time 1404    PT Stop Time 1442    PT Time Calculation (min) 38 min    Equipment Utilized During Treatment Other (comment)   sling   Activity Tolerance Patient tolerated treatment well    Behavior During Therapy WFL for tasks assessed/performed             Past Medical History:  Diagnosis Date   DM (diabetes mellitus) (HCC)    GERD (gastroesophageal reflux disease)    Gout    Hyperlipidemia    Hypertension     Past Surgical History:  Procedure Laterality Date   REVERSE SHOULDER ARTHROPLASTY Right 09/17/2020   Procedure: REVERSE SHOULDER ARTHROPLASTY;  Surgeon: Bjorn Pippin, MD;  Location: [facility];  Service: Orthopedics;  Laterality: Right;    There were no vitals filed for this visit.   Subjective Assessment - 10/07/20 1414     Subjective Stated she is feeling good today, no reports of pain.  Reports she was sore 2 days ago.  Reports she returned to bed on Sunday and had the best night sleep.    Patient Stated Goals Improve arm mobility    Currently in Pain? No/denies                               [facility] Adult PT Treatment/Exercise - 10/07/20 0001       Exercises   Exercises Shoulder      Shoulder Exercises: Supine   External Rotation PROM;AAROM;Right;10 reps   PROM 10" holds, AAROM 5" holds with PVC   External Rotation Limitations 15 degrees    Flexion PROM;10 reps;AAROM    AAROM PVC pipe   Flexion Limitations 87 degrees    ABduction PROM;10 reps      Shoulder Exercises: Seated   Other Seated Exercises elbow flexion extension x 20 RUE, wrist flexion/extension x 20 RUE                      PT Short Term Goals - 10/02/20 1622       PT SHORT TERM GOAL #1   Title Patient will be independent with HEP in order to improve functional outcomes.    Time 2    Period Weeks    Status On-going    Target Date 10/14/20      PT SHORT TERM GOAL #2   Title Patient will demonstrate R shoulder ROM 90 degrees flexion, 15 ER to show improving ROM per protocol.    Time 2    Period Weeks    Status On-going    Target Date 10/14/20      PT SHORT TERM GOAL #3   Title Patient will demonstrate R shoulder ROM 120 degrees flexion, 30 ER to show improving ROM per protocol.    Time 4  Period Weeks    Status On-going    Target Date 10/28/20               PT Long Term Goals - 10/02/20 1622       PT LONG TERM GOAL #1   Title Patient will report at least 75% improvement in symptoms for improved quality of life.    Time 12    Period Weeks    Status On-going      PT LONG TERM GOAL #2   Title Patient will improve FOTO score by at least 60 points in order to indicate improved tolerance to activity.    Time 12    Period Weeks    Status On-going      PT LONG TERM GOAL #3   Title Patient will demonstrate at least 4+/5  R shoulder MMT (except extension and IR per protocol) in order to improve ability to lift overhead to a cabinet.    Time 12    Period Weeks    Status On-going                   Plan - 10/07/20 1900     Clinical Impression Statement Pt 20 days post-op and tolerated well to sessoin.  Session focus per protocol with main focus on PROM then progressed to [facility].  No reports of pain through session, did comment on a stretch at end range with PROM flexion.    Personal Factors and Comorbidities Comorbidity 3+;Fitness;Behavior  Pattern;Past/Current Experience;Time since onset of injury/illness/exacerbation    Comorbidities Arthritis, BMI over 30, Diabetes, Osteoporosis    Examination-Activity Limitations Bathing;Bed Mobility;Bend;Dressing;Hygiene/Grooming;Lift;Reach Overhead;Sleep    Examination-Participation Restrictions Meal Prep;Cleaning;Community Activity;Driving;Laundry    Stability/Clinical Decision Making Stable/Uncomplicated    Clinical Decision Making Low    Rehab Potential Good    PT Frequency 2x / week    PT Duration 12 weeks    PT Treatment/Interventions ADLs/Self Care Home Management;Cryotherapy;Electrical Stimulation;Iontophoresis 4mg /ml Dexamethasone;Moist Heat;Traction;DME Instruction;Gait training;Stair training;Functional mobility training;Therapeutic activities;Therapeutic exercise;Balance training;Neuromuscular re-education;Patient/family education;Manual techniques;Manual lymph drainage;Compression bandaging;Scar mobilization;Passive range of motion;Dry needling;Energy conservation;Splinting;Taping;Vasopneumatic Device    PT Next Visit Plan Protocol in media and on PT desk and in book, begin PROM per protocol and progress to AAROM/ AROM as able, shoulder flex, abd, ER iso in neutral,    PT Home Exercise Plan 6/14 elbow flex/ext, wrist flex/ext, grip 6/16: table slides, flexion isometric    Consulted and Agree with Plan of Care Patient             Patient will benefit from skilled therapeutic intervention in order to improve the following deficits and impairments:  Decreased range of motion, Decreased endurance, Increased muscle spasms, Impaired UE functional use, Pain, Decreased skin integrity, Decreased activity tolerance, Impaired flexibility, Improper body mechanics, Decreased strength, Decreased mobility  Visit Diagnosis: Right shoulder pain, unspecified chronicity  Stiffness of right shoulder, not elsewhere classified  Muscle weakness (generalized)  Other symptoms and signs involving  the musculoskeletal system     Problem List Patient Active Problem List   Diagnosis Date Noted   Postprocedural pneumothorax 09/17/2020   Diabetes mellitus type 2 in obese (HCC) 09/17/2020   GERD (gastroesophageal reflux disease) 09/17/2020   Leukocytosis 09/17/2020   S/P reverse total shoulder arthroplasty, right 09/17/2020   Acute respiratory failure with hypoxia (HCC) 09/17/2020   Gout attack 11/08/2018   Edema 10/24/2018   Ankle pain 06/06/2017   Upper airway cough syndrome 12/16/2015   Abnormal WBC count 02/26/2015   Arthritis  01/22/2015   History of gout 01/22/2015   Mild intermittent asthma without complication 01/22/2015   Becky Sax, LPTA/CLT; CBIS [phone]  Juel Burrow 10/07/2020, 7:03 PM  [facility] [address] [place], [place], [address] Phone: [phone]   Fax:  [phone]  Name: Sheila [name] MRN: [id number] Date of Birth: [birth year]

## 2020-10-10 ENCOUNTER — Ambulatory Visit (HOSPITAL_COMMUNITY): Payer: Medicare Other

## 2020-10-10 ENCOUNTER — Encounter (HOSPITAL_COMMUNITY): Payer: Self-pay

## 2020-10-10 ENCOUNTER — Other Ambulatory Visit: Payer: Self-pay

## 2020-10-10 DIAGNOSIS — R29898 Other symptoms and signs involving the musculoskeletal system: Secondary | ICD-10-CM

## 2020-10-10 DIAGNOSIS — M25611 Stiffness of right shoulder, not elsewhere classified: Secondary | ICD-10-CM

## 2020-10-10 DIAGNOSIS — M25511 Pain in right shoulder: Secondary | ICD-10-CM

## 2020-10-10 DIAGNOSIS — M6281 Muscle weakness (generalized): Secondary | ICD-10-CM | POA: Diagnosis not present

## 2020-10-10 NOTE — Therapy (Signed)
[facility] [address] [place], [place], [address] Phone: [phone]   Fax:  [phone]  Physical Therapy Treatment  Patient Details  Name: Sheila [name] MRN: [id number] Date of Birth: 26-Jun-1950 Referring Provider (PT): Ramond Marrow MD   Encounter Date: 10/10/2020   PT End of Session - 10/10/20 1258     Visit Number 4    Number of Visits 24    Date for PT Re-Evaluation 12/23/20    Authorization Type Primary: Medicare Secondary BCBS supplement (no auth)    Progress Note Due on Visit 10    PT Start Time 1300    PT Stop Time 1345    PT Time Calculation (min) 45 min    Equipment Utilized During Treatment Other (comment)   sling   Activity Tolerance Patient tolerated treatment well    Behavior During Therapy WFL for tasks assessed/performed             Past Medical History:  Diagnosis Date   DM (diabetes mellitus) (HCC)    GERD (gastroesophageal reflux disease)    Gout    Hyperlipidemia    Hypertension     Past Surgical History:  Procedure Laterality Date   REVERSE SHOULDER ARTHROPLASTY Right 09/17/2020   Procedure: REVERSE SHOULDER ARTHROPLASTY;  Surgeon: Bjorn Pippin, MD;  Location:  SURGERY CENTER;  Service: Orthopedics;  Laterality: Right;    There were no vitals filed for this visit.   Subjective Assessment - 10/10/20 1304     Subjective Feeling better, no pain and sleeping well at night    Patient Stated Goals Improve arm mobility    Currently in Pain? No/denies    Pain Score 0-No pain    Pain Location Shoulder    Pain Orientation Right                [facility] PT Assessment - 10/10/20 0001       Assessment   Medical Diagnosis R reverse total shoulder arthoplasty    Referring Provider (PT) Ramond Marrow MD    Onset Date/Surgical Date 09/17/20    Next MD Visit 10/16/20      Precautions   Precautions Shoulder    Type of Shoulder Precautions Reverse total shoulder                            [facility] Adult PT Treatment/Exercise - 10/10/20 0001       Shoulder Exercises: Supine   External Rotation AAROM;Right;20 reps    Flexion AAROM;Both;20 reps    ABduction AAROM;Right;20 reps      Shoulder Exercises: Seated   Retraction Strengthening;20 reps    Other Seated Exercises elbow flexion extension x 20 RUE, wrist flexion/extension x 20 RUE, pronation/supination x 20 RUE      Shoulder Exercises: Standing   Other Standing Exercises submax isometric flexion, abduction 15x2" hold    Other Standing Exercises pendulums      Shoulder Exercises: Therapy Ball   Flexion Limitations table slides 10"x5      Manual Therapy   Manual Therapy Joint mobilization;Scapular mobilization;Passive ROM    Manual therapy comments independent of other activiites for ROM, tissue mobilization.    Joint Mobilization flexion, abduction, ER 20x 5 sec end-range hold                      PT Short Term Goals - 10/02/20 1622       PT  SHORT TERM GOAL #1   Title Patient will be independent with HEP in order to improve functional outcomes.    Time 2    Period Weeks    Status On-going    Target Date 10/14/20      PT SHORT TERM GOAL #2   Title Patient will demonstrate R shoulder ROM 90 degrees flexion, 15 ER to show improving ROM per protocol.    Time 2    Period Weeks    Status On-going    Target Date 10/14/20      PT SHORT TERM GOAL #3   Title Patient will demonstrate R shoulder ROM 120 degrees flexion, 30 ER to show improving ROM per protocol.    Time 4    Period Weeks    Status On-going    Target Date 10/28/20               PT Long Term Goals - 10/02/20 1622       PT LONG TERM GOAL #1   Title Patient will report at least 75% improvement in symptoms for improved quality of life.    Time 12    Period Weeks    Status On-going      PT LONG TERM GOAL #2   Title Patient will improve FOTO score by at least 60 points in order to indicate improved  tolerance to activity.    Time 12    Period Weeks    Status On-going      PT LONG TERM GOAL #3   Title Patient will demonstrate at least 4+/5  R shoulder MMT (except extension and IR per protocol) in order to improve ability to lift overhead to a cabinet.    Time 12    Period Weeks    Status On-going                   Plan - 10/10/20 1340     Clinical Impression Statement Tolerating tx sessions well and maintaining compliance with post-op precautions.  Difficulty with coordination when performing AAROM/PROM for external rotation requiring frequent tactile cues/guidance for proper form.  Good tolerance to manual PROM without pain and joint freely moves within availble range in scapular plane.  Continued tx indicated to progress rehab protcol along to stage 2 following MD visit/approval.    Personal Factors and Comorbidities Comorbidity 3+;Fitness;Behavior Pattern;Past/Current Experience;Time since onset of injury/illness/exacerbation    Comorbidities Arthritis, BMI over 30, Diabetes, Osteoporosis    Examination-Activity Limitations Bathing;Bed Mobility;Bend;Dressing;Hygiene/Grooming;Lift;Reach Overhead;Sleep    Examination-Participation Restrictions Meal Prep;Cleaning;Community Activity;Driving;Laundry    Stability/Clinical Decision Making Stable/Uncomplicated    Rehab Potential Good    PT Frequency 2x / week    PT Duration 12 weeks    PT Treatment/Interventions ADLs/Self Care Home Management;Cryotherapy;Electrical Stimulation;Iontophoresis 4mg /ml Dexamethasone;Moist Heat;Traction;DME Instruction;Gait training;Stair training;Functional mobility training;Therapeutic activities;Therapeutic exercise;Balance training;Neuromuscular re-education;Patient/family education;Manual techniques;Manual lymph drainage;Compression bandaging;Scar mobilization;Passive range of motion;Dry needling;Energy conservation;Splinting;Taping;Vasopneumatic Device    PT Next Visit Plan Protocol in media and on  PT desk and in book, begin PROM per protocol and progress to AAROM/ AROM as able, shoulder flex, abd, ER iso in neutral,    PT Home Exercise Plan 6/14 elbow flex/ext, wrist flex/ext, grip 6/16: table slides, flexion isometric    Consulted and Agree with Plan of Care Patient             Patient will benefit from skilled therapeutic intervention in order to improve the following deficits and impairments:  Decreased range of motion, Decreased endurance,  Increased muscle spasms, Impaired UE functional use, Pain, Decreased skin integrity, Decreased activity tolerance, Impaired flexibility, Improper body mechanics, Decreased strength, Decreased mobility  Visit Diagnosis: Right shoulder pain, unspecified chronicity  Stiffness of right shoulder, not elsewhere classified  Muscle weakness (generalized)  Other symptoms and signs involving the musculoskeletal system     Problem List Patient Active Problem List   Diagnosis Date Noted   Postprocedural pneumothorax 09/17/2020   Diabetes mellitus type 2 in obese (HCC) 09/17/2020   GERD (gastroesophageal reflux disease) 09/17/2020   Leukocytosis 09/17/2020   S/P reverse total shoulder arthroplasty, right 09/17/2020   Acute respiratory failure with hypoxia (HCC) 09/17/2020   Gout attack 11/08/2018   Edema 10/24/2018   Ankle pain 06/06/2017   Upper airway cough syndrome 12/16/2015   Abnormal WBC count 02/26/2015   Arthritis 01/22/2015   History of gout 01/22/2015   Mild intermittent asthma without complication 01/22/2015   1:43 PM, 10/10/20 M. Shary Decamp, PT, DPT Physical Therapist- [place] Office Number: [phone]   [facility] [address] [place], [place], [address] Phone: [phone]   Fax:  [phone]  Name: Sheila [name] MRN: [id number] Date of Birth: [birth year]

## 2020-10-14 ENCOUNTER — Other Ambulatory Visit: Payer: Self-pay

## 2020-10-14 ENCOUNTER — Ambulatory Visit (HOSPITAL_COMMUNITY): Payer: Medicare Other

## 2020-10-14 DIAGNOSIS — M25511 Pain in right shoulder: Secondary | ICD-10-CM

## 2020-10-14 DIAGNOSIS — M6281 Muscle weakness (generalized): Secondary | ICD-10-CM | POA: Diagnosis not present

## 2020-10-14 DIAGNOSIS — R29898 Other symptoms and signs involving the musculoskeletal system: Secondary | ICD-10-CM | POA: Diagnosis not present

## 2020-10-14 DIAGNOSIS — M25611 Stiffness of right shoulder, not elsewhere classified: Secondary | ICD-10-CM

## 2020-10-14 NOTE — Therapy (Signed)
[facility] [address] [place], [place], [address] Phone: [phone]   Fax:  [phone]  Physical Therapy Treatment  Patient Details  Name: Sheila [name] MRN: [id number] Date of Birth: [birth year] Referring Provider (PT): Ramond Marrow MD   Encounter Date: 10/14/2020   PT End of Session - 10/14/20 1430     Visit Number 5    Number of Visits 24    Date for PT Re-Evaluation 12/23/20    Authorization Type Primary: Medicare Secondary BCBS supplement (no auth)    Progress Note Due on Visit 10    PT Start Time 1345    PT Stop Time 1430    PT Time Calculation (min) 45 min    Equipment Utilized During Treatment Other (comment)   sling   Activity Tolerance Patient tolerated treatment well    Behavior During Therapy WFL for tasks assessed/performed             Past Medical History:  Diagnosis Date   DM (diabetes mellitus) (HCC)    GERD (gastroesophageal reflux disease)    Gout    Hyperlipidemia    Hypertension     Past Surgical History:  Procedure Laterality Date   REVERSE SHOULDER ARTHROPLASTY Right 09/17/2020   Procedure: REVERSE SHOULDER ARTHROPLASTY;  Surgeon: Bjorn Pippin, MD;  Location: [facility];  Service: Orthopedics;  Laterality: Right;    There were no vitals filed for this visit.   Subjective Assessment - 10/14/20 1430     Subjective Doing ROM exercises at home 3x/day    Patient Stated Goals Improve arm mobility                [facility] PT Assessment - 10/14/20 0001       Assessment   Medical Diagnosis R reverse total shoulder arthoplasty    Referring Provider (PT) Ramond Marrow MD    Onset Date/Surgical Date 09/17/20    Next MD Visit 10/16/20      PROM   Right Shoulder Flexion 100 Degrees    Right Shoulder ABduction 80 Degrees    Right Shoulder External Rotation 25 Degrees                           [facility] Adult PT Treatment/Exercise - 10/14/20 0001       Shoulder  Exercises: Supine   External Rotation AAROM;Right;20 reps    Flexion AAROM;Right;20 reps    ABduction AAROM;20 reps      Manual Therapy   Manual Therapy Passive ROM    Manual therapy comments independent of other activiites for ROM, tissue mobilization.    Joint Mobilization flexion, abduction, ER 30 reps x 5 sec end-range hold                      PT Short Term Goals - 10/02/20 1622       PT SHORT TERM GOAL #1   Title Patient will be independent with HEP in order to improve functional outcomes.    Time 2    Period Weeks    Status On-going    Target Date 10/14/20      PT SHORT TERM GOAL #2   Title Patient will demonstrate R shoulder ROM 90 degrees flexion, 15 ER to show improving ROM per protocol.    Time 2    Period Weeks    Status On-going    Target Date 10/14/20  PT SHORT TERM GOAL #3   Title Patient will demonstrate R shoulder ROM 120 degrees flexion, 30 ER to show improving ROM per protocol.    Time 4    Period Weeks    Status On-going    Target Date 10/28/20               PT Long Term Goals - 10/02/20 1622       PT LONG TERM GOAL #1   Title Patient will report at least 75% improvement in symptoms for improved quality of life.    Time 12    Period Weeks    Status On-going      PT LONG TERM GOAL #2   Title Patient will improve FOTO score by at least 60 points in order to indicate improved tolerance to activity.    Time 12    Period Weeks    Status On-going      PT LONG TERM GOAL #3   Title Patient will demonstrate at least 4+/5  R shoulder MMT (except extension and IR per protocol) in order to improve ability to lift overhead to a cabinet.    Time 12    Period Weeks    Status On-going                   Plan - 10/14/20 1431     Clinical Impression Statement Improved right shoulder PROM available today. No complaints or issues noted.  Pt has ortho MD f/u visit this Thursday. Continued POC indicated to progress ROM and  prepare for phase 2 rehab protocol    Personal Factors and Comorbidities Comorbidity 3+;Fitness;Behavior Pattern;Past/Current Experience;Time since onset of injury/illness/exacerbation    Comorbidities Arthritis, BMI over 30, Diabetes, Osteoporosis    Examination-Activity Limitations Bathing;Bed Mobility;Bend;Dressing;Hygiene/Grooming;Lift;Reach Overhead;Sleep    Examination-Participation Restrictions Meal Prep;Cleaning;Community Activity;Driving;Laundry    Stability/Clinical Decision Making Stable/Uncomplicated    Rehab Potential Good    PT Frequency 2x / week    PT Duration 12 weeks    PT Treatment/Interventions ADLs/Self Care Home Management;Cryotherapy;Electrical Stimulation;Iontophoresis 4mg /ml Dexamethasone;Moist Heat;Traction;DME Instruction;Gait training;Stair training;Functional mobility training;Therapeutic activities;Therapeutic exercise;Balance training;Neuromuscular re-education;Patient/family education;Manual techniques;Manual lymph drainage;Compression bandaging;Scar mobilization;Passive range of motion;Dry needling;Energy conservation;Splinting;Taping;Vasopneumatic Device    PT Next Visit Plan Protocol in media and on PT desk and in book, begin PROM per protocol and progress to AAROM/ AROM as able, shoulder flex, abd, ER iso in neutral,    PT Home Exercise Plan 6/14 elbow flex/ext, wrist flex/ext, grip 6/16: table slides, flexion isometric    Consulted and Agree with Plan of Care Patient             Patient will benefit from skilled therapeutic intervention in order to improve the following deficits and impairments:  Decreased range of motion, Decreased endurance, Increased muscle spasms, Impaired UE functional use, Pain, Decreased skin integrity, Decreased activity tolerance, Impaired flexibility, Improper body mechanics, Decreased strength, Decreased mobility  Visit Diagnosis: Right shoulder pain, unspecified chronicity  Stiffness of right shoulder, not elsewhere  classified  Muscle weakness (generalized)  Other symptoms and signs involving the musculoskeletal system     Problem List Patient Active Problem List   Diagnosis Date Noted   Postprocedural pneumothorax 09/17/2020   Diabetes mellitus type 2 in obese (HCC) 09/17/2020   GERD (gastroesophageal reflux disease) 09/17/2020   Leukocytosis 09/17/2020   S/P reverse total shoulder arthroplasty, right 09/17/2020   Acute respiratory failure with hypoxia (HCC) 09/17/2020   Gout attack 11/08/2018   Edema 10/24/2018   Ankle  pain 06/06/2017   Upper airway cough syndrome 12/16/2015   Abnormal WBC count 02/26/2015   Arthritis 01/22/2015   History of gout 01/22/2015   Mild intermittent asthma without complication 01/22/2015   2:32 PM, 10/14/20 M. Shary Decamp, PT, DPT Physical Therapist- [place] Office Number: [phone]   [facility] [address] [place], [place], [address] Phone: [phone]   Fax:  [phone]  Name: Gerri Acre MRN: [id number] Date of Birth: 11/21/1950

## 2020-10-15 ENCOUNTER — Ambulatory Visit (HOSPITAL_COMMUNITY): Payer: Medicare Other | Admitting: Physical Therapy

## 2020-10-15 ENCOUNTER — Encounter (HOSPITAL_COMMUNITY): Payer: Self-pay | Admitting: Physical Therapy

## 2020-10-15 DIAGNOSIS — M25611 Stiffness of right shoulder, not elsewhere classified: Secondary | ICD-10-CM | POA: Diagnosis not present

## 2020-10-15 DIAGNOSIS — M6281 Muscle weakness (generalized): Secondary | ICD-10-CM | POA: Diagnosis not present

## 2020-10-15 DIAGNOSIS — R29898 Other symptoms and signs involving the musculoskeletal system: Secondary | ICD-10-CM

## 2020-10-15 DIAGNOSIS — M25511 Pain in right shoulder: Secondary | ICD-10-CM | POA: Diagnosis not present

## 2020-10-15 NOTE — Therapy (Signed)
[facility] [address] [place], [place], [address] Phone: [phone]   Fax:  [phone]  Physical Therapy Treatment  Patient Details  Name: Sheila [name] MRN: [id number] Date of Birth: 1950-09-05 Referring Provider (PT): Ramond Marrow MD   Encounter Date: 10/15/2020   PT End of Session - 10/15/20 1650     Visit Number 6    Number of Visits 24    Date for PT Re-Evaluation 12/23/20    Authorization Type Primary: Medicare Secondary BCBS supplement (no auth)    Progress Note Due on Visit 10    PT Start Time 1647    PT Stop Time 1726    PT Time Calculation (min) 39 min    Equipment Utilized During Treatment Other (comment)   sling   Activity Tolerance Patient tolerated treatment well    Behavior During Therapy WFL for tasks assessed/performed             Past Medical History:  Diagnosis Date   DM (diabetes mellitus) (HCC)    GERD (gastroesophageal reflux disease)    Gout    Hyperlipidemia    Hypertension     Past Surgical History:  Procedure Laterality Date   REVERSE SHOULDER ARTHROPLASTY Right 09/17/2020   Procedure: REVERSE SHOULDER ARTHROPLASTY;  Surgeon: Bjorn Pippin, MD;  Location: [facility];  Service: Orthopedics;  Laterality: Right;    There were no vitals filed for this visit.   Subjective Assessment - 10/15/20 1650     Subjective Everything is going well. No pain. Doing exercise daily without issues.    Patient Stated Goals Improve arm mobility    Currently in Pain? No/denies                [facility] PT Assessment - 10/15/20 0001       Assessment   Medical Diagnosis R reverse total shoulder arthoplasty    Referring Provider (PT) Ramond Marrow MD    Onset Date/Surgical Date 09/17/20    Next MD Visit 10/16/20      PROM   Right Shoulder Flexion 110 Degrees    Right Shoulder ABduction 90 Degrees    Right Shoulder External Rotation 25 Degrees                           [facility]  Adult PT Treatment/Exercise - 10/15/20 0001       Shoulder Exercises: Supine   External Rotation AAROM;Right;15 reps    Flexion AAROM;Right;15 reps    ABduction AAROM;15 reps      Shoulder Exercises: Seated   Retraction 15 reps      Shoulder Exercises: Standing   Other Standing Exercises elbow flexion x20    Other Standing Exercises pendulums x20 CW/CCW, shoulde flexion/ abduction sub maximal isometric 10 x 5" each      Manual Therapy   Manual Therapy Passive ROM    Manual therapy comments Completed separate from all other activity    Passive ROM Rt GHJ PROM (flex/ abduction, ER) per protocol                      PT Short Term Goals - 10/02/20 1622       PT SHORT TERM GOAL #1   Title Patient will be independent with HEP in order to improve functional outcomes.    Time 2    Period Weeks    Status On-going    Target Date  10/14/20      PT SHORT TERM GOAL #2   Title Patient will demonstrate R shoulder ROM 90 degrees flexion, 15 ER to show improving ROM per protocol.    Time 2    Period Weeks    Status On-going    Target Date 10/14/20      PT SHORT TERM GOAL #3   Title Patient will demonstrate R shoulder ROM 120 degrees flexion, 30 ER to show improving ROM per protocol.    Time 4    Period Weeks    Status On-going    Target Date 10/28/20               PT Long Term Goals - 10/02/20 1622       PT LONG TERM GOAL #1   Title Patient will report at least 75% improvement in symptoms for improved quality of life.    Time 12    Period Weeks    Status On-going      PT LONG TERM GOAL #2   Title Patient will improve FOTO score by at least 60 points in order to indicate improved tolerance to activity.    Time 12    Period Weeks    Status On-going      PT LONG TERM GOAL #3   Title Patient will demonstrate at least 4+/5  R shoulder MMT (except extension and IR per protocol) in order to improve ability to lift overhead to a cabinet.    Time 12    Period  Weeks    Status On-going                   Plan - 10/15/20 1728     Clinical Impression Statement Patient progressing well per protocol. ROM coming along well, movement pain free. Patient requires frequent verbal cues for proper form with shoulder ER and isometric abduction. Reiterated importance of proper from to abide by protocol and avoid undue tissue stress. Patient will continue to benefit from skilled therapy services to progress shoulder strength and mobility per surgical protocol for improved functional abilities with ADLs.    Personal Factors and Comorbidities Comorbidity 3+;Fitness;Behavior Pattern;Past/Current Experience;Time since onset of injury/illness/exacerbation    Comorbidities Arthritis, BMI over 30, Diabetes, Osteoporosis    Examination-Activity Limitations Bathing;Bed Mobility;Bend;Dressing;Hygiene/Grooming;Lift;Reach Overhead;Sleep    Examination-Participation Restrictions Meal Prep;Cleaning;Community Activity;Driving;Laundry    Stability/Clinical Decision Making Stable/Uncomplicated    Rehab Potential Good    PT Frequency 2x / week    PT Duration 12 weeks    PT Treatment/Interventions ADLs/Self Care Home Management;Cryotherapy;Electrical Stimulation;Iontophoresis 4mg /ml Dexamethasone;Moist Heat;Traction;DME Instruction;Gait training;Stair training;Functional mobility training;Therapeutic activities;Therapeutic exercise;Balance training;Neuromuscular re-education;Patient/family education;Manual techniques;Manual lymph drainage;Compression bandaging;Scar mobilization;Passive range of motion;Dry needling;Energy conservation;Splinting;Taping;Vasopneumatic Device    PT Next Visit Plan Protocol in media and on PT desk and in book, begin PROM per protocol and progress to AAROM/ AROM as able, shoulder flex, abd, ER iso in neutral,    PT Home Exercise Plan 6/14 elbow flex/ext, wrist flex/ext, grip 6/16: table slides, flexion isometric    Consulted and Agree with Plan of Care  Patient             Patient will benefit from skilled therapeutic intervention in order to improve the following deficits and impairments:  Decreased range of motion, Decreased endurance, Increased muscle spasms, Impaired UE functional use, Pain, Decreased skin integrity, Decreased activity tolerance, Impaired flexibility, Improper body mechanics, Decreased strength, Decreased mobility  Visit Diagnosis: Right shoulder pain, unspecified chronicity  Stiffness of right  shoulder, not elsewhere classified  Muscle weakness (generalized)  Other symptoms and signs involving the musculoskeletal system     Problem List Patient Active Problem List   Diagnosis Date Noted   Postprocedural pneumothorax 09/17/2020   Diabetes mellitus type 2 in obese (HCC) 09/17/2020   GERD (gastroesophageal reflux disease) 09/17/2020   Leukocytosis 09/17/2020   S/P reverse total shoulder arthroplasty, right 09/17/2020   Acute respiratory failure with hypoxia (HCC) 09/17/2020   Gout attack 11/08/2018   Edema 10/24/2018   Ankle pain 06/06/2017   Upper airway cough syndrome 12/16/2015   Abnormal WBC count 02/26/2015   Arthritis 01/22/2015   History of gout 01/22/2015   Mild intermittent asthma without complication 01/22/2015   5:35 PM, 10/15/20 Georges Lynch PT DPT  Physical Therapist with [place]  [facility]  [phone]  [facility] [address] [place], [place], [address] Phone: [phone]   Fax:  [phone]  Name: Sheila [name] MRN: [id number] Date of Birth: April 26, 1950

## 2020-10-16 ENCOUNTER — Encounter (HOSPITAL_COMMUNITY): Payer: Medicare Other

## 2020-10-16 DIAGNOSIS — M19011 Primary osteoarthritis, right shoulder: Secondary | ICD-10-CM | POA: Diagnosis not present

## 2020-10-16 DIAGNOSIS — E119 Type 2 diabetes mellitus without complications: Secondary | ICD-10-CM | POA: Diagnosis not present

## 2020-10-22 ENCOUNTER — Encounter (HOSPITAL_COMMUNITY): Payer: Self-pay

## 2020-10-22 ENCOUNTER — Ambulatory Visit (HOSPITAL_COMMUNITY): Payer: Medicare Other | Attending: Orthopaedic Surgery

## 2020-10-22 ENCOUNTER — Other Ambulatory Visit: Payer: Self-pay

## 2020-10-22 DIAGNOSIS — M6281 Muscle weakness (generalized): Secondary | ICD-10-CM | POA: Insufficient documentation

## 2020-10-22 DIAGNOSIS — R29898 Other symptoms and signs involving the musculoskeletal system: Secondary | ICD-10-CM | POA: Diagnosis not present

## 2020-10-22 DIAGNOSIS — M25511 Pain in right shoulder: Secondary | ICD-10-CM | POA: Insufficient documentation

## 2020-10-22 DIAGNOSIS — M25611 Stiffness of right shoulder, not elsewhere classified: Secondary | ICD-10-CM | POA: Diagnosis not present

## 2020-10-22 NOTE — Patient Instructions (Signed)
Theraputty Home Exercise Program  Complete 1-2 times a day.  putty squeeze  Pt. should squeeze putty in hand trying to keep it round by rotating putty after each squeeze. push fingers through putty to palm each time. Complete for ______ minutes.   PUTTY KEY GRIP  Hold the putty at the top of your hand. Squeeze the putty between your thumb and the side of your 2nd finger as shown. Complete for ________ minutes.    PUTTY 3 JAW CHUCK  Roll up some putty into a ball then flatten it. Then, firmly squeeze it with your first 3 fingers as shown. Complete for ______ minutes.          

## 2020-10-22 NOTE — Therapy (Signed)
[facility] [address] [place], [place], [address] Phone: [phone]   Fax:  [phone]  Physical Therapy Treatment  Patient Details  Name: Graycen Degan MRN: [id number] Date of Birth: [birth year] Referring Provider (PT): Ramond Marrow MD   Encounter Date: 10/22/2020   PT End of Session - 10/22/20 1320     Visit Number 7    Number of Visits 24    Date for PT Re-Evaluation 12/23/20    Authorization Type Primary: Medicare Secondary BCBS supplement (no auth)    Progress Note Due on Visit 10    PT Start Time 1315    PT Stop Time 1354    PT Time Calculation (min) 39 min    Activity Tolerance Patient tolerated treatment well    Behavior During Therapy WFL for tasks assessed/performed             Past Medical History:  Diagnosis Date   DM (diabetes mellitus) (HCC)    GERD (gastroesophageal reflux disease)    Gout    Hyperlipidemia    Hypertension     Past Surgical History:  Procedure Laterality Date   REVERSE SHOULDER ARTHROPLASTY Right 09/17/2020   Procedure: REVERSE SHOULDER ARTHROPLASTY;  Surgeon: Bjorn Pippin, MD;  Location: [facility];  Service: Orthopedics;  Laterality: Right;    There were no vitals filed for this visit.   Subjective Assessment - 10/22/20 1318     Subjective Saw MD last Friday and is happy with progress, discontinued sling.  Given additional antibiotic for 10 days due to 1 spot on incision.  No reports of pain, shoulder feels good today.  Reports she has some difficulty with grip noted with difficulty opening door.    Patient Stated Goals Improve arm mobility    Currently in Pain? No/denies                [facility] PT Assessment - 10/22/20 0001       Assessment   Medical Diagnosis R reverse total shoulder arthoplasty    Referring Provider (PT) Ramond Marrow MD    Onset Date/Surgical Date 09/17/20    Next MD Visit 12/11/20      Precautions   Precautions Shoulder                            [facility] Adult PT Treatment/Exercise - 10/22/20 0001       Exercises   Exercises Shoulder      Shoulder Exercises: Supine   External Rotation AAROM;Right;15 reps    Flexion AAROM;Right;15 reps    ABduction AAROM;15 reps      Shoulder Exercises: Seated   Retraction 15 reps      Shoulder Exercises: Sidelying   External Rotation 10 reps   2 sets   External Rotation Limitations 5" holds    ABduction Left;10 reps   2 sets     Shoulder Exercises: Standing   Other Standing Exercises elbow flexion x20    Other Standing Exercises yellow putty grip strengthening- HEP      Manual Therapy   Manual Therapy Passive ROM    Manual therapy comments Completed separate from all other activity    Passive ROM Rt GHJ PROM (flex/ abduction, ER) per protocol                      PT Short Term Goals - 10/02/20 1622  PT SHORT TERM GOAL #1   Title Patient will be independent with HEP in order to improve functional outcomes.    Time 2    Period Weeks    Status On-going    Target Date 10/14/20      PT SHORT TERM GOAL #2   Title Patient will demonstrate R shoulder ROM 90 degrees flexion, 15 ER to show improving ROM per protocol.    Time 2    Period Weeks    Status On-going    Target Date 10/14/20      PT SHORT TERM GOAL #3   Title Patient will demonstrate R shoulder ROM 120 degrees flexion, 30 ER to show improving ROM per protocol.    Time 4    Period Weeks    Status On-going    Target Date 10/28/20               PT Long Term Goals - 10/02/20 1622       PT LONG TERM GOAL #1   Title Patient will report at least 75% improvement in symptoms for improved quality of life.    Time 12    Period Weeks    Status On-going      PT LONG TERM GOAL #2   Title Patient will improve FOTO score by at least 60 points in order to indicate improved tolerance to activity.    Time 12    Period Weeks    Status On-going      PT LONG TERM GOAL #3    Title Patient will demonstrate at least 4+/5  R shoulder MMT (except extension and IR per protocol) in order to improve ability to lift overhead to a cabinet.    Time 12    Period Weeks    Status On-going                   Plan - 10/22/20 1324     Clinical Impression Statement Pt at 5 week post-op.  Session focus with shoulder mobility with PROM then AAROM with PVC pipe.  Pt reports difficulty with decreased grip strength opening doors.  Given additional putty and handout to add to HEP.    Personal Factors and Comorbidities Comorbidity 3+;Fitness;Behavior Pattern;Past/Current Experience;Time since onset of injury/illness/exacerbation    Comorbidities Arthritis, BMI over 30, Diabetes, Osteoporosis    Examination-Activity Limitations Bathing;Bed Mobility;Bend;Dressing;Hygiene/Grooming;Lift;Reach Overhead;Sleep    Examination-Participation Restrictions Meal Prep;Cleaning;Community Activity;Driving;Laundry    Stability/Clinical Decision Making Stable/Uncomplicated    Clinical Decision Making Low    Rehab Potential Good    PT Frequency 2x / week    PT Duration 12 weeks    PT Treatment/Interventions ADLs/Self Care Home Management;Cryotherapy;Electrical Stimulation;Iontophoresis 4mg /ml Dexamethasone;Moist Heat;Traction;DME Instruction;Gait training;Stair training;Functional mobility training;Therapeutic activities;Therapeutic exercise;Balance training;Neuromuscular re-education;Patient/family education;Manual techniques;Manual lymph drainage;Compression bandaging;Scar mobilization;Passive range of motion;Dry needling;Energy conservation;Splinting;Taping;Vasopneumatic Device    PT Next Visit Plan Pt at 5 week post-op.  Protocol in media and on PT desk and in book, begin PROM per protocol and progress to AAROM/ AROM as able, shoulder flex, abd, ER iso in neutral,    PT Home Exercise Plan 6/14 elbow flex/ext, wrist flex/ext, grip 6/16: table slides, flexion isometric    Consulted and Agree with  Plan of Care Patient             Patient will benefit from skilled therapeutic intervention in order to improve the following deficits and impairments:  Decreased range of motion, Decreased endurance, Increased muscle spasms, Impaired UE functional use, Pain,  Decreased skin integrity, Decreased activity tolerance, Impaired flexibility, Improper body mechanics, Decreased strength, Decreased mobility  Visit Diagnosis: Right shoulder pain, unspecified chronicity  Stiffness of right shoulder, not elsewhere classified  Muscle weakness (generalized)  Other symptoms and signs involving the musculoskeletal system     Problem List Patient Active Problem List   Diagnosis Date Noted   Postprocedural pneumothorax 09/17/2020   Diabetes mellitus type 2 in obese (HCC) 09/17/2020   GERD (gastroesophageal reflux disease) 09/17/2020   Leukocytosis 09/17/2020   S/P reverse total shoulder arthroplasty, right 09/17/2020   Acute respiratory failure with hypoxia (HCC) 09/17/2020   Gout attack 11/08/2018   Edema 10/24/2018   Ankle pain 06/06/2017   Upper airway cough syndrome 12/16/2015   Abnormal WBC count 02/26/2015   Arthritis 01/22/2015   History of gout 01/22/2015   Mild intermittent asthma without complication 01/22/2015   Becky Sax, LPTA/CLT; CBIS [phone]  Juel Burrow 10/22/2020, 2:01 PM  [facility] [address] [place], [place], [address] Phone: [phone]   Fax:  [phone]  Name: Brylie Sneath MRN: [id number] Date of Birth: 1951/02/07

## 2020-10-24 ENCOUNTER — Ambulatory Visit (HOSPITAL_COMMUNITY): Payer: Medicare Other

## 2020-10-24 ENCOUNTER — Other Ambulatory Visit: Payer: Self-pay

## 2020-10-24 DIAGNOSIS — M6281 Muscle weakness (generalized): Secondary | ICD-10-CM | POA: Diagnosis not present

## 2020-10-24 DIAGNOSIS — M25611 Stiffness of right shoulder, not elsewhere classified: Secondary | ICD-10-CM | POA: Diagnosis not present

## 2020-10-24 DIAGNOSIS — R29898 Other symptoms and signs involving the musculoskeletal system: Secondary | ICD-10-CM | POA: Diagnosis not present

## 2020-10-24 DIAGNOSIS — M25511 Pain in right shoulder: Secondary | ICD-10-CM

## 2020-10-24 NOTE — Therapy (Signed)
[facility] [address] [place], [place], [address] Phone: [phone]   Fax:  [phone]  Physical Therapy Treatment  Patient Details  Name: Naylene Foell MRN: [id number] Date of Birth: August 11, 1950 Referring Provider (PT): Ramond Marrow MD   Encounter Date: 10/24/2020   PT End of Session - 10/24/20 1300     Visit Number 8    Number of Visits 24    Date for PT Re-Evaluation 12/23/20    Authorization Type Primary: Medicare Secondary BCBS supplement (no auth)    Progress Note Due on Visit 10    PT Start Time 1300    PT Stop Time 1345    PT Time Calculation (min) 45 min    Activity Tolerance Patient tolerated treatment well    Behavior During Therapy WFL for tasks assessed/performed             Past Medical History:  Diagnosis Date   DM (diabetes mellitus) (HCC)    GERD (gastroesophageal reflux disease)    Gout    Hyperlipidemia    Hypertension     Past Surgical History:  Procedure Laterality Date   REVERSE SHOULDER ARTHROPLASTY Right 09/17/2020   Procedure: REVERSE SHOULDER ARTHROPLASTY;  Surgeon: Bjorn Pippin, MD;  Location: [facility];  Service: Orthopedics;  Laterality: Right;    There were no vitals filed for this visit.   Subjective Assessment - 10/24/20 1303     Subjective Pt notes no issues since coming out of the sling and has 5 lbs limitation for lifting, no arm behind the back, and no swimming at this time.    Patient Stated Goals Improve arm mobility                [facility] PT Assessment - 10/24/20 0001       Assessment   Medical Diagnosis R reverse total shoulder arthoplasty    Referring Provider (PT) Ramond Marrow MD    Onset Date/Surgical Date 09/17/20    Next MD Visit 12/11/20      Precautions   Precautions Shoulder    Type of Shoulder Precautions Reverse total shoulder                           [facility] Adult PT Treatment/Exercise - 10/24/20 0001       Shoulder  Exercises: Seated   Flexion AAROM;Right;20 reps    Flexion Limitations using NBQC "Like a gear shifter"    Abduction AAROM;Right;20 reps    ABduction Limitations with cane "Like a gear shifter"      Shoulder Exercises: Standing   Other Standing Exercises bicep curls 3 lbs 2x10    Other Standing Exercises "wax on, wax off" with washcloth on table top 20x      Shoulder Exercises: Pulleys   Flexion 3 minutes      Shoulder Exercises: Therapy Ball   Flexion Both      Manual Therapy   Manual Therapy Passive ROM    Manual therapy comments Completed separate from all other activity    Passive ROM Rt GHJ PROM (flex/ abduction, ER) per protocol                      PT Short Term Goals - 10/02/20 1622       PT SHORT TERM GOAL #1   Title Patient will be independent with HEP in order to improve functional outcomes.    Time  2    Period Weeks    Status On-going    Target Date 10/14/20      PT SHORT TERM GOAL #2   Title Patient will demonstrate R shoulder ROM 90 degrees flexion, 15 ER to show improving ROM per protocol.    Time 2    Period Weeks    Status On-going    Target Date 10/14/20      PT SHORT TERM GOAL #3   Title Patient will demonstrate R shoulder ROM 120 degrees flexion, 30 ER to show improving ROM per protocol.    Time 4    Period Weeks    Status On-going    Target Date 10/28/20               PT Long Term Goals - 10/02/20 1622       PT LONG TERM GOAL #1   Title Patient will report at least 75% improvement in symptoms for improved quality of life.    Time 12    Period Weeks    Status On-going      PT LONG TERM GOAL #2   Title Patient will improve FOTO score by at least 60 points in order to indicate improved tolerance to activity.    Time 12    Period Weeks    Status On-going      PT LONG TERM GOAL #3   Title Patient will demonstrate at least 4+/5  R shoulder MMT (except extension and IR per protocol) in order to improve ability to lift  overhead to a cabinet.    Time 12    Period Weeks    Status On-going                   Plan - 10/24/20 1345     Clinical Impression Statement Pt doing very well with regaining ROM and demo Right shoulder PROM: ext. rotation 35 degrees, flexion 99 degrees, abduction 85 degrees.  Tolerating being out of sling very well and progressing through stage 1 of protcol without incident. Continued sessions indicated to progress right shoulder ROM and progress to isometric strengthening    Personal Factors and Comorbidities Comorbidity 3+;Fitness;Behavior Pattern;Past/Current Experience;Time since onset of injury/illness/exacerbation    Comorbidities Arthritis, BMI over 30, Diabetes, Osteoporosis    Examination-Activity Limitations Bathing;Bed Mobility;Bend;Dressing;Hygiene/Grooming;Lift;Reach Overhead;Sleep    Examination-Participation Restrictions Meal Prep;Cleaning;Community Activity;Driving;Laundry    Stability/Clinical Decision Making Stable/Uncomplicated    Rehab Potential Good    PT Frequency 2x / week    PT Duration 12 weeks    PT Treatment/Interventions ADLs/Self Care Home Management;Cryotherapy;Electrical Stimulation;Iontophoresis 4mg /ml Dexamethasone;Moist Heat;Traction;DME Instruction;Gait training;Stair training;Functional mobility training;Therapeutic activities;Therapeutic exercise;Balance training;Neuromuscular re-education;Patient/family education;Manual techniques;Manual lymph drainage;Compression bandaging;Scar mobilization;Passive range of motion;Dry needling;Energy conservation;Splinting;Taping;Vasopneumatic Device    PT Next Visit Plan Pt at 5 week post-op.  Protocol in media and on PT desk and in book, begin PROM per protocol and progress to AAROM/ AROM as able, shoulder flex, abd, ER iso in neutral,    PT Home Exercise Plan 6/14 elbow flex/ext, wrist flex/ext, grip 6/16: table slides, flexion isometric    Consulted and Agree with Plan of Care Patient              Patient will benefit from skilled therapeutic intervention in order to improve the following deficits and impairments:  Decreased range of motion, Decreased endurance, Increased muscle spasms, Impaired UE functional use, Pain, Decreased skin integrity, Decreased activity tolerance, Impaired flexibility, Improper body mechanics, Decreased strength, Decreased mobility  Visit  Diagnosis: Right shoulder pain, unspecified chronicity  Stiffness of right shoulder, not elsewhere classified  Muscle weakness (generalized)  Other symptoms and signs involving the musculoskeletal system     Problem List Patient Active Problem List   Diagnosis Date Noted   Postprocedural pneumothorax 09/17/2020   Diabetes mellitus type 2 in obese (HCC) 09/17/2020   GERD (gastroesophageal reflux disease) 09/17/2020   Leukocytosis 09/17/2020   S/P reverse total shoulder arthroplasty, right 09/17/2020   Acute respiratory failure with hypoxia (HCC) 09/17/2020   Gout attack 11/08/2018   Edema 10/24/2018   Ankle pain 06/06/2017   Upper airway cough syndrome 12/16/2015   Abnormal WBC count 02/26/2015   Arthritis 01/22/2015   History of gout 01/22/2015   Mild intermittent asthma without complication 01/22/2015   1:48 PM, 10/24/20 M. Shary Decamp, PT, DPT Physical Therapist- [place] Office Number: [phone]   [facility] [address] [place], [place], [address] Phone: [phone]   Fax:  [phone]  Name: Pooja Camuso MRN: [id number] Date of Birth: 14-Sep-1950

## 2020-10-28 ENCOUNTER — Ambulatory Visit (HOSPITAL_COMMUNITY): Payer: Medicare Other

## 2020-10-28 ENCOUNTER — Other Ambulatory Visit: Payer: Self-pay

## 2020-10-28 ENCOUNTER — Encounter (HOSPITAL_COMMUNITY): Payer: Self-pay

## 2020-10-28 DIAGNOSIS — M25611 Stiffness of right shoulder, not elsewhere classified: Secondary | ICD-10-CM

## 2020-10-28 DIAGNOSIS — M25511 Pain in right shoulder: Secondary | ICD-10-CM | POA: Diagnosis not present

## 2020-10-28 DIAGNOSIS — M6281 Muscle weakness (generalized): Secondary | ICD-10-CM | POA: Diagnosis not present

## 2020-10-28 DIAGNOSIS — R29898 Other symptoms and signs involving the musculoskeletal system: Secondary | ICD-10-CM

## 2020-10-28 NOTE — Therapy (Signed)
[facility] [address] [place], [place], [address] Phone: [phone]   Fax:  [phone]  Physical Therapy Treatment  Patient Details  Name: Sheila [name] MRN: [id number] Date of Birth: [birth year] Referring Provider (PT): Ramond Marrow MD   Encounter Date: 10/28/2020   PT End of Session - 10/28/20 1316     Visit Number 9    Number of Visits 24    Date for PT Re-Evaluation 12/23/20    Authorization Type Primary: Medicare Secondary BCBS supplement (no auth)    Progress Note Due on Visit 10    PT Start Time 1309    PT Stop Time 1350    PT Time Calculation (min) 41 min    Activity Tolerance Patient tolerated treatment well    Behavior During Therapy WFL for tasks assessed/performed             Past Medical History:  Diagnosis Date   DM (diabetes mellitus) (HCC)    GERD (gastroesophageal reflux disease)    Gout    Hyperlipidemia    Hypertension     Past Surgical History:  Procedure Laterality Date   REVERSE SHOULDER ARTHROPLASTY Right 09/17/2020   Procedure: REVERSE SHOULDER ARTHROPLASTY;  Surgeon: Bjorn Pippin, MD;  Location: [facility];  Service: Orthopedics;  Laterality: Right;    There were no vitals filed for this visit.   Subjective Assessment - 10/28/20 1315     Subjective Pt stated she feels improvements with grip strength, no longer painful to open up doors.  Shoulder feels good today, no reports of pain today.  Stated the steri strips were very loose and she removed them earlier today.    Patient Stated Goals Improve arm mobility    Currently in Pain? No/denies                               [facility] Adult PT Treatment/Exercise - 10/28/20 0001       Exercises   Exercises Shoulder      Shoulder Exercises: Supine   External Rotation AAROM;Right;15 reps    External Rotation Limitations PVC pipe    Flexion AAROM;Right;15 reps    Flexion Limitations PVC pipe    ABduction  AAROM;15 reps    ABduction Limitations PVC pipe      Shoulder Exercises: Pulleys   Flexion 3 minutes    Scaption 2 minutes      Shoulder Exercises: Therapy Ball   Flexion Both    Flexion Limitations 2 min    ABduction Right    ABduction Limitations 2 min      Shoulder Exercises: Isometric Strengthening   Flexion 5X5"    External Rotation 5X5"    ABduction 5X5"      Manual Therapy   Manual Therapy Passive ROM    Manual therapy comments Completed separate from all other activity    Passive ROM Rt GHJ PROM (flex/ abduction, ER) per protocol                      PT Short Term Goals - 10/02/20 1622       PT SHORT TERM GOAL #1   Title Patient will be independent with HEP in order to improve functional outcomes.    Time 2    Period Weeks    Status On-going    Target Date 10/14/20      PT SHORT TERM GOAL #  2   Title Patient will demonstrate R shoulder ROM 90 degrees flexion, 15 ER to show improving ROM per protocol.    Time 2    Period Weeks    Status On-going    Target Date 10/14/20      PT SHORT TERM GOAL #3   Title Patient will demonstrate R shoulder ROM 120 degrees flexion, 30 ER to show improving ROM per protocol.    Time 4    Period Weeks    Status On-going    Target Date 10/28/20               PT Long Term Goals - 10/02/20 1622       PT LONG TERM GOAL #1   Title Patient will report at least 75% improvement in symptoms for improved quality of life.    Time 12    Period Weeks    Status On-going      PT LONG TERM GOAL #2   Title Patient will improve FOTO score by at least 60 points in order to indicate improved tolerance to activity.    Time 12    Period Weeks    Status On-going      PT LONG TERM GOAL #3   Title Patient will demonstrate at least 4+/5  R shoulder MMT (except extension and IR per protocol) in order to improve ability to lift overhead to a cabinet.    Time 12    Period Weeks    Status On-going                    Plan - 10/28/20 1354     Clinical Impression Statement Pt will be 6 week post-op tomorrow and progressing well.  Session focus on ROM based exercises including PROM and AAROM.  Added isometric exercises with min cueing for mechanics and sequence.  Pt limited by fatigue this session, no reports of pain.    Personal Factors and Comorbidities Comorbidity 3+;Fitness;Behavior Pattern;Past/Current Experience;Time since onset of injury/illness/exacerbation    Comorbidities Arthritis, BMI over 30, Diabetes, Osteoporosis    Examination-Activity Limitations Bathing;Bed Mobility;Bend;Dressing;Hygiene/Grooming;Lift;Reach Overhead;Sleep    Examination-Participation Restrictions Meal Prep;Cleaning;Community Activity;Driving;Laundry    Stability/Clinical Decision Making Stable/Uncomplicated    Clinical Decision Making Low    Rehab Potential Good    PT Frequency 2x / week    PT Duration 12 weeks    PT Treatment/Interventions ADLs/Self Care Home Management;Cryotherapy;Electrical Stimulation;Iontophoresis 4mg /ml Dexamethasone;Moist Heat;Traction;DME Instruction;Gait training;Stair training;Functional mobility training;Therapeutic activities;Therapeutic exercise;Balance training;Neuromuscular re-education;Patient/family education;Manual techniques;Manual lymph drainage;Compression bandaging;Scar mobilization;Passive range of motion;Dry needling;Energy conservation;Splinting;Taping;Vasopneumatic Device    PT Next Visit Plan 10th visit progress note next session.  Pt at 6 week post-op.  Add wall walking.  Protocol in media and on PT desk and in book, begin PROM per protocol and progress to AAROM/ AROM as able, shoulder flex, abd, ER iso in neutral,    PT Home Exercise Plan 6/14 elbow flex/ext, wrist flex/ext, grip 6/16: table slides, flexion isometric    Consulted and Agree with Plan of Care Patient             Patient will benefit from skilled therapeutic intervention in order to improve the  following deficits and impairments:  Decreased range of motion, Decreased endurance, Increased muscle spasms, Impaired UE functional use, Pain, Decreased skin integrity, Decreased activity tolerance, Impaired flexibility, Improper body mechanics, Decreased strength, Decreased mobility  Visit Diagnosis: Right shoulder pain, unspecified chronicity  Stiffness of right shoulder, not elsewhere classified  Muscle weakness (  generalized)  Other symptoms and signs involving the musculoskeletal system     Problem List Patient Active Problem List   Diagnosis Date Noted   Postprocedural pneumothorax 09/17/2020   Diabetes mellitus type 2 in obese (HCC) 09/17/2020   GERD (gastroesophageal reflux disease) 09/17/2020   Leukocytosis 09/17/2020   S/P reverse total shoulder arthroplasty, right 09/17/2020   Acute respiratory failure with hypoxia (HCC) 09/17/2020   Gout attack 11/08/2018   Edema 10/24/2018   Ankle pain 06/06/2017   Upper airway cough syndrome 12/16/2015   Abnormal WBC count 02/26/2015   Arthritis 01/22/2015   History of gout 01/22/2015   Mild intermittent asthma without complication 01/22/2015    Becky Sax, LPTA/CLT; CBIS [phone]  Juel Burrow 10/28/2020, 5:29 PM  [facility] [address] [place], [place], [address] Phone: [phone]   Fax:  [phone]  Name: Roneisha Stern MRN: [id number] Date of Birth: [birth year]

## 2020-10-30 ENCOUNTER — Encounter (HOSPITAL_COMMUNITY): Payer: Self-pay

## 2020-10-30 ENCOUNTER — Other Ambulatory Visit: Payer: Self-pay

## 2020-10-30 ENCOUNTER — Ambulatory Visit (HOSPITAL_COMMUNITY): Payer: Medicare Other

## 2020-10-30 DIAGNOSIS — M25511 Pain in right shoulder: Secondary | ICD-10-CM | POA: Diagnosis not present

## 2020-10-30 DIAGNOSIS — M6281 Muscle weakness (generalized): Secondary | ICD-10-CM

## 2020-10-30 DIAGNOSIS — M25611 Stiffness of right shoulder, not elsewhere classified: Secondary | ICD-10-CM | POA: Diagnosis not present

## 2020-10-30 DIAGNOSIS — R29898 Other symptoms and signs involving the musculoskeletal system: Secondary | ICD-10-CM

## 2020-10-30 NOTE — Therapy (Signed)
[facility] [address] [place], [place], [address] Phone: [phone]   Fax:  [phone]  Physical Therapy Treatment Progress Note Reporting Period 09/30/20 to 10/30/20  See note below for Objective Data and Assessment of Progress/Goals.     Patient Details  Name: Sheila [name] MRN: [id number] Date of Birth: Jun 10, 1950 Referring Provider (PT): Ophelia Charter MD   Encounter Date: 10/30/2020   PT End of Session - 10/30/20 1358     Visit Number 10    Number of Visits 24    Date for PT Re-Evaluation 12/23/20    Authorization Type Primary: Medicare Secondary BCBS supplement (no auth)    Progress Note Due on Visit 10    PT Start Time 1349    PT Stop Time 1435    PT Time Calculation (min) 46 min    Activity Tolerance Patient tolerated treatment well    Behavior During Therapy WFL for tasks assessed/performed             Past Medical History:  Diagnosis Date   DM (diabetes mellitus) ([facility])    GERD (gastroesophageal reflux disease)    Gout    Hyperlipidemia    Hypertension     Past Surgical History:  Procedure Laterality Date   REVERSE SHOULDER ARTHROPLASTY Right 09/17/2020   Procedure: REVERSE SHOULDER ARTHROPLASTY;  Surgeon: Hiram Gash, MD;  Location: [facility];  Service: Orthopedics;  Laterality: Right;    There were no vitals filed for this visit.   Subjective Assessment - 10/30/20 1356     Subjective Pt stated she feels better today, was very fatigued last session think it may have been dehydrated.  Feels she has improved 70% since began therapy.  Reports increased ease opening doors, still has difficulty putting on clothes due to not supposed    Patient Stated Goals Improve arm mobility    Currently in Pain? No/denies                [facility] PT Assessment - 10/30/20 0001       Assessment   Medical Diagnosis R reverse total shoulder arthoplasty    Referring Provider (PT) Ophelia Charter MD    Onset  Date/Surgical Date 09/17/20    Next MD Visit 12/11/20      Precautions   Precautions Shoulder    Type of Shoulder Precautions Reverse total shoulder      Observation/Other Assessments   Focus on Therapeutic Outcomes (FOTO)  62.24% functional   was 4% limited     PROM   Right Shoulder Flexion 112 Degrees   was 80   Right Shoulder ABduction 92 Degrees   was 70   Right Shoulder External Rotation 35 Degrees   was 13                          [facility] Adult PT Treatment/Exercise - 10/30/20 0001       Exercises   Exercises Shoulder      Shoulder Exercises: Pulleys   Flexion 2 minutes    Scaption 2 minutes      Shoulder Exercises: Therapy Ball   Flexion Both;10 reps    ABduction Right;10 reps      Shoulder Exercises: Isometric Strengthening   Flexion 5X5"    External Rotation 5X5"    ABduction 5X5"      Shoulder Exercises: Stretch   Other Shoulder Stretches wall walking 5x flexion and abd, able to make  it to 9, cueing for UT relax      Manual Therapy   Manual Therapy Passive ROM    Manual therapy comments Completed separate from all other activity    Passive ROM Rt GHJ PROM (flex/ abduction, ER) per protocol                      PT Short Term Goals - 10/30/20 1403       PT SHORT TERM GOAL #1   Title Patient will be independent with HEP in order to improve functional outcomes.    Baseline 10/30/20:  Reports compliance with HEP daily.    Status On-going      PT SHORT TERM GOAL #2   Title Patient will demonstrate R shoulder ROM 90 degrees flexion, 15 ER to show improving ROM per protocol.    Baseline 10/30/20:  Flexion at 112, Abd 92, ER at 35    Status Achieved      PT SHORT TERM GOAL #3   Title Patient will demonstrate R shoulder ROM 120 degrees flexion, 30 ER to show improving ROM per protocol.    Baseline 10/30/20:  Flexion at 112, Abd 92, ER at 35    Status Partially Met               PT Long Term Goals - 10/30/20 1408       PT  LONG TERM GOAL #1   Title Patient will report at least 75% improvement in symptoms for improved quality of life.    Baseline 10/30/20: reports improvements by 70%    Status On-going      PT LONG TERM GOAL #2   Title Patient will improve FOTO score by at least 60 points in order to indicate improved tolerance to activity.    Baseline 10/30/20:  FOTO 62.24% functional    Status Achieved      PT LONG TERM GOAL #3   Title Patient will demonstrate at least 4+/5  R shoulder MMT (except extension and IR per protocol) in order to improve ability to lift overhead to a cabinet.    Status On-going                   Plan - 10/30/20 1433     Clinical Impression Statement 10th visit, reviewed goals, ROM measurements and FOTO complete this session to assess self-perceived functional improvements.  Pt progressing well with all goals at 6 weeks post-op.  Pt reports she has 2 days left with antibiotics, therapist looked at incision wiht no redness, heat or drainage.  Educated s/s of infection.  Continued session focus with ROM and strengthening per protocol.  Added wall walking for mobility with verbal and tactile cueing to reduce UT compensation for shoulder range.  Added isometric strengthening against wall for flexion, abd and ER to HEP with printout given.    Personal Factors and Comorbidities Comorbidity 3+;Fitness;Behavior Pattern;Past/Current Experience;Time since onset of injury/illness/exacerbation    Comorbidities Arthritis, BMI over 30, Diabetes, Osteoporosis    Examination-Activity Limitations Bathing;Bed Mobility;Bend;Dressing;Hygiene/Grooming;Lift;Reach Overhead;Sleep    Examination-Participation Restrictions Meal Prep;Cleaning;Community Activity;Driving;Laundry    Stability/Clinical Decision Making Stable/Uncomplicated    Clinical Decision Making Low    Rehab Potential Good    PT Frequency 2x / week    PT Duration 12 weeks    PT Treatment/Interventions ADLs/Self Care Home  Management;Cryotherapy;Electrical Stimulation;Iontophoresis 71m/ml Dexamethasone;Moist Heat;Traction;DME Instruction;Gait training;Stair training;Functional mobility training;Therapeutic activities;Therapeutic exercise;Balance training;Neuromuscular re-education;Patient/family education;Manual techniques;Manual lymph drainage;Compression bandaging;Scar mobilization;Passive range of  motion;Dry needling;Energy conservation;Splinting;Taping;Vasopneumatic Device    PT Next Visit Plan Complete MMT next session.  Continue ROM based exercises, add wall washing.  Progress shoulder exercises in concertic control only.  Avoid IR/externsion. Protocol in media and on PT desk and in book, begin PROM per protocol and progress to AAROM/ AROM as able, shoulder flex, abd, ER iso in neutral,    PT Home Exercise Plan 6/14 elbow flex/ext, wrist flex/ext, grip 6/16: table slides, flexion isometric; 10/30/20: isometric against wall flexion, abd and ER    Consulted and Agree with Plan of Care Patient             Patient will benefit from skilled therapeutic intervention in order to improve the following deficits and impairments:  Decreased range of motion, Decreased endurance, Increased muscle spasms, Impaired UE functional use, Pain, Decreased skin integrity, Decreased activity tolerance, Impaired flexibility, Improper body mechanics, Decreased strength, Decreased mobility  Visit Diagnosis: Right shoulder pain, unspecified chronicity  Stiffness of right shoulder, not elsewhere classified  Muscle weakness (generalized)  Other symptoms and signs involving the musculoskeletal system     Problem List Patient Active Problem List   Diagnosis Date Noted   Postprocedural pneumothorax 09/17/2020   Diabetes mellitus type 2 in obese ([place]) 09/17/2020   GERD (gastroesophageal reflux disease) 09/17/2020   Leukocytosis 09/17/2020   S/P reverse total shoulder arthroplasty, right 09/17/2020   Acute respiratory failure with  hypoxia ([facility]) 09/17/2020   Gout attack 11/08/2018   Edema 10/24/2018   Ankle pain 06/06/2017   Upper airway cough syndrome 12/16/2015   Abnormal WBC count 02/26/2015   Arthritis 01/22/2015   History of gout 01/22/2015   Mild intermittent asthma without complication 48/38/9306   Ihor Austin, LPTA/CLT; CBIS [phone]  Aldona Lento 10/30/2020, 3:54 PM  [facility] [address] [place], [place], [address] Phone: [phone]   Fax:  [phone]  Name: Alize Borrayo MRN: [id number] Date of Birth: 1950/07/26

## 2020-10-30 NOTE — Patient Instructions (Signed)
Strengthening: Isometric Flexion  Using wall for resistance, press right fist into ball using light pressure. Hold 5-10 seconds. Repeat 10 times per set. Do 2 sets per session.   SHOULDER: Abduction (Isometric)  Use wall as resistance. Press arm against pillow. Keep elbow straight. Hold 5-10 seconds. 10 reps per set, 2 sets per day.  External Rotation (Isometric)  Place back of left fist against door frame, with elbow bent. Press fist against door frame. Hold 5-10 seconds. Repeat 10 times. Do 2 sessions per day.  Copyright  VHI. All rights reserved.

## 2020-11-04 ENCOUNTER — Ambulatory Visit (HOSPITAL_COMMUNITY): Payer: Medicare Other

## 2020-11-04 ENCOUNTER — Other Ambulatory Visit: Payer: Self-pay

## 2020-11-04 ENCOUNTER — Encounter (HOSPITAL_COMMUNITY): Payer: Self-pay

## 2020-11-04 DIAGNOSIS — M6281 Muscle weakness (generalized): Secondary | ICD-10-CM | POA: Diagnosis not present

## 2020-11-04 DIAGNOSIS — M25611 Stiffness of right shoulder, not elsewhere classified: Secondary | ICD-10-CM | POA: Diagnosis not present

## 2020-11-04 DIAGNOSIS — R29898 Other symptoms and signs involving the musculoskeletal system: Secondary | ICD-10-CM | POA: Diagnosis not present

## 2020-11-04 DIAGNOSIS — M25511 Pain in right shoulder: Secondary | ICD-10-CM

## 2020-11-04 NOTE — Therapy (Signed)
[facility] [place], [place], [address] Phone: [phone]   Fax:  [phone]  Physical Therapy Treatment  Patient Details  Name: Sheila [name] MRN: [id number] Date of Birth: 1950/12/26 Referring Provider (PT): Ophelia Charter MD   Encounter Date: 11/04/2020   PT End of Session - 11/04/20 1434     Visit Number 11    Number of Visits 24    Date for PT Re-Evaluation 12/23/20    Authorization Type Primary: Medicare Secondary BCBS supplement (no auth)    Progress Note Due on Visit 10    PT Start Time 1430    PT Stop Time 1515    PT Time Calculation (min) 45 min    Activity Tolerance Patient tolerated treatment well    Behavior During Therapy WFL for tasks assessed/performed             Past Medical History:  Diagnosis Date   DM (diabetes mellitus) ([facility])    GERD (gastroesophageal reflux disease)    Gout    Hyperlipidemia    Hypertension     Past Surgical History:  Procedure Laterality Date   REVERSE SHOULDER ARTHROPLASTY Right 09/17/2020   Procedure: REVERSE SHOULDER ARTHROPLASTY;  Surgeon: Hiram Gash, MD;  Location: [facility];  Service: Orthopedics;  Laterality: Right;    There were no vitals filed for this visit.   Subjective Assessment - 11/04/20 1435     Subjective No issues    Patient Stated Goals Improve arm mobility    Currently in Pain? No/denies    Pain Score 0-No pain                [facility] PT Assessment - 11/04/20 0001       Assessment   Medical Diagnosis R reverse total shoulder arthoplasty    Referring Provider (PT) Ophelia Charter MD    Onset Date/Surgical Date 09/17/20    Next MD Visit 12/11/20      Precautions   Precautions Shoulder    Type of Shoulder Precautions Reverse total shoulder                           [facility] Adult PT Treatment/Exercise - 11/04/20 0001       Shoulder Exercises: Seated   Other Seated Exercises bicep curl 3x10 3#       Shoulder Exercises: Pulleys   Flexion 2 minutes      Shoulder Exercises: Therapy Ball   Flexion Both    Flexion Limitations 2 min    ABduction Right   3x10     Shoulder Exercises: ROM/Strengthening   UBE (Upper Arm Bike) level 1 2 min forward, 2 min backward      Shoulder Exercises: Isometric Strengthening   Flexion Other (comment)   2x10, 3 sec hold, against dynadisc   Extension Other (comment)   2x10, 3 sec hold, against dynadisc   ABduction Other (comment)   2x10, 3 sec hold, against dyandisc     Manual Therapy   Manual Therapy Passive ROM    Manual therapy comments Completed separate from all other activity    Passive ROM Rt GHJ PROM (flex/ abduction, ER) per protocol                      PT Short Term Goals - 10/30/20 1403       PT SHORT TERM GOAL #1   Title Patient will be  independent with HEP in order to improve functional outcomes.    Baseline 10/30/20:  Reports compliance with HEP daily.    Status On-going      PT SHORT TERM GOAL #2   Title Patient will demonstrate R shoulder ROM 90 degrees flexion, 15 ER to show improving ROM per protocol.    Baseline 10/30/20:  Flexion at 112, Abd 92, ER at 35    Status Achieved      PT SHORT TERM GOAL #3   Title Patient will demonstrate R shoulder ROM 120 degrees flexion, 30 ER to show improving ROM per protocol.    Baseline 10/30/20:  Flexion at 112, Abd 92, ER at 35    Status Partially Met               PT Long Term Goals - 10/30/20 1408       PT LONG TERM GOAL #1   Title Patient will report at least 75% improvement in symptoms for improved quality of life.    Baseline 10/30/20: reports improvements by 70%    Status On-going      PT LONG TERM GOAL #2   Title Patient will improve FOTO score by at least 60 points in order to indicate improved tolerance to activity.    Baseline 10/30/20:  FOTO 62.24% functional    Status Achieved      PT LONG TERM GOAL #3   Title Patient will demonstrate at least 4+/5  R  shoulder MMT (except extension and IR per protocol) in order to improve ability to lift overhead to a cabinet.    Status On-going                   Plan - 11/04/20 1514     Clinical Impression Statement Progressing very well with POC details and demonstrating PROM improvements for flexion, abduction, ER and does not experience any pain limitations affecting activities. Continued therapy sessions indicated to progress to stage 2 per MD protocol    Personal Factors and Comorbidities Comorbidity 3+;Fitness;Behavior Pattern;Past/Current Experience;Time since onset of injury/illness/exacerbation    Comorbidities Arthritis, BMI over 30, Diabetes, Osteoporosis    Examination-Activity Limitations Bathing;Bed Mobility;Bend;Dressing;Hygiene/Grooming;Lift;Reach Overhead;Sleep    Examination-Participation Restrictions Meal Prep;Cleaning;Community Activity;Driving;Laundry    Stability/Clinical Decision Making Stable/Uncomplicated    Rehab Potential Good    PT Frequency 2x / week    PT Duration 12 weeks    PT Treatment/Interventions ADLs/Self Care Home Management;Cryotherapy;Electrical Stimulation;Iontophoresis 86m/ml Dexamethasone;Moist Heat;Traction;DME Instruction;Gait training;Stair training;Functional mobility training;Therapeutic activities;Therapeutic exercise;Balance training;Neuromuscular re-education;Patient/family education;Manual techniques;Manual lymph drainage;Compression bandaging;Scar mobilization;Passive range of motion;Dry needling;Energy conservation;Splinting;Taping;Vasopneumatic Device    PT Next Visit Plan Complete MMT next session.  Continue ROM based exercises, add wall washing.  Progress shoulder exercises in concertic control only.  Avoid IR/externsion. Protocol in media and on PT desk and in book, begin PROM per protocol and progress to AAROM/ AROM as able, shoulder flex, abd, ER iso in neutral,    PT Home Exercise Plan 6/14 elbow flex/ext, wrist flex/ext, grip 6/16: table  slides, flexion isometric; 10/30/20: isometric against wall flexion, abd and ER    Consulted and Agree with Plan of Care Patient             Patient will benefit from skilled therapeutic intervention in order to improve the following deficits and impairments:  Decreased range of motion, Decreased endurance, Increased muscle spasms, Impaired UE functional use, Pain, Decreased skin integrity, Decreased activity tolerance, Impaired flexibility, Improper body mechanics, Decreased strength, Decreased mobility  Visit Diagnosis: Right shoulder pain, unspecified chronicity  Stiffness of right shoulder, not elsewhere classified  Muscle weakness (generalized)  Other symptoms and signs involving the musculoskeletal system     Problem List Patient Active Problem List   Diagnosis Date Noted   Postprocedural pneumothorax 09/17/2020   Diabetes mellitus type 2 in obese ([facility]) 09/17/2020   GERD (gastroesophageal reflux disease) 09/17/2020   Leukocytosis 09/17/2020   S/P reverse total shoulder arthroplasty, right 09/17/2020   Acute respiratory failure with hypoxia ([facility]) 09/17/2020   Gout attack 11/08/2018   Edema 10/24/2018   Ankle pain 06/06/2017   Upper airway cough syndrome 12/16/2015   Abnormal WBC count 02/26/2015   Arthritis 01/22/2015   History of gout 01/22/2015   Mild intermittent asthma without complication 79/15/0413   3:17 PM, 11/04/20 M. Sherlyn Lees, PT, DPT Physical Therapist- [place] Office Number: [phone]   [facility] [address] [place], [place], [address] Phone: [phone]   Fax:  [phone]  Name: Sheila [name] MRN: [id number] Date of Birth: 09-30-1950

## 2020-11-07 ENCOUNTER — Emergency Department (HOSPITAL_COMMUNITY): Payer: Medicare Other | Admitting: Anesthesiology

## 2020-11-07 ENCOUNTER — Emergency Department (HOSPITAL_COMMUNITY): Payer: Medicare Other

## 2020-11-07 ENCOUNTER — Emergency Department (HOSPITAL_COMMUNITY)
Admission: EM | Admit: 2020-11-07 | Discharge: 2020-11-08 | Disposition: A | Discharge status: Home / Self Care | Payer: Medicare Other | Attending: Gastroenterology | Admitting: Gastroenterology

## 2020-11-07 ENCOUNTER — Encounter (HOSPITAL_COMMUNITY)
Admission: EM | Disposition: A | Discharge status: Home / Self Care | Payer: Self-pay | Source: Home / Self Care | Attending: Emergency Medicine

## 2020-11-07 ENCOUNTER — Encounter (HOSPITAL_COMMUNITY): Payer: Self-pay

## 2020-11-07 ENCOUNTER — Encounter (HOSPITAL_COMMUNITY): Payer: Self-pay | Admitting: *Deleted

## 2020-11-07 ENCOUNTER — Ambulatory Visit (HOSPITAL_COMMUNITY): Payer: Medicare Other

## 2020-11-07 ENCOUNTER — Other Ambulatory Visit: Payer: Self-pay

## 2020-11-07 DIAGNOSIS — E119 Type 2 diabetes mellitus without complications: Secondary | ICD-10-CM | POA: Insufficient documentation

## 2020-11-07 DIAGNOSIS — I10 Essential (primary) hypertension: Secondary | ICD-10-CM | POA: Insufficient documentation

## 2020-11-07 DIAGNOSIS — Z888 Allergy status to other drugs, medicaments and biological substances status: Secondary | ICD-10-CM | POA: Diagnosis not present

## 2020-11-07 DIAGNOSIS — Z87891 Personal history of nicotine dependence: Secondary | ICD-10-CM | POA: Diagnosis not present

## 2020-11-07 DIAGNOSIS — T18128A Food in esophagus causing other injury, initial encounter: Secondary | ICD-10-CM | POA: Diagnosis not present

## 2020-11-07 DIAGNOSIS — K222 Esophageal obstruction: Secondary | ICD-10-CM | POA: Insufficient documentation

## 2020-11-07 DIAGNOSIS — D72829 Elevated white blood cell count, unspecified: Secondary | ICD-10-CM | POA: Diagnosis not present

## 2020-11-07 DIAGNOSIS — Z20822 Contact with and (suspected) exposure to covid-19: Secondary | ICD-10-CM | POA: Diagnosis not present

## 2020-11-07 DIAGNOSIS — K219 Gastro-esophageal reflux disease without esophagitis: Secondary | ICD-10-CM | POA: Insufficient documentation

## 2020-11-07 DIAGNOSIS — K449 Diaphragmatic hernia without obstruction or gangrene: Secondary | ICD-10-CM | POA: Insufficient documentation

## 2020-11-07 DIAGNOSIS — R1310 Dysphagia, unspecified: Secondary | ICD-10-CM | POA: Insufficient documentation

## 2020-11-07 DIAGNOSIS — R0602 Shortness of breath: Secondary | ICD-10-CM

## 2020-11-07 DIAGNOSIS — Z8249 Family history of ischemic heart disease and other diseases of the circulatory system: Secondary | ICD-10-CM | POA: Diagnosis not present

## 2020-11-07 DIAGNOSIS — E785 Hyperlipidemia, unspecified: Secondary | ICD-10-CM | POA: Insufficient documentation

## 2020-11-07 DIAGNOSIS — Z794 Long term (current) use of insulin: Secondary | ICD-10-CM | POA: Diagnosis not present

## 2020-11-07 DIAGNOSIS — X58XXXA Exposure to other specified factors, initial encounter: Secondary | ICD-10-CM | POA: Insufficient documentation

## 2020-11-07 DIAGNOSIS — Z79899 Other long term (current) drug therapy: Secondary | ICD-10-CM | POA: Insufficient documentation

## 2020-11-07 HISTORY — PX: FOREIGN BODY REMOVAL: SHX962

## 2020-11-07 HISTORY — PX: ESOPHAGOGASTRODUODENOSCOPY: SHX5428

## 2020-11-07 LAB — CBC WITH DIFFERENTIAL/PLATELET
Abs Immature Granulocytes: 0.1 10*3/uL — ABNORMAL HIGH (ref 0.00–0.07)
Basophils Absolute: 0.1 10*3/uL (ref 0.0–0.1)
Basophils Relative: 1 %
Eosinophils Absolute: 0.5 10*3/uL (ref 0.0–0.5)
Eosinophils Relative: 3 %
HCT: 40.1 % (ref 36.0–46.0)
Hemoglobin: 12.6 g/dL (ref 12.0–15.0)
Immature Granulocytes: 1 %
Lymphocytes Relative: 16 %
Lymphs Abs: 2.6 10*3/uL (ref 0.7–4.0)
MCH: 28.5 pg (ref 26.0–34.0)
MCHC: 31.4 g/dL (ref 30.0–36.0)
MCV: 90.7 fL (ref 80.0–100.0)
Monocytes Absolute: 0.9 10*3/uL (ref 0.1–1.0)
Monocytes Relative: 6 %
Neutro Abs: 12.6 10*3/uL — ABNORMAL HIGH (ref 1.7–7.7)
Neutrophils Relative %: 73 %
Platelets: 396 10*3/uL (ref 150–400)
RBC: 4.42 MIL/uL (ref 3.87–5.11)
RDW: 15.6 % — ABNORMAL HIGH (ref 11.5–15.5)
WBC: 16.8 10*3/uL — ABNORMAL HIGH (ref 4.0–10.5)
nRBC: 0 % (ref 0.0–0.2)

## 2020-11-07 LAB — BASIC METABOLIC PANEL
Anion gap: 9 (ref 5–15)
BUN: 29 mg/dL — ABNORMAL HIGH (ref 8–23)
CO2: 24 mmol/L (ref 22–32)
Calcium: 9.3 mg/dL (ref 8.9–10.3)
Chloride: 105 mmol/L (ref 98–111)
Creatinine, Ser: 1.51 mg/dL — ABNORMAL HIGH (ref 0.44–1.00)
GFR, Estimated: 38 mL/min — ABNORMAL LOW (ref >60)
Glucose, Bld: 168 mg/dL — ABNORMAL HIGH (ref 70–99)
Potassium: 3.7 mmol/L (ref 3.5–5.1)
Sodium: 138 mmol/L (ref 135–145)

## 2020-11-07 LAB — RESP PANEL BY RT-PCR (FLU A&B, COVID) ARPGX2
Influenza A by PCR: NEGATIVE
Influenza B by PCR: NEGATIVE
SARS Coronavirus 2 by RT PCR: NEGATIVE

## 2020-11-07 LAB — CBG MONITORING, ED: Glucose-Capillary: 167 mg/dL — ABNORMAL HIGH (ref 70–99)

## 2020-11-07 SURGERY — EGD (ESOPHAGOGASTRODUODENOSCOPY)
Anesthesia: General

## 2020-11-07 MED ORDER — LACTATED RINGERS IV SOLN
INTRAVENOUS | Status: AC | PRN
  Administered 2020-11-07: 20 mL/h via INTRAVENOUS

## 2020-11-07 MED ORDER — SUCCINYLCHOLINE CHLORIDE 200 MG/10ML IV SOSY
PREFILLED_SYRINGE | INTRAVENOUS | Status: DC | PRN
  Administered 2020-11-07: 90 mg via INTRAVENOUS

## 2020-11-07 MED ORDER — ONDANSETRON HCL 4 MG/2ML IJ SOLN
INTRAMUSCULAR | Status: DC | PRN
  Administered 2020-11-07: 4 mg via INTRAVENOUS

## 2020-11-07 MED ORDER — DEXAMETHASONE SODIUM PHOSPHATE 10 MG/ML IJ SOLN
INTRAMUSCULAR | Status: DC | PRN
  Administered 2020-11-07: 10 mg via INTRAVENOUS

## 2020-11-07 MED ORDER — SODIUM CHLORIDE 0.9 % IV SOLN: INTRAVENOUS | Status: DC

## 2020-11-07 MED ORDER — FENTANYL CITRATE (PF) 100 MCG/2ML IJ SOLN
INTRAMUSCULAR | Status: DC | PRN
  Administered 2020-11-07 (×2): 50 ug via INTRAVENOUS

## 2020-11-07 MED ORDER — LIDOCAINE 2% (20 MG/ML) 5 ML SYRINGE
INTRAMUSCULAR | Status: DC | PRN
  Administered 2020-11-07: 60 mg via INTRAVENOUS

## 2020-11-07 MED ORDER — MIDAZOLAM HCL (PF) 5 MG/ML IJ SOLN
INTRAMUSCULAR | Status: DC | PRN
  Administered 2020-11-07: 2 mg via INTRAVENOUS

## 2020-11-07 MED ORDER — LABETALOL HCL 5 MG/ML IV SOLN
INTRAVENOUS | Status: DC | PRN
  Administered 2020-11-07: 5 mg via INTRAVENOUS

## 2020-11-07 MED ORDER — LACTATED RINGERS IV SOLN: INTRAVENOUS | Status: DC | PRN

## 2020-11-07 MED ORDER — MIDAZOLAM HCL (PF) 5 MG/ML IJ SOLN
INTRAMUSCULAR | Status: AC
  Filled 2020-11-07: qty 1

## 2020-11-07 MED ORDER — GLUCAGON HCL RDNA (DIAGNOSTIC) 1 MG IJ SOLR
1.0000 mg | Freq: Once | INTRAMUSCULAR | Status: AC
  Administered 2020-11-07: 1 mg via INTRAMUSCULAR
  Filled 2020-11-07: qty 1

## 2020-11-07 MED ORDER — PROPOFOL 10 MG/ML IV BOLUS
INTRAVENOUS | Status: DC | PRN
  Administered 2020-11-07: 200 mg via INTRAVENOUS

## 2020-11-07 MED ORDER — FENTANYL CITRATE (PF) 100 MCG/2ML IJ SOLN
INTRAMUSCULAR | Status: AC
  Filled 2020-11-07: qty 2

## 2020-11-07 NOTE — Anesthesia Preprocedure Evaluation (Addendum)
Anesthesia Evaluation  Patient identified by MRN, date of birth, ID band Patient awake    Reviewed: Allergy & Precautions, NPO status , Patient's Chart, lab work & pertinent test results  Airway Mallampati: II  TM Distance: >3 FB Neck ROM: Full    Dental no notable dental hx.    Pulmonary asthma , former smoker,    Pulmonary exam normal breath sounds clear to auscultation       Cardiovascular hypertension, Normal cardiovascular exam Rhythm:Regular Rate:Normal  ECG: NSR, rate 68   Neuro/Psych negative neurological ROS  negative psych ROS   GI/Hepatic Neg liver ROS, GERD  Medicated and Controlled,  Endo/Other  diabetes, Insulin Dependent  Renal/GU negative Renal ROS     Musculoskeletal  (+) Arthritis , Gout   Abdominal   Peds  Hematology HLD   Anesthesia Other Findings Esophageal food impaction  Reproductive/Obstetrics                           Anesthesia Physical Anesthesia Plan  ASA: 2 and emergent  Anesthesia Plan: General   Post-op Pain Management:    Induction: Intravenous and Rapid sequence  PONV Risk Score and Plan: 3 and Ondansetron, Dexamethasone and Treatment may vary due to age or medical condition  Airway Management Planned: Oral ETT and Video Laryngoscope Planned  Additional Equipment:   Intra-op Plan:   Post-operative Plan: Extubation in OR  Informed Consent: I have reviewed the patients History and Physical, chart, labs and discussed the procedure including the risks, benefits and alternatives for the proposed anesthesia with the patient or authorized representative who has indicated his/her understanding and acceptance.     Dental advisory given  Plan Discussed with: CRNA  Anesthesia Plan Comments:         Anesthesia Quick Evaluation

## 2020-11-07 NOTE — Consult Note (Signed)
Consultation  Referring Provider:    Carroll Sage, PA-C [facility], ER) Primary Care Physician:  Ignatius Specking, MD   Reason for Consultation:     Esophageal food impaction, dysphagia         HPI:   Sheila [name] is a 70 y.o. female with a history of diabetes, GERD, hyperlipidemia, hypertension, recent right shoulder surgery complicated by iatrogenic pneumothorax requiring chest tube insertion in June 2022 (chest tube has since been removed), presenting to [facility] ER with esophageal food impaction earlier this evening.  Has been having intermittent solid food dysphagia for the last year or so, but no prior food impactions.  Typically able to clear dysphagia symptoms with walking around, fluids, and occasionally food regurgitation.  She may have had a previous EGD 15+ years ago in [place], but she cannot recall any details.  Was otherwise in her usual state of health and was eating barbecue today at 1 PM when she felt the food got stuck, pointing to her upper sternal area.  Since then, has not been able to tolerate any liquids or secretions.  Was seen by Dr. Levon Hedger (GI) in the [facility] ER earlier this evening and had planned on doing emergent upper endoscopy.  He appropriately requested anesthesia support, but was told by Dr. Alva Garnet (Anesthesia) that the patient required higher level of anesthesia care due to history of difficult airway, recent iatrogenic pneumothorax, and comorbidities, and recommended patient transfer to [facility].  She now presents to [facility] for definitive management.  Separately, does report history of reflux, which was generally well controlled omeprazole 20 mg/day for years.  - CXR normal - Normal CBC and BMP  Past Medical History:  Diagnosis Date   DM (diabetes mellitus) (HCC)    GERD (gastroesophageal reflux disease)    Gout    Hyperlipidemia    Hypertension     Past Surgical History:  Procedure Laterality Date   REVERSE  SHOULDER ARTHROPLASTY Right 09/17/2020   Procedure: REVERSE SHOULDER ARTHROPLASTY;  Surgeon: Bjorn Pippin, MD;  Location: [facility];  Service: Orthopedics;  Laterality: Right;    Family History  Problem Relation Age of Onset   Heart disease Father      Social History   Tobacco Use   Smoking status: Former    Packs/day: 1.00    Years: 2.00    Pack years: 2.00    Types: Cigarettes    Quit date: 04/20/1975    Years since quitting: 45.5   Smokeless tobacco: Never  Substance Use Topics   Alcohol use: Never   Drug use: Never    Prior to Admission medications   Medication Sig Start Date End Date Taking? Authorizing Provider  ASPERCREME LIDOCAINE 4 % CREA Apply 1 application topically as needed (for pain).   Yes [provider]  colchicine-probenecid 0.5-500 MG tablet Take 1 tablet by mouth daily. 04/12/19  Yes [provider]  furosemide (LASIX) 40 MG tablet Take 40 mg by mouth 2 (two) times daily. 04/26/18  Yes [provider]  NOVOLOG FLEXPEN 100 UNIT/ML FlexPen Inject 16 Units into the skin See admin instructions. Inject 16 units into thee skin two times a day before meals, per sliding scale 03/13/18  Yes [provider]  omeprazole (PRILOSEC) 20 MG capsule Take 1 capsule (20 mg total) by mouth daily. To gastric protection while taking NSAIDs 09/17/20 11/07/20 Yes McBane, Jerald Kief, PA-C  polyethylene glycol (MIRALAX / GLYCOLAX) 17 g  packet Take 17 g by mouth daily. 09/19/20  Yes Kathlen Mody, MD  traMADol (ULTRAM) 50 MG tablet Take 50 mg by mouth 3 (three) times daily as needed. 10/12/20  Yes [provider]  TRESIBA FLEXTOUCH 200 UNIT/ML SOPN Inject 52 Units into the skin every evening. 03/13/18  Yes [provider]  atorvastatin (LIPITOR) 20 MG tablet Take 20 mg by mouth at bedtime. Patient not taking: No sig reported 05/05/19   [provider]  CONTOUR NEXT TEST test strip 4 (four) times daily. for testing  03/13/18   [provider]  docusate sodium (COLACE) 100 MG capsule Take 1 capsule (100 mg total) by mouth 2 (two) times daily. Patient not taking: No sig reported 09/19/20   Kathlen Mody, MD  GLOBAL EASE INJECT PEN NEEDLES 31G X 5 MM MISC 4 (four) times daily. 04/17/18   [provider]  JARDIANCE 25 MG TABS tablet Take 12.5 mg by mouth in the morning. Patient not taking: Reported on 11/07/2020    [provider]  omeprazole (PRILOSEC) 20 MG capsule Take 20 mg by mouth daily before breakfast. Patient not taking: Reported on 11/07/2020    [provider]  triamcinolone cream (KENALOG) 0.1 % Apply 1 application topically 3 (three) times daily as needed (for itching). Patient not taking: Reported on 11/07/2020 06/06/20   [provider]  Insulin Detemir (LEVEMIR FLEXTOUCH) 100 UNIT/ML Pen Levemir FlexTouch U-100 Insulin 100 unit/mL (3 mL) subcutaneous pen  05/03/18  [provider]    No current facility-administered medications for this encounter.   Current Outpatient Medications  Medication Sig Dispense Refill   ASPERCREME LIDOCAINE 4 % CREA Apply 1 application topically as needed (for pain).     colchicine-probenecid 0.5-500 MG tablet Take 1 tablet by mouth daily.     furosemide (LASIX) 40 MG tablet Take 40 mg by mouth 2 (two) times daily.     NOVOLOG FLEXPEN 100 UNIT/ML FlexPen Inject 16 Units into the skin See admin instructions. Inject 16 units into thee skin two times a day before meals, per sliding scale     omeprazole (PRILOSEC) 20 MG capsule Take 1 capsule (20 mg total) by mouth daily. To gastric protection while taking NSAIDs 30 capsule 0   polyethylene glycol (MIRALAX / GLYCOLAX) 17 g packet Take 17 g by mouth daily. 14 each 0   traMADol (ULTRAM) 50 MG tablet Take 50 mg by mouth 3 (three) times daily as needed.     TRESIBA FLEXTOUCH 200 UNIT/ML SOPN Inject 52 Units into the skin every evening.     atorvastatin (LIPITOR) 20 MG tablet  Take 20 mg by mouth at bedtime. (Patient not taking: No sig reported)     CONTOUR NEXT TEST test strip 4 (four) times daily. for testing     docusate sodium (COLACE) 100 MG capsule Take 1 capsule (100 mg total) by mouth 2 (two) times daily. (Patient not taking: No sig reported) 10 capsule 0   GLOBAL EASE INJECT PEN NEEDLES 31G X 5 MM MISC 4 (four) times daily.     JARDIANCE 25 MG TABS tablet Take 12.5 mg by mouth in the morning. (Patient not taking: Reported on 11/07/2020)     omeprazole (PRILOSEC) 20 MG capsule Take 20 mg by mouth daily before breakfast. (Patient not taking: Reported on 11/07/2020)     triamcinolone cream (KENALOG) 0.1 % Apply 1 application topically 3 (three) times daily as needed (for itching). (Patient not taking: Reported on 11/07/2020)  Allergies as of 11/07/2020 - Review Complete 11/07/2020  Allergen Reaction Noted   Lisinopril Diarrhea 10/24/2018   Metformin Diarrhea 01/21/2015     Review of Systems:    As per HPI, otherwise negative    Physical Exam:  Vital signs in last 24 hours: Temp:  [99.1 F (37.3 C)-99.3 F (37.4 C)] 99.1 F (37.3 C) (07/22 2125) Pulse Rate:  [78-92] 92 (07/22 2125) Resp:  [18-20] 18 (07/22 2125) BP: (159-183)/(70-80) 159/80 (07/22 2125) SpO2:  [97 %-98 %] 98 % (07/22 2125)   General:   Pleasant female in NAD.  Sitting upright and spitting into emesis bag Eyes:   No icterus.   Conjunctiva pink. Lungs:  Respirations even and unlabored. Lungs clear to auscultation bilaterally.   No wheezes, crackles, or rhonchi.  Heart:  Regular rate and rhythm; no MRG Abdomen:  Soft, nondistended, nontender. Normal bowel sounds. No appreciable masses or hepatomegaly.  Rectal:  Not performed.  Msk:  Symmetrical without gross deformities.  Extremities:  Without edema. Neurologic:  Alert and  oriented x4;  grossly normal neurologically. Skin:  Intact without significant lesions or rashes. Psych:  Alert and cooperative. Normal affect.  LAB  RESULTS: Recent Labs    11/07/20 1717  WBC 16.8*  HGB 12.6  HCT 40.1  PLT 396   BMET Recent Labs    11/07/20 1717  NA 138  K 3.7  CL 105  CO2 24  GLUCOSE 168*  BUN 29*  CREATININE 1.51*  CALCIUM 9.3   LFT No results for input(s): PROT, ALBUMIN, AST, ALT, ALKPHOS, BILITOT, BILIDIR, IBILI in the last 72 hours. PT/INR No results for input(s): LABPROT, INR in the last 72 hours.  STUDIES: DG Chest 1 View  Result Date: 11/07/2020 CLINICAL DATA:  70 year old female with difficulty swallowing. Patient choked on food earlier. EXAM: CHEST  1 VIEW COMPARISON:  Chest radiograph dated 09/18/2020. FINDINGS: No focal consolidation, pleural effusion, or pneumothorax. Stable cardiac silhouette. No acute osseous pathology. Right shoulder arthroplasty. IMPRESSION: No active disease. Electronically Signed   By: Elgie Collard M.D.   On: 11/07/2020 19:09     PREVIOUS ENDOSCOPIES:            - EGD: Patient unsure.  May have had EGD 15+ years ago in [place] but does not recall any details - Colonoscopy: 8 years ago, reportedly normal per patient   Impression / Plan:   1) Esophageal food impaction 2) Dysphagia 3) History of GERD - Plan for emergent EGD for food retrieval - Anesthesia service consulted due to elevated periprocedural risks     LOS: 0 days   Shellia Cleverly  11/07/2020, 10:32 PM

## 2020-11-07 NOTE — Transfer of Care (Signed)
Immediate Anesthesia Transfer of Care Note  Patient: Sheila [name]  Procedure(s) Performed: ESOPHAGOGASTRODUODENOSCOPY (EGD) FOREIGN BODY REMOVAL  Patient Location: PACU  Anesthesia Type:General  Level of Consciousness: awake, alert  and oriented  Airway & Oxygen Therapy: Patient Spontanous Breathing and Patient connected to face mask oxygen  Post-op Assessment: Report given to RN and Post -op Vital signs reviewed and stable  Post vital signs: Reviewed and stable  Last Vitals:  Vitals Value Taken Time  BP 118/90   Temp    Pulse 90   Resp 16   SpO2 100     Last Pain:  Vitals:   11/07/20 2308  TempSrc:   PainSc: 3          Complications: No notable events documented.

## 2020-11-07 NOTE — Anesthesia Procedure Notes (Signed)
Procedure Name: Intubation Date/Time: 11/07/2020 11:29 PM Performed by: Zollie Scale, CRNA Pre-anesthesia Checklist: Patient identified, Emergency Drugs available, Suction available and Patient being monitored Patient Re-evaluated:Patient Re-evaluated prior to induction Oxygen Delivery Method: Circle System Utilized Preoxygenation: Pre-oxygenation with 100% oxygen Induction Type: IV induction, Rapid sequence and Cricoid Pressure applied Laryngoscope Size: Glidescope and 3 Grade View: Grade I Tube type: Oral Tube size: 7.0 mm Number of attempts: 1 Airway Equipment and Method: Stylet Placement Confirmation: ETT inserted through vocal cords under direct vision, positive ETCO2 and breath sounds checked- equal and bilateral Secured at: 21 cm Tube secured with: Tape Dental Injury: Teeth and Oropharynx as per pre-operative assessment  Comments: Glidescope utilized d/t previous difficult airway notes, G1v with Glidescope

## 2020-11-07 NOTE — Consult Note (Signed)
Maylon Peppers, M.D. Gastroenterology & Hepatology                                           Patient Name: Sheila [name] Account #: @FLAACCTNO @   MRN: [id number] Admission Date: 11/07/2020 Date of Evaluation:  11/07/2020 Time of Evaluation: 7:20 PM   Referring Physician: Milton Ferguson, MD  Chief Complaint: food impaction.  NOM:[facility] Sheila [name] is a 70 y.o. female with past medical history of diabetes, GERD, hyperlipidemia, hypertension and recent right shoulder surgery complicated by iatrogenic pneumothorax requiring chest tube insertion in June 2022 , coming to the hospital for evaluation of food impaction.  Patient has not presented a previous episodes of food impaction in the past.   The patient reports he was eating barbecue today at 1 PM and she felt the food got stuck in the retrosternal area.  States that she has not been able to swallow any water or any kind of food and since then, states that she feels the food is stuck in her mid chest.  States that for the last year she has presented intermittent episodes of dysphagia with solids for the last year, almost once a week.  States that she felt it took longer to go down but never had a food impaction in the past.  Drooling: No but is constantly spitting Able to swallow food: No Able to drink liquids"no Previous reflux symptoms: Yes, had history of heartburn, the patient was taking omeprazole 20 mg 4 days for multiple years but she reports that many times she forgot to take it Weight changes: No Seasonal allegies: No  Previous EGD: Never Previous colonoscopy: 8 years ago, patient reports it was normal   In the ER, his vital signs were stable. He was protecting her airway Labs were notable for normal CBC and BMP.  Chest x-ray today was within normal limits.  Past Medical History: SEE CHRONIC ISSSUES: Past Medical History:  Diagnosis Date   DM (diabetes mellitus) ([place])    GERD (gastroesophageal reflux disease)    Gout    Hyperlipidemia     Hypertension    Past Surgical History:  Past Surgical History:  Procedure Laterality Date   REVERSE SHOULDER ARTHROPLASTY Right 09/17/2020   Procedure: REVERSE SHOULDER ARTHROPLASTY;  Surgeon: Hiram Gash, MD;  Location: [place];  Service: Orthopedics;  Laterality: Right;   Family History:  Family History  Problem Relation Age of Onset   Heart disease Father    Social History:  Social History   Tobacco Use   Smoking status: Former    Packs/day: 1.00    Years: 2.00    Pack years: 2.00    Types: Cigarettes    Quit date: 04/20/1975    Years since quitting: 45.5   Smokeless tobacco: Never  Substance Use Topics   Alcohol use: Never   Drug use: Never    Home Medications:  Prior to Admission medications   Medication Sig Start Date End Date Taking? Authorizing Provider  ASPERCREME LIDOCAINE 4 % CREA Apply 1 application topically as needed (for pain).    [provider]  atorvastatin (LIPITOR) 20 MG tablet Take 20 mg by mouth at bedtime. Patient not taking: No sig reported 05/05/19   [provider]  colchicine-probenecid 0.5-500 MG tablet Take 1 tablet by mouth daily. 04/12/19   [provider]  CONTOUR NEXT TEST test strip  4 (four) times daily. for testing 03/13/18   [provider]  docusate sodium (COLACE) 100 MG capsule Take 1 capsule (100 mg total) by mouth 2 (two) times daily. 09/19/20   Hosie Poisson, MD  furosemide (LASIX) 40 MG tablet Take 40 mg by mouth 2 (two) times daily. 04/26/18   [provider]  GLOBAL EASE INJECT PEN NEEDLES 31G X 5 MM MISC 4 (four) times daily. 04/17/18   [provider]  JARDIANCE 25 MG TABS tablet Take 12.5 mg by mouth in the morning.    [provider]  NOVOLOG FLEXPEN 100 UNIT/ML FlexPen Inject 16 Units into the skin See admin instructions. Inject 16 units into thee skin two times a day before meals, per sliding scale 03/13/18   [provider]  omeprazole  (PRILOSEC) 20 MG capsule Take 20 mg by mouth daily before breakfast.    [provider]  omeprazole (PRILOSEC) 20 MG capsule Take 1 capsule (20 mg total) by mouth daily. To gastric protection while taking NSAIDs 09/17/20 10/17/20  McBane, Maylene Roes, PA-C  polyethylene glycol (MIRALAX / GLYCOLAX) 17 g packet Take 17 g by mouth daily. 09/19/20   Hosie Poisson, MD  TRESIBA FLEXTOUCH 200 UNIT/ML SOPN Inject 52 Units into the skin every evening. 03/13/18   [provider]  triamcinolone cream (KENALOG) 0.1 % Apply 1 application topically 3 (three) times daily as needed (for itching). 06/06/20   [provider]  Insulin Detemir (LEVEMIR FLEXTOUCH) 100 UNIT/ML Pen Levemir FlexTouch U-100 Insulin 100 unit/mL (3 mL) subcutaneous pen  05/03/18  [provider]    Inpatient Medications: No current facility-administered medications for this encounter.  Current Outpatient Medications:    ASPERCREME LIDOCAINE 4 % CREA, Apply 1 application topically as needed (for pain)., Disp: , Rfl:    atorvastatin (LIPITOR) 20 MG tablet, Take 20 mg by mouth at bedtime. (Patient not taking: No sig reported), Disp: , Rfl:    colchicine-probenecid 0.5-500 MG tablet, Take 1 tablet by mouth daily., Disp: , Rfl:    CONTOUR NEXT TEST test strip, 4 (four) times daily. for testing, Disp: , Rfl:    docusate sodium (COLACE) 100 MG capsule, Take 1 capsule (100 mg total) by mouth 2 (two) times daily., Disp: 10 capsule, Rfl: 0   furosemide (LASIX) 40 MG tablet, Take 40 mg by mouth 2 (two) times daily., Disp: , Rfl:    GLOBAL EASE INJECT PEN NEEDLES 31G X 5 MM MISC, 4 (four) times daily., Disp: , Rfl:    JARDIANCE 25 MG TABS tablet, Take 12.5 mg by mouth in the morning., Disp: , Rfl:    NOVOLOG FLEXPEN 100 UNIT/ML FlexPen, Inject 16 Units into the skin See admin instructions. Inject 16 units into thee skin two times a day before meals, per sliding scale, Disp: , Rfl:    omeprazole (PRILOSEC) 20 MG capsule, Take  20 mg by mouth daily before breakfast., Disp: , Rfl:    omeprazole (PRILOSEC) 20 MG capsule, Take 1 capsule (20 mg total) by mouth daily. To gastric protection while taking NSAIDs, Disp: 30 capsule, Rfl: 0   polyethylene glycol (MIRALAX / GLYCOLAX) 17 g packet, Take 17 g by mouth daily., Disp: 14 each, Rfl: 0   TRESIBA FLEXTOUCH 200 UNIT/ML SOPN, Inject 52 Units into the skin every evening., Disp: , Rfl:    triamcinolone cream (KENALOG) 0.1 %, Apply 1 application topically 3 (three) times daily as needed (for itching)., Disp: , Rfl:  Allergies: Lisinopril and Metformin  Complete Review of Systems: GENERAL: negative for malaise, night sweats HEENT: No changes in hearing or vision, no nose bleeds or other nasal problems. NECK: Negative for lumps, goiter, pain and significant neck swelling RESPIRATORY: Negative for cough, wheezing CARDIOVASCULAR: Negative for chest pain, leg swelling, palpitations, orthopnea GI: SEE HPI MUSCULOSKELETAL: Negative for joint pain or swelling, back pain, and muscle pain. SKIN: Negative for lesions, rash PSYCH: Negative for sleep disturbance, mood disorder and recent psychosocial stressors. HEMATOLOGY Negative for prolonged bleeding, bruising easily, and swollen nodes. ENDOCRINE: Negative for cold or heat intolerance, polyuria, polydipsia and goiter. NEURO: negative for tremor, gait imbalance, syncope and seizures. The remainder of the review of systems is noncontributory.  Physical Exam: BP (!) 183/76 (BP Location: Left Arm)   Pulse 78   Temp 99.3 F (37.4 C)   Resp 18   SpO2 98%  GENERAL: The patient is AO x3, in no acute distress. Obese. HEENT: Head is normocephalic and atraumatic. EOMI are intact. Mouth is well hydrated and without lesions. NECK: Supple. No masses LUNGS: Clear to auscultation. No presence of rhonchi/wheezing/rales. Adequate chest expansion HEART: RRR, normal s1 and s2. ABDOMEN: Soft, nontender, no guarding, no peritoneal signs, and  nondistended. BS +. No masses. EXTREMITIES: Without any cyanosis, clubbing, rash, lesions or edema. NEUROLOGIC: AOx3, no focal motor deficit. SKIN: no jaundice, no rashes  Laboratory Data CBC:     Component Value Date/Time   WBC 16.8 (H) 11/07/2020 1717   RBC 4.42 11/07/2020 1717   HGB 12.6 11/07/2020 1717   HCT 40.1 11/07/2020 1717   PLT 396 11/07/2020 1717   MCV 90.7 11/07/2020 1717   MCH 28.5 11/07/2020 1717   MCHC 31.4 11/07/2020 1717   RDW 15.6 (H) 11/07/2020 1717   LYMPHSABS 2.6 11/07/2020 1717   MONOABS 0.9 11/07/2020 1717   EOSABS 0.5 11/07/2020 1717   BASOSABS 0.1 11/07/2020 1717   COAG: No results found for: INR, PROTIME  BMP:  BMP Latest Ref Rng & Units 11/07/2020 09/18/2020 09/17/2020  Glucose 70 - 99 mg/dL 168(H) 308(H) 224(H)  BUN 8 - 23 mg/dL 29(H) 22 25(H)  Creatinine 0.44 - 1.00 mg/dL 1.51(H) 1.20(H) 1.16(H)  Sodium 135 - 145 mmol/L 138 135 135  Potassium 3.5 - 5.1 mmol/L 3.7 4.0 3.5  Chloride 98 - 111 mmol/L 105 99 99  CO2 22 - 32 mmol/L 24 24 24   Calcium 8.9 - 10.3 mg/dL 9.3 8.8(L) 9.2    HEPATIC:  Hepatic Function Latest Ref Rng & Units 09/17/2020  Total Protein 6.5 - 8.1 g/dL 7.8  Albumin 3.5 - 5.0 g/dL 3.4(L)  AST 15 - 41 U/L 36  ALT 0 - 44 U/L 34  Alk Phosphatase 38 - 126 U/L 113  Total Bilirubin 0.3 - 1.2 mg/dL 0.4    CARDIAC: No results found for: CKTOTAL, CKMB, CKMBINDEX, TROPONINI   Imaging: I personally reviewed and interpreted the available imaging.  Assessment & Plan: Christine Morton is a 70 y.o. female with past medical history of diabetes, GERD, hyperlipidemia, hypertension and recent right shoulder surgery complicated by iatrogenic pneumothorax requiring chest tube insertion in June 2022 , coming to the hospital for evaluation of food impaction. The patient has not presented any previous red flag signs. DDx includes possible peptic strictures vs less likely EoE . For now, we'll  keep the patient nothing by mouth until the procedure is performed.  He will require general anesthesia due to the risk of aspiration.  - Keep NPO - Emergent EGD - Anesthesia evaluation for  general anesthesia - high risk of aspiration  Harvel Quale, MD Gastroenterology and Hepatology [facility]  ADDENDUM: Discussed the case with anesthesiologist on-call Dr. Charna Elizabeth who considered that the patient has multiple risks that require higher level of anesthesia care as she had a difficult airway based on her most recent orthopedic intervention, but also she has high risk of recurrent pneumothorax given recent episode of pneumothorax requiring chest tube insertion.  Anesthesia recommended transferring the patient to [facility] due to location capabilities.  I discussed this with the patient who understood and agreed.  I also communicated this with the ER.  Maylon Peppers, MD Gastroenterology and Hepatology [facility]

## 2020-11-07 NOTE — ED Notes (Signed)
Pt transported to xray 

## 2020-11-07 NOTE — ED Notes (Signed)
Pt taken to endo via wheelchair

## 2020-11-07 NOTE — H&P (View-Only) (Signed)
Consultation  Referring Provider:    Carroll Sage, PA-C [facility] [place], ER) Primary Care Physician:  Ignatius Specking, MD   Reason for Consultation:     Esophageal food impaction, dysphagia         HPI:   Sheila [name] is a 70 y.o. female with a history of diabetes, GERD, hyperlipidemia, hypertension, recent right shoulder surgery complicated by iatrogenic pneumothorax requiring chest tube insertion in June 2022 (chest tube has since been removed), presenting to [facility] ER with esophageal food impaction earlier this evening.  Has been having intermittent solid food dysphagia for the last year or so, but no prior food impactions.  Typically able to clear dysphagia symptoms with walking around, fluids, and occasionally food regurgitation.  She may have had a previous EGD 15+ years ago in [place], but she cannot recall any details.  Was otherwise in her usual state of health and was eating barbecue today at 1 PM when she felt the food got stuck, pointing to her upper sternal area.  Since then, has not been able to tolerate any liquids or secretions.  Was seen by Dr. Levon Hedger (GI) in the [facility] ER earlier this evening and had planned on doing emergent upper endoscopy.  He appropriately requested anesthesia support, but was told by Dr. Alva Garnet (Anesthesia) that the patient required higher level of anesthesia care due to history of difficult airway, recent iatrogenic pneumothorax, and comorbidities, and recommended patient transfer to [facility].  She now presents to [facility] for definitive management.  Separately, does report history of reflux, which was generally well controlled omeprazole 20 mg/day for years.  - CXR normal - Normal CBC and BMP  Past Medical History:  Diagnosis Date   DM (diabetes mellitus) (HCC)    GERD (gastroesophageal reflux disease)    Gout    Hyperlipidemia    Hypertension     Past Surgical History:  Procedure Laterality Date   REVERSE  SHOULDER ARTHROPLASTY Right 09/17/2020   Procedure: REVERSE SHOULDER ARTHROPLASTY;  Surgeon: Bjorn Pippin, MD;  Location: [facility];  Service: Orthopedics;  Laterality: Right;    Family History  Problem Relation Age of Onset   Heart disease Father      Social History   Tobacco Use   Smoking status: Former    Packs/day: 1.00    Years: 2.00    Pack years: 2.00    Types: Cigarettes    Quit date: 04/20/1975    Years since quitting: 45.5   Smokeless tobacco: Never  Substance Use Topics   Alcohol use: Never   Drug use: Never    Prior to Admission medications   Medication Sig Start Date End Date Taking? Authorizing Provider  ASPERCREME LIDOCAINE 4 % CREA Apply 1 application topically as needed (for pain).   Yes [provider]  colchicine-probenecid 0.5-500 MG tablet Take 1 tablet by mouth daily. 04/12/19  Yes [provider]  furosemide (LASIX) 40 MG tablet Take 40 mg by mouth 2 (two) times daily. 04/26/18  Yes [provider]  NOVOLOG FLEXPEN 100 UNIT/ML FlexPen Inject 16 Units into the skin See admin instructions. Inject 16 units into thee skin two times a day before meals, per sliding scale 03/13/18  Yes [provider]  omeprazole (PRILOSEC) 20 MG capsule Take 1 capsule (20 mg total) by mouth daily. To gastric protection while taking NSAIDs 09/17/20 11/07/20 Yes McBane, Jerald Kief, PA-C  polyethylene glycol (MIRALAX / GLYCOLAX) 17 g  packet Take 17 g by mouth daily. 09/19/20  Yes Kathlen Mody, MD  traMADol (ULTRAM) 50 MG tablet Take 50 mg by mouth 3 (three) times daily as needed. 10/12/20  Yes [provider]  TRESIBA FLEXTOUCH 200 UNIT/ML SOPN Inject 52 Units into the skin every evening. 03/13/18  Yes [provider]  atorvastatin (LIPITOR) 20 MG tablet Take 20 mg by mouth at bedtime. Patient not taking: No sig reported 05/05/19   [provider]  CONTOUR NEXT TEST test strip 4 (four) times daily. for testing  03/13/18   [provider]  docusate sodium (COLACE) 100 MG capsule Take 1 capsule (100 mg total) by mouth 2 (two) times daily. Patient not taking: No sig reported 09/19/20   Kathlen Mody, MD  GLOBAL EASE INJECT PEN NEEDLES 31G X 5 MM MISC 4 (four) times daily. 04/17/18   [provider]  JARDIANCE 25 MG TABS tablet Take 12.5 mg by mouth in the morning. Patient not taking: Reported on 11/07/2020    [provider]  omeprazole (PRILOSEC) 20 MG capsule Take 20 mg by mouth daily before breakfast. Patient not taking: Reported on 11/07/2020    [provider]  triamcinolone cream (KENALOG) 0.1 % Apply 1 application topically 3 (three) times daily as needed (for itching). Patient not taking: Reported on 11/07/2020 06/06/20   [provider]  Insulin Detemir (LEVEMIR FLEXTOUCH) 100 UNIT/ML Pen Levemir FlexTouch U-100 Insulin 100 unit/mL (3 mL) subcutaneous pen  05/03/18  [provider]    No current facility-administered medications for this encounter.   Current Outpatient Medications  Medication Sig Dispense Refill   ASPERCREME LIDOCAINE 4 % CREA Apply 1 application topically as needed (for pain).     colchicine-probenecid 0.5-500 MG tablet Take 1 tablet by mouth daily.     furosemide (LASIX) 40 MG tablet Take 40 mg by mouth 2 (two) times daily.     NOVOLOG FLEXPEN 100 UNIT/ML FlexPen Inject 16 Units into the skin See admin instructions. Inject 16 units into thee skin two times a day before meals, per sliding scale     omeprazole (PRILOSEC) 20 MG capsule Take 1 capsule (20 mg total) by mouth daily. To gastric protection while taking NSAIDs 30 capsule 0   polyethylene glycol (MIRALAX / GLYCOLAX) 17 g packet Take 17 g by mouth daily. 14 each 0   traMADol (ULTRAM) 50 MG tablet Take 50 mg by mouth 3 (three) times daily as needed.     TRESIBA FLEXTOUCH 200 UNIT/ML SOPN Inject 52 Units into the skin every evening.     atorvastatin (LIPITOR) 20 MG tablet  Take 20 mg by mouth at bedtime. (Patient not taking: No sig reported)     CONTOUR NEXT TEST test strip 4 (four) times daily. for testing     docusate sodium (COLACE) 100 MG capsule Take 1 capsule (100 mg total) by mouth 2 (two) times daily. (Patient not taking: No sig reported) 10 capsule 0   GLOBAL EASE INJECT PEN NEEDLES 31G X 5 MM MISC 4 (four) times daily.     JARDIANCE 25 MG TABS tablet Take 12.5 mg by mouth in the morning. (Patient not taking: Reported on 11/07/2020)     omeprazole (PRILOSEC) 20 MG capsule Take 20 mg by mouth daily before breakfast. (Patient not taking: Reported on 11/07/2020)     triamcinolone cream (KENALOG) 0.1 % Apply 1 application topically 3 (three) times daily as needed (for itching). (Patient not taking: Reported on 11/07/2020)  Allergies as of 11/07/2020 - Review Complete 11/07/2020  Allergen Reaction Noted   Lisinopril Diarrhea 10/24/2018   Metformin Diarrhea 01/21/2015     Review of Systems:    As per HPI, otherwise negative    Physical Exam:  Vital signs in last 24 hours: Temp:  [99.1 F (37.3 C)-99.3 F (37.4 C)] 99.1 F (37.3 C) (07/22 2125) Pulse Rate:  [78-92] 92 (07/22 2125) Resp:  [18-20] 18 (07/22 2125) BP: (159-183)/(70-80) 159/80 (07/22 2125) SpO2:  [97 %-98 %] 98 % (07/22 2125)   General:   Pleasant female in NAD.  Sitting upright and spitting into emesis bag Eyes:   No icterus.   Conjunctiva pink. Lungs:  Respirations even and unlabored. Lungs clear to auscultation bilaterally.   No wheezes, crackles, or rhonchi.  Heart:  Regular rate and rhythm; no MRG Abdomen:  Soft, nondistended, nontender. Normal bowel sounds. No appreciable masses or hepatomegaly.  Rectal:  Not performed.  Msk:  Symmetrical without gross deformities.  Extremities:  Without edema. Neurologic:  Alert and  oriented x4;  grossly normal neurologically. Skin:  Intact without significant lesions or rashes. Psych:  Alert and cooperative. Normal affect.  LAB  RESULTS: Recent Labs    11/07/20 1717  WBC 16.8*  HGB 12.6  HCT 40.1  PLT 396   BMET Recent Labs    11/07/20 1717  NA 138  K 3.7  CL 105  CO2 24  GLUCOSE 168*  BUN 29*  CREATININE 1.51*  CALCIUM 9.3   LFT No results for input(s): PROT, ALBUMIN, AST, ALT, ALKPHOS, BILITOT, BILIDIR, IBILI in the last 72 hours. PT/INR No results for input(s): LABPROT, INR in the last 72 hours.  STUDIES: DG Chest 1 View  Result Date: 11/07/2020 CLINICAL DATA:  70 year old female with difficulty swallowing. Patient choked on food earlier. EXAM: CHEST  1 VIEW COMPARISON:  Chest radiograph dated 09/18/2020. FINDINGS: No focal consolidation, pleural effusion, or pneumothorax. Stable cardiac silhouette. No acute osseous pathology. Right shoulder arthroplasty. IMPRESSION: No active disease. Electronically Signed   By: Elgie Collard M.D.   On: 11/07/2020 19:09     PREVIOUS ENDOSCOPIES:            - EGD: Patient unsure.  May have had EGD 15+ years ago in [place] but does not recall any details - Colonoscopy: 8 years ago, reportedly normal per patient   Impression / Plan:   1) Esophageal food impaction 2) Dysphagia 3) History of GERD - Plan for emergent EGD for food retrieval - Anesthesia service consulted due to elevated periprocedural risks     LOS: 0 days   Shellia Cleverly  11/07/2020, 10:32 PM

## 2020-11-07 NOTE — ED Triage Notes (Signed)
States she was eating bbq earlier and now she cannot swallow water , feels like it is stuck in throat

## 2020-11-07 NOTE — Op Note (Signed)
[facility] Patient Name: Sheila KaysSusan Halbig Procedure Date : 11/07/2020 MRN: [id number] Attending MD: Doristine LocksVito Mahin Guardia , MD Date of Birth: 06/21/1950 CSN: [id number] Age: 70 Admit Type: Outpatient Procedure:                Upper GI endoscopy Indications:              Dysphagia, Foreign body in the esophagus Providers:                Doristine LocksVito Laymond Postle, MD, Vicki MalletBrandy Grace, RN, Michele McalpineErik                            [place] Technician Referring MD:              Medicines:                General Anesthesia Complications:            No immediate complications. Estimated Blood Loss:     Estimated blood loss: none. Procedure:                Pre-Anesthesia Assessment:                           - Prior to the procedure, a History and Physical                            was performed, and patient medications and                            allergies were reviewed. The patient's tolerance of                            previous anesthesia was also reviewed. The risks                            and benefits of the procedure and the sedation                            options and risks were discussed with the patient.                            All questions were answered, and informed consent                            was obtained. Prior Anticoagulants: The patient has                            taken no previous anticoagulant or antiplatelet                            agents. ASA Grade Assessment: II - A patient with                            mild systemic disease. After reviewing the risks  and benefits, the patient was deemed in                            satisfactory condition to undergo the procedure.                           After obtaining informed consent, the endoscope was                            passed under direct vision. Throughout the                            procedure, the patient's blood pressure, pulse, and                            oxygen  saturations were monitored continuously. The                            [id number]-[id number] ([id number]) Olympus endoscope was introduced                            through the mouth, and advanced to the second part                            of duodenum. The upper GI endoscopy was                            accomplished without difficulty. The patient                            tolerated the procedure well. Scope In: Scope Out: Findings:      Food was found in the lower third of the esophagus. Using air       insufflation and gentle endoscopic pressure, this was carefully advanced       into the stomach.      A small hiatal hernia was present.      One benign-appearing, intrinsic moderate stenosis was found 35 cm from       the incisors. This stenosis measured 1 cm (in length). The stenosis was       traversed. The mucosa proximal to the stricture was erythematous       secondary to the above food impaction.      The entire examined stomach was normal.      The examined duodenum was normal. Impression:               - Food impaction in the lower third of the                            esophagus. This was advanced into the stomach using                            air insufflation and gentle endoscopic pressure.                           - Small hiatal hernia.                           -  Benign-appearing esophageal stenosis.                           - Normal stomach.                           - Normal examined duodenum.                           - No specimens collected. Recommendation:           - Admit the patient to hospital ward for overnight                            observation due to limited transportation.                           - Clear liquid diet tonight and tomorrow, then                            slowly advance to full liquids then soft foods. Do                            NOT advance beyon soft foods until repeat EGD with                            esophageal stricture dilation.                            - Continue present medications.                           - Use Protonix (pantoprazole) 40 mg PO BID for 8                            weeks.                           - Use sucralfate suspension 1 gram PO QID for 4                            weeks.                           - Repeat upper endoscopy in 4 weeks to check                            healing and for retreatment. This can be scheduled                            with myself or with Dr. Levon Hedger.                           - Return to GI office at appointment to be  scheduled. Procedure Code(s):        --- Professional ---                           [phone], Esophagogastroduodenoscopy, flexible,                            transoral; diagnostic, including collection of                            specimen(s) by brushing or washing, when performed                            (separate procedure) Diagnosis Code(s):        --- Professional ---                           X54.008Q, Food in esophagus causing other injury,                            initial encounter                           K44.9, Diaphragmatic hernia without obstruction or                            gangrene                           K22.2, Esophageal obstruction                           R13.10, Dysphagia, unspecified                           T18.108A, Unspecified foreign body in esophagus                            causing other injury, initial encounter CPT copyright 2019 [employer]. All rights reserved. The codes documented in this report are preliminary and upon coder review may  be revised to meet current compliance requirements. Doristine Locks, MD 11/07/2020 11:44:25 PM Number of Addenda: 0

## 2020-11-07 NOTE — Interval H&P Note (Signed)
History and Physical Interval Note:  11/07/2020 10:54 PM  Sheila [name]  has presented today for surgery, with the diagnosis of Esophageal food impaction.  The various methods of treatment have been discussed with the patient and family. After consideration of risks, benefits and other options for treatment, the patient has consented to  Procedure(s): ESOPHAGOGASTRODUODENOSCOPY (EGD) (N/A) FOREIGN BODY REMOVAL as a surgical intervention.  The patient's history has been reviewed, patient examined, no change in status, stable for surgery.  I have reviewed the patient's chart and labs.  Questions were answered to the patient's satisfaction.     Verlin Dike Shady Bradish

## 2020-11-07 NOTE — ED Provider Notes (Signed)
[facility] EMERGENCY DEPARTMENT Provider Note   CSN: [id number] Arrival date & time: 11/07/20  1604     History Chief Complaint  Patient presents with   food obstruction    Sheila [name] is a 70 y.o. female.  HPI  Patient with significant medical history of diabetes, gout, hypertension presents with chief complaint of a food bolus.  Patient states today around 1 PM she was eating some barbecue and it got stuck in her throat, she states since then she has been unable to swallow any water or swallow her own saliva.  She states she has vomited multiple times, states it is white and frothy in appearance.  She denies seeing any hematemesis.  She states that in the past she has been having some difficulty swallowing, states it generally gets stuck in her throat but after 30 minutes it going on its own.  She never had to have the food retrieved from her esophagus before.  She has no other complaints at this time.  She does not endorse shortness of breath, change in voice, tongue or throat, or chest pain.  Patient other complaints at this time.  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   Acute respiratory failure with hypoxia (HCC) 09/17/2020   Gout attack 11/08/2018   Edema 10/24/2018   Ankle pain 06/06/2017   Upper airway cough syndrome 12/16/2015   Abnormal WBC count 02/26/2015   Arthritis 01/22/2015   History of gout 01/22/2015   Mild intermittent asthma without complication 01/22/2015    Past Surgical History:  Procedure Laterality Date   REVERSE SHOULDER ARTHROPLASTY Right  09/17/2020   Procedure: REVERSE SHOULDER ARTHROPLASTY;  Surgeon: Bjorn Pippin, MD;  Location: [facility];  Service: Orthopedics;  Laterality: Right;     OB History   No obstetric history on file.     Family History  Problem Relation Age of Onset   Heart disease Father     Social History   Tobacco Use   Smoking status: Former    Packs/day: 1.00    Years: 2.00    Pack years: 2.00    Types: Cigarettes    Quit date: 04/20/1975    Years since quitting: 45.5   Smokeless tobacco: Never  Substance Use Topics   Alcohol use: Never   Drug use: Never    Home Medications Prior to Admission medications   Medication Sig Start Date End Date Taking? Authorizing Provider  ASPERCREME LIDOCAINE 4 % CREA Apply 1 application topically as needed (for pain).    [provider]  atorvastatin (LIPITOR) 20 MG tablet Take 20 mg by mouth at bedtime. Patient not taking: No sig reported 05/05/19   [provider]  colchicine-probenecid 0.5-500 MG tablet Take 1 tablet by mouth daily. 04/12/19   [provider]  CONTOUR NEXT TEST test strip 4 (four) times daily. for testing 03/13/18   [provider]  docusate sodium (COLACE) 100 MG capsule Take 1 capsule (100 mg total) by mouth 2 (two) times daily. 09/19/20   Kathlen Mody, MD  furosemide (LASIX) 40  MG tablet Take 40 mg by mouth 2 (two) times daily. 04/26/18   [provider]  GLOBAL EASE INJECT PEN NEEDLES 31G X 5 MM MISC 4 (four) times daily. 04/17/18   [provider]  JARDIANCE 25 MG TABS tablet Take 12.5 mg by mouth in the morning.    [provider]  NOVOLOG FLEXPEN 100 UNIT/ML FlexPen Inject 16 Units into the skin See admin instructions. Inject 16 units into thee skin two times a day before meals, per sliding scale 03/13/18   [provider]  omeprazole (PRILOSEC) 20 MG capsule Take 20 mg by mouth daily before breakfast.    [provider]  omeprazole  (PRILOSEC) 20 MG capsule Take 1 capsule (20 mg total) by mouth daily. To gastric protection while taking NSAIDs 09/17/20 10/17/20  McBane, Jerald Kiefaroline N, PA-C  polyethylene glycol (MIRALAX / GLYCOLAX) 17 g packet Take 17 g by mouth daily. 09/19/20   Kathlen ModyAkula, Vijaya, MD  TRESIBA FLEXTOUCH 200 UNIT/ML SOPN Inject 52 Units into the skin every evening. 03/13/18   [provider]  triamcinolone cream (KENALOG) 0.1 % Apply 1 application topically 3 (three) times daily as needed (for itching). 06/06/20   [provider]  Insulin Detemir (LEVEMIR FLEXTOUCH) 100 UNIT/ML Pen Levemir FlexTouch U-100 Insulin 100 unit/mL (3 mL) subcutaneous pen  05/03/18  [provider]    Allergies    Lisinopril and Metformin  Review of Systems   Review of Systems  Constitutional:  Negative for chills and fever.  HENT:  Positive for sore throat and trouble swallowing. Negative for congestion and voice change.   Respiratory:  Negative for shortness of breath.   Cardiovascular:  Negative for chest pain.  Gastrointestinal:  Positive for vomiting. Negative for abdominal pain.  Genitourinary:  Negative for enuresis.  Musculoskeletal:  Negative for back pain.  Skin:  Negative for rash.  Neurological:  Negative for dizziness.  Hematological:  Does not bruise/bleed easily.   Physical Exam Updated Vital Signs BP (!) 183/76 (BP Location: Left Arm)   Pulse 78   Temp 99.3 F (37.4 C)   Resp 18   SpO2 98%   Physical Exam Vitals and nursing note reviewed.  Constitutional:      General: She is not in acute distress.    Appearance: She is not ill-appearing.  HENT:     Head: Normocephalic and atraumatic.     Nose: No congestion.     Mouth/Throat:     Mouth: Mucous membranes are moist.     Pharynx: Oropharynx is clear. No oropharyngeal exudate or posterior oropharyngeal erythema.     Comments: Oropharynx is visualized tongue and uvula are both midline,  no noted food impaction present. Eyes:      Conjunctiva/sclera: Conjunctivae normal.  Cardiovascular:     Rate and Rhythm: Normal rate and regular rhythm.     Pulses: Normal pulses.     Heart sounds: No murmur heard.   No friction rub. No gallop.  Pulmonary:     Effort: No respiratory distress.     Breath sounds: No wheezing, rhonchi or rales.  Skin:    General: Skin is warm and dry.  Neurological:     Mental Status: She is alert.  Psychiatric:        Mood and Affect: Mood normal.    ED Results / Procedures / Treatments   Labs (all labs ordered are listed, but only abnormal results are displayed) Labs Reviewed  BASIC METABOLIC PANEL - Abnormal; Notable for  the following components:      Result Value   Glucose, Bld 168 (*)    BUN 29 (*)    Creatinine, Ser 1.51 (*)    GFR, Estimated 38 (*)    All other components within normal limits  CBC WITH DIFFERENTIAL/PLATELET - Abnormal; Notable for the following components:   WBC 16.8 (*)    RDW 15.6 (*)    Neutro Abs 12.6 (*)    Abs Immature Granulocytes 0.10 (*)    All other components within normal limits  RESP PANEL BY RT-PCR (FLU A&B, COVID) ARPGX2    EKG None  Radiology DG Chest 1 View  Result Date: 11/07/2020 CLINICAL DATA:  70 year old female with difficulty swallowing. Patient choked on food earlier. EXAM: CHEST  1 VIEW COMPARISON:  Chest radiograph dated 09/18/2020. FINDINGS: No focal consolidation, pleural effusion, or pneumothorax. Stable cardiac silhouette. No acute osseous pathology. Right shoulder arthroplasty. IMPRESSION: No active disease. Electronically Signed   By: Elgie Collard M.D.   On: 11/07/2020 19:09    Procedures Procedures   Medications Ordered in ED Medications  glucagon (human recombinant) (GLUCAGEN) injection 1 mg (1 mg Intramuscular Given 11/07/20 1717)    ED Course  I have reviewed the triage vital signs and the nursing notes.  Pertinent labs & imaging results that were available during my care of the patient were reviewed by me and  considered in my medical decision making (see chart for details).    MDM Rules/Calculators/A&P                          Initial impression-presents with likely food bolus, does not appear in acute distress, vital signs reassuring, will provide her with 1 milligram of glucagon and p.o. challenge.  will also obtain basic lab work-up.  Work-up-CBC shows leukocytosis 16.8, BMP shows hyperglycemia 168, BUN 29, creatinine 1.5, GFR 38.  Reassessment-patient was p.o. challenged unfortunately she failed vomiting up clear water immediately after swallowing it.  Suspect food bolus, will consult GI for further evaluation.  Consult-spoke with Dr. Holley Dexter GI he will come down and assessed the patient.  Notified by Dr. Levon Hedger who informed me that the anesthesiologist does not feel comfortable with this patient as she has a difficult airway as well as recent pneumothorax.  They would like this patient sent out for endoscopy.    Will consult with GI physician at [facility]  for further recommendations.  Spoke with Dr. Timoteo Ace of [facility] who will take the patient to the endoscopy suite.  Recommends ED to ED transfer.  Spoke with Dr. Arizona Constable at [facility] emergency department he has accepted the patient in transfer.  Rule out-I have low suspicion for systemic infection as patient is nontoxic-appearing, vital signs reassuring.  She does have leukocytosis but I feel this is more acute phase reactant due to the food bolus in her esophagus.  Low suspicion for anaphylactic shock as vital signs reassuring, she has no GI symptoms, no tongue or throat swelling, no systemic rash present.  Low suspicion for foreign body in the airway there is no noted wheezing or stridor heard on my exam.  Plan-ED to ED transfer for food bolus and likely endoscopy to [facility].  Final Clinical Impression(s) / ED Diagnoses Final diagnoses:  Food impaction of esophagus, initial encounter    Rx / DC Orders ED Discharge Orders      None        Carroll Sage, PA-C 11/07/20  2024    Bethann Berkshire, MD 11/10/20 1059

## 2020-11-08 ENCOUNTER — Telehealth: Payer: Self-pay | Admitting: Gastroenterology

## 2020-11-08 DIAGNOSIS — T18128A Food in esophagus causing other injury, initial encounter: Secondary | ICD-10-CM | POA: Diagnosis not present

## 2020-11-08 MED ORDER — ACETAMINOPHEN 10 MG/ML IV SOLN: 1000.0000 mg | Freq: Once | INTRAVENOUS | Status: DC | PRN

## 2020-11-08 MED ORDER — AMISULPRIDE (ANTIEMETIC) 5 MG/2ML IV SOLN: 10.0000 mg | Freq: Once | INTRAVENOUS | Status: DC | PRN

## 2020-11-08 MED ORDER — BUTAMBEN-TETRACAINE-BENZOCAINE 2-2-14 % EX AERO
1.0000 | INHALATION_SPRAY | CUTANEOUS | Status: DC | PRN
  Administered 2020-11-08: 1 via TOPICAL
  Filled 2020-11-08: qty 20

## 2020-11-08 MED ORDER — SUCRALFATE 1 GM/10ML PO SUSP: 1.0000 g | Freq: Four times a day (QID) | ORAL | 3 refills | Status: DC

## 2020-11-08 MED ORDER — FENTANYL CITRATE (PF) 100 MCG/2ML IJ SOLN: 25.0000 ug | INTRAMUSCULAR | Status: DC | PRN

## 2020-11-08 MED ORDER — PANTOPRAZOLE SODIUM 40 MG PO TBEC: 40.0000 mg | DELAYED_RELEASE_TABLET | Freq: Two times a day (BID) | ORAL | 3 refills | Status: DC

## 2020-11-08 MED ORDER — ONDANSETRON HCL 4 MG/2ML IJ SOLN: 4.0000 mg | Freq: Once | INTRAMUSCULAR | Status: DC | PRN

## 2020-11-08 NOTE — ED Notes (Signed)
Ambulated to bathroom with steady gait.  Husband en route ETA 0800

## 2020-11-08 NOTE — ED Notes (Signed)
Breakfast Ordered 

## 2020-11-08 NOTE — ED Notes (Signed)
Pt O2 dropped to 80% while pt was sleeping. When this RN woke pt up O2 came up to 90s - Pt placed on 2L [place] while pt is sleeping. Dr. Bebe Shaggy notified.

## 2020-11-08 NOTE — Anesthesia Postprocedure Evaluation (Signed)
Anesthesia Post Note  Patient: Fredda Clarida  Procedure(s) Performed: ESOPHAGOGASTRODUODENOSCOPY (EGD) FOREIGN BODY REMOVAL     Patient location during evaluation: PACU Anesthesia Type: General Level of consciousness: awake Pain management: pain level controlled Vital Signs Assessment: post-procedure vital signs reviewed and stable Respiratory status: spontaneous breathing, nonlabored ventilation, respiratory function stable and patient connected to nasal cannula oxygen Cardiovascular status: blood pressure returned to baseline and stable Postop Assessment: no apparent nausea or vomiting Anesthetic complications: no   No notable events documented.  Last Vitals:  Vitals:   11/08/20 0700 11/08/20 0758  BP: (!) 146/76 138/74  Pulse: 78 74  Resp: 15 16  Temp:  36.7 C  SpO2: 98% 99%    Last Pain:  Vitals:   11/08/20 0758  TempSrc: Oral  PainSc: 0-No pain                 Everlina Gotts P Candice Tobey

## 2020-11-08 NOTE — ED Provider Notes (Signed)
Patient has been transferred from [facility] for esophageal food obstruction.  She has underwent successful endoscopy to remove the obstruction.  She is awake and alert at this time, tolerated p.o. fluids.  She is awaiting transport home    Zadie Rhine, MD 11/08/20 [phone]

## 2020-11-08 NOTE — Telephone Encounter (Signed)
Placing Rx for discharge for the following: - Protonix 40 mg p.o. twice daily, x8 weeks, RF 3 - Carafate liquid 1 g before meals and at bedtime x4 weeks, RF 3  Can stop omeprazole.  We will arrange for follow-up with EGD with dilation to be done at [facility].

## 2020-11-08 NOTE — Progress Notes (Signed)
Patient received from endo.  VSS, no distress.  Husband updated via phone.  Husband stated that he is unable to drive at night.  Patient returned to ED 016 and report given to [facility].

## 2020-11-11 ENCOUNTER — Ambulatory Visit (HOSPITAL_COMMUNITY): Payer: Medicare Other

## 2020-11-11 ENCOUNTER — Other Ambulatory Visit (INDEPENDENT_AMBULATORY_CARE_PROVIDER_SITE_OTHER): Payer: Self-pay

## 2020-11-11 ENCOUNTER — Encounter (INDEPENDENT_AMBULATORY_CARE_PROVIDER_SITE_OTHER): Payer: Self-pay

## 2020-11-11 ENCOUNTER — Other Ambulatory Visit: Payer: Self-pay

## 2020-11-11 ENCOUNTER — Encounter (HOSPITAL_COMMUNITY): Payer: Self-pay | Admitting: Gastroenterology

## 2020-11-11 DIAGNOSIS — M25611 Stiffness of right shoulder, not elsewhere classified: Secondary | ICD-10-CM

## 2020-11-11 DIAGNOSIS — K222 Esophageal obstruction: Secondary | ICD-10-CM

## 2020-11-11 DIAGNOSIS — M6281 Muscle weakness (generalized): Secondary | ICD-10-CM | POA: Diagnosis not present

## 2020-11-11 DIAGNOSIS — R29898 Other symptoms and signs involving the musculoskeletal system: Secondary | ICD-10-CM

## 2020-11-11 DIAGNOSIS — M25511 Pain in right shoulder: Secondary | ICD-10-CM | POA: Diagnosis not present

## 2020-11-11 NOTE — Therapy (Signed)
[place], [place], [address] Phone: [phone]   Fax:  [phone]  Physical Therapy Treatment  Patient Details  Name: Sheila [name] MRN: [id number] Date of Birth: 1950/10/03 Referring Provider (PT): Ophelia Charter MD   Encounter Date: 11/11/2020   PT End of Session - 11/11/20 1400     Visit Number 12    Number of Visits 24    Date for PT Re-Evaluation 12/23/20    Authorization Type Primary: Medicare Secondary BCBS supplement (no auth)    Progress Note Due on Visit 20    PT Start Time 1345    PT Stop Time 1430    PT Time Calculation (min) 45 min    Activity Tolerance Patient tolerated treatment well    Behavior During Therapy [facility] for tasks assessed/performed             Past Medical History:  Diagnosis Date   DM (diabetes mellitus) ([place])    GERD (gastroesophageal reflux disease)    Gout    Hyperlipidemia    Hypertension     Past Surgical History:  Procedure Laterality Date   ESOPHAGOGASTRODUODENOSCOPY N/A 11/07/2020   Procedure: ESOPHAGOGASTRODUODENOSCOPY (EGD);  Surgeon: Lavena Bullion, DO;  Location: [facility] ENDOSCOPY;  Service: Gastroenterology;  Laterality: N/A;   FOREIGN BODY REMOVAL  11/07/2020   Procedure: FOREIGN BODY REMOVAL;  Surgeon: Lavena Bullion, DO;  Location: [place];  Service: Gastroenterology;;   REVERSE SHOULDER ARTHROPLASTY Right 09/17/2020   Procedure: REVERSE SHOULDER ARTHROPLASTY;  Surgeon: Hiram Gash, MD;  Location: [place];  Service: Orthopedics;  Laterality: Right;    There were no vitals filed for this visit.   Subjective Assessment - 11/11/20 1358     Subjective Shoulder is doing well    Currently in Pain? No/denies    Pain Score 0-No pain                [facility] PT Assessment - 11/11/20 0001       Assessment   Medical Diagnosis R reverse total shoulder arthoplasty    Referring Provider (PT) Ophelia Charter MD    Onset Date/Surgical Date  09/17/20    Next MD Visit 12/11/20      Precautions   Precautions Shoulder    Type of Shoulder Precautions Reverse total shoulder                           [facility] Adult PT Treatment/Exercise - 11/11/20 0001       Shoulder Exercises: Seated   Row Strengthening;Both    Theraband Level (Shoulder Row) Level 2 (Red)    Row Limitations 3x10, Rt. shoulder to neutral extension point    Other Seated Exercises bicep curl 3x10 4#    Other Seated Exercises tricep extension red band 3x10      Shoulder Exercises: Pulleys   Flexion 2 minutes      Shoulder Exercises: ROM/Strengthening   UBE (Upper Arm Bike) level 1 x 6 min for dynamic warm-up      Shoulder Exercises: Body Blade   Other Body Blade Exercises depression/elevation 4x30 sec    Other Body Blade Exercises protraction/retraction 4x30 sec                      PT Short Term Goals - 10/30/20 1403       PT SHORT TERM GOAL #1   Title Patient will be independent  with HEP in order to improve functional outcomes.    Baseline 10/30/20:  Reports compliance with HEP daily.    Status On-going      PT SHORT TERM GOAL #2   Title Patient will demonstrate R shoulder ROM 90 degrees flexion, 15 ER to show improving ROM per protocol.    Baseline 10/30/20:  Flexion at 112, Abd 92, ER at 35    Status Achieved      PT SHORT TERM GOAL #3   Title Patient will demonstrate R shoulder ROM 120 degrees flexion, 30 ER to show improving ROM per protocol.    Baseline 10/30/20:  Flexion at 112, Abd 92, ER at 35    Status Partially Met               PT Long Term Goals - 10/30/20 1408       PT LONG TERM GOAL #1   Title Patient will report at least 75% improvement in symptoms for improved quality of life.    Baseline 10/30/20: reports improvements by 70%    Status On-going      PT LONG TERM GOAL #2   Title Patient will improve FOTO score by at least 60 points in order to indicate improved tolerance to activity.     Baseline 10/30/20:  FOTO 62.24% functional    Status Achieved      PT LONG TERM GOAL #3   Title Patient will demonstrate at least 4+/5  R shoulder MMT (except extension and IR per protocol) in order to improve ability to lift overhead to a cabinet.    Status On-going                   Plan - 11/11/20 1429     Clinical Impression Statement Tolerating progression of ther ex very well without shoulder pain or instability. Able to initiate dynamic strengthening albeit with extension limited to neutral per protocol. Continued sessions indicated to progress PRE to improve right shoulder function    Personal Factors and Comorbidities Comorbidity 3+;Fitness;Behavior Pattern;Past/Current Experience;Time since onset of injury/illness/exacerbation    Comorbidities Arthritis, BMI over 30, Diabetes, Osteoporosis    Examination-Activity Limitations Bathing;Bed Mobility;Bend;Dressing;Hygiene/Grooming;Lift;Reach Overhead;Sleep    Examination-Participation Restrictions Meal Prep;Cleaning;Community Activity;Driving;Laundry    Stability/Clinical Decision Making Stable/Uncomplicated    Rehab Potential Good    PT Frequency 2x / week    PT Duration 12 weeks    PT Treatment/Interventions ADLs/Self Care Home Management;Cryotherapy;Electrical Stimulation;Iontophoresis 22m/ml Dexamethasone;Moist Heat;Traction;DME Instruction;Gait training;Stair training;Functional mobility training;Therapeutic activities;Therapeutic exercise;Balance training;Neuromuscular re-education;Patient/family education;Manual techniques;Manual lymph drainage;Compression bandaging;Scar mobilization;Passive range of motion;Dry needling;Energy conservation;Splinting;Taping;Vasopneumatic Device    PT Next Visit Plan Complete MMT next session.  Continue ROM based exercises, add wall washing.  Progress shoulder exercises in concertic control only.  Avoid IR/externsion. Protocol in media and on PT desk and in book, begin PROM per protocol and  progress to AAROM/ AROM as able, shoulder flex, abd, ER iso in neutral,    PT Home Exercise Plan 6/14 elbow flex/ext, wrist flex/ext, grip 6/16: table slides, flexion isometric; 10/30/20: isometric against wall flexion, abd and ER    Consulted and Agree with Plan of Care Patient             Patient will benefit from skilled therapeutic intervention in order to improve the following deficits and impairments:  Decreased range of motion, Decreased endurance, Increased muscle spasms, Impaired UE functional use, Pain, Decreased skin integrity, Decreased activity tolerance, Impaired flexibility, Improper body mechanics, Decreased strength, Decreased mobility  Visit Diagnosis: Right shoulder pain, unspecified chronicity  Stiffness of right shoulder, not elsewhere classified  Muscle weakness (generalized)  Other symptoms and signs involving the musculoskeletal system     Problem List Patient Active Problem List   Diagnosis Date Noted   Food impaction of esophagus    Hiatal hernia    Benign esophageal stricture    Postprocedural pneumothorax 09/17/2020   Diabetes mellitus type 2 in obese ([place]) 09/17/2020   GERD (gastroesophageal reflux disease) 09/17/2020   Leukocytosis 09/17/2020   S/P reverse total shoulder arthroplasty, right 09/17/2020   Acute respiratory failure with hypoxia ([place]) 09/17/2020   Gout attack 11/08/2018   Edema 10/24/2018   Ankle pain 06/06/2017   Upper airway cough syndrome 12/16/2015   Abnormal WBC count 02/26/2015   Arthritis 01/22/2015   History of gout 01/22/2015   Mild intermittent asthma without complication 26/37/8588   2:41 PM, 11/11/20 M. Sherlyn Lees, PT, DPT Physical Therapist- [place] Office Number: [phone]   [place] [address] [place], [place], [address] Phone: [phone]   Fax:  [phone]  Name: Kherington Meraz MRN: [id number] Date of Birth: [birth year]

## 2020-11-13 ENCOUNTER — Ambulatory Visit (HOSPITAL_COMMUNITY): Payer: Medicare Other

## 2020-11-14 DIAGNOSIS — I10 Essential (primary) hypertension: Secondary | ICD-10-CM | POA: Diagnosis not present

## 2020-11-14 DIAGNOSIS — E119 Type 2 diabetes mellitus without complications: Secondary | ICD-10-CM | POA: Diagnosis not present

## 2020-11-14 DIAGNOSIS — Z299 Encounter for prophylactic measures, unspecified: Secondary | ICD-10-CM | POA: Diagnosis not present

## 2020-11-14 DIAGNOSIS — R1310 Dysphagia, unspecified: Secondary | ICD-10-CM | POA: Diagnosis not present

## 2020-11-17 ENCOUNTER — Encounter (INDEPENDENT_AMBULATORY_CARE_PROVIDER_SITE_OTHER): Payer: Self-pay

## 2020-11-18 ENCOUNTER — Ambulatory Visit (HOSPITAL_COMMUNITY): Payer: Medicare Other | Attending: Orthopaedic Surgery

## 2020-11-18 ENCOUNTER — Other Ambulatory Visit: Payer: Self-pay

## 2020-11-18 DIAGNOSIS — R29898 Other symptoms and signs involving the musculoskeletal system: Secondary | ICD-10-CM | POA: Diagnosis not present

## 2020-11-18 DIAGNOSIS — M25511 Pain in right shoulder: Secondary | ICD-10-CM | POA: Diagnosis not present

## 2020-11-18 DIAGNOSIS — M6281 Muscle weakness (generalized): Secondary | ICD-10-CM | POA: Diagnosis not present

## 2020-11-18 DIAGNOSIS — M25611 Stiffness of right shoulder, not elsewhere classified: Secondary | ICD-10-CM | POA: Insufficient documentation

## 2020-11-18 NOTE — Patient Instructions (Signed)
Access Code: [email or web address] URL: [email or web address] Date: 11/18/2020 Prepared by: Shary Decamp  Exercises Seated Shoulder Abduction AAROM with Dowel - 1 x daily - 7 x weekly - 3 sets - 10 reps

## 2020-11-18 NOTE — Therapy (Signed)
[place], [place], [address] Phone: [phone]   Fax:  [phone]  Physical Therapy Treatment  Patient Details  Name: Sheila [name] MRN: [id number] Date of Birth: Dec 14, 1950 Referring Provider (PT): Ophelia Charter MD   Encounter Date: 11/18/2020   PT End of Session - 11/18/20 1344     Visit Number 13    Number of Visits 24    Date for PT Re-Evaluation 12/23/20    Authorization Type Primary: Medicare Secondary BCBS supplement (no auth)    Progress Note Due on Visit 20    PT Start Time 1345    PT Stop Time 1430    PT Time Calculation (min) 45 min    Activity Tolerance Patient tolerated treatment well    Behavior During Therapy [facility] for tasks assessed/performed             Past Medical History:  Diagnosis Date   DM (diabetes mellitus) ([place])    GERD (gastroesophageal reflux disease)    Gout    Hyperlipidemia    Hypertension     Past Surgical History:  Procedure Laterality Date   ESOPHAGOGASTRODUODENOSCOPY N/A 11/07/2020   Procedure: ESOPHAGOGASTRODUODENOSCOPY (EGD);  Surgeon: Lavena Bullion, DO;  Location: [facility] ENDOSCOPY;  Service: Gastroenterology;  Laterality: N/A;   FOREIGN BODY REMOVAL  11/07/2020   Procedure: FOREIGN BODY REMOVAL;  Surgeon: Lavena Bullion, DO;  Location: [place];  Service: Gastroenterology;;   REVERSE SHOULDER ARTHROPLASTY Right 09/17/2020   Procedure: REVERSE SHOULDER ARTHROPLASTY;  Surgeon: Hiram Gash, MD;  Location: [place];  Service: Orthopedics;  Laterality: Right;    There were no vitals filed for this visit.   Subjective Assessment - 11/18/20 1343     Subjective Shoulder is feeling good. No issues noted and feeling better with use of RUE    Limitations Lifting;House hold activities                [facility] PT Assessment - 11/18/20 0001       Assessment   Medical Diagnosis R reverse total shoulder arthoplasty    Referring Provider (PT)  Ophelia Charter MD    Onset Date/Surgical Date 09/17/20    Next MD Visit 12/11/20      Precautions   Precautions Shoulder    Type of Shoulder Precautions Reverse total shoulder                           [facility] Adult PT Treatment/Exercise - 11/18/20 0001       Shoulder Exercises: Seated   Row Strengthening;Right    Theraband Level (Shoulder Row) Level 2 (Red)    Row Limitations 3x10, Rt. shoulder to neutral extension point    External Rotation Strengthening;Right;15 reps    Theraband Level (Shoulder External Rotation) Level 2 (Red)    External Rotation Limitations isometric hold while therapist performs perturbations x 10 sec    Flexion AROM;20 reps    Flexion Limitations with PVC pop    Abduction AAROM;Right;20 reps    ABduction Limitations with PVC permed as eccentric for right shoulder, using pipe to perform concentric phase and RUE performs eccentric lowering.    Other Seated Exercises hammer curl 3x10 4#    Other Seated Exercises tricep extension red band 3x10      Shoulder Exercises: Pulleys   Flexion 2 minutes      Shoulder Exercises: ROM/Strengthening   UBE (Upper Arm Bike) level 1  x 3 min for dynamic warm-up                      PT Short Term Goals - 10/30/20 1403       PT SHORT TERM GOAL #1   Title Patient will be independent with HEP in order to improve functional outcomes.    Baseline 10/30/20:  Reports compliance with HEP daily.    Status On-going      PT SHORT TERM GOAL #2   Title Patient will demonstrate R shoulder ROM 90 degrees flexion, 15 ER to show improving ROM per protocol.    Baseline 10/30/20:  Flexion at 112, Abd 92, ER at 35    Status Achieved      PT SHORT TERM GOAL #3   Title Patient will demonstrate R shoulder ROM 120 degrees flexion, 30 ER to show improving ROM per protocol.    Baseline 10/30/20:  Flexion at 112, Abd 92, ER at 35    Status Partially Met               PT Long Term Goals - 10/30/20 1408        PT LONG TERM GOAL #1   Title Patient will report at least 75% improvement in symptoms for improved quality of life.    Baseline 10/30/20: reports improvements by 70%    Status On-going      PT LONG TERM GOAL #2   Title Patient will improve FOTO score by at least 60 points in order to indicate improved tolerance to activity.    Baseline 10/30/20:  FOTO 62.24% functional    Status Achieved      PT LONG TERM GOAL #3   Title Patient will demonstrate at least 4+/5  R shoulder MMT (except extension and IR per protocol) in order to improve ability to lift overhead to a cabinet.    Status On-going                   Plan - 11/18/20 1426     Clinical Impression Statement Tolerating progression of right shoulder AROM against gravity and improving control of range/strength adn reports ability to use BUE to wash hair at this time. Pt is 8 weeks post-op now and progressing with strength development in RUE. Continued sessions indicated to progress AROM and strength RUE to improve function    Personal Factors and Comorbidities Comorbidity 3+;Fitness;Behavior Pattern;Past/Current Experience;Time since onset of injury/illness/exacerbation    Comorbidities Arthritis, BMI over 30, Diabetes, Osteoporosis    Examination-Activity Limitations Bathing;Bed Mobility;Bend;Dressing;Hygiene/Grooming;Lift;Reach Overhead;Sleep    Examination-Participation Restrictions Meal Prep;Cleaning;Community Activity;Driving;Laundry    Stability/Clinical Decision Making Stable/Uncomplicated    Rehab Potential Good    PT Frequency 2x / week    PT Duration 12 weeks    PT Treatment/Interventions ADLs/Self Care Home Management;Cryotherapy;Electrical Stimulation;Iontophoresis 21m/ml Dexamethasone;Moist Heat;Traction;DME Instruction;Gait training;Stair training;Functional mobility training;Therapeutic activities;Therapeutic exercise;Balance training;Neuromuscular re-education;Patient/family education;Manual techniques;Manual lymph  drainage;Compression bandaging;Scar mobilization;Passive range of motion;Dry needling;Energy conservation;Splinting;Taping;Vasopneumatic Device    PT Next Visit Plan Complete MMT next session.  Continue ROM based exercises, add wall washing.  Progress shoulder exercises in concertic control only.  Avoid IR/externsion. Protocol in media and on PT desk and in book, begin PROM per protocol and progress to AAROM/ AROM as able, shoulder flex, abd, ER iso in neutral,    PT Home Exercise Plan 6/14 elbow flex/ext, wrist flex/ext, grip 6/16: table slides, flexion isometric; 10/30/20: isometric against wall flexion, abd and ER    Consulted  and Agree with Plan of Care Patient             Patient will benefit from skilled therapeutic intervention in order to improve the following deficits and impairments:  Decreased range of motion, Decreased endurance, Increased muscle spasms, Impaired UE functional use, Pain, Decreased skin integrity, Decreased activity tolerance, Impaired flexibility, Improper body mechanics, Decreased strength, Decreased mobility  Visit Diagnosis: Right shoulder pain, unspecified chronicity  Stiffness of right shoulder, not elsewhere classified  Muscle weakness (generalized)  Other symptoms and signs involving the musculoskeletal system     Problem List Patient Active Problem List   Diagnosis Date Noted   Food impaction of esophagus    Hiatal hernia    Benign esophageal stricture    Postprocedural pneumothorax 09/17/2020   Diabetes mellitus type 2 in obese ([place]) 09/17/2020   GERD (gastroesophageal reflux disease) 09/17/2020   Leukocytosis 09/17/2020   S/P reverse total shoulder arthroplasty, right 09/17/2020   Acute respiratory failure with hypoxia ([facility]) 09/17/2020   Gout attack 11/08/2018   Edema 10/24/2018   Ankle pain 06/06/2017   Upper airway cough syndrome 12/16/2015   Abnormal WBC count 02/26/2015   Arthritis 01/22/2015   History of gout 01/22/2015   Mild  intermittent asthma without complication 91/69/4503    Toniann Fail 11/18/2020, 2:31 PM  [place], [place], [address] Phone: [phone]   Fax:  [phone]  Name: Sheila [name] MRN: [id number] Date of Birth: 1951/03/16

## 2020-11-20 ENCOUNTER — Encounter (HOSPITAL_COMMUNITY): Payer: Self-pay | Admitting: Gastroenterology

## 2020-11-20 ENCOUNTER — Ambulatory Visit (HOSPITAL_COMMUNITY): Payer: Medicare Other | Admitting: Physical Therapy

## 2020-11-20 ENCOUNTER — Other Ambulatory Visit: Payer: Self-pay

## 2020-11-20 DIAGNOSIS — M25611 Stiffness of right shoulder, not elsewhere classified: Secondary | ICD-10-CM | POA: Diagnosis not present

## 2020-11-20 DIAGNOSIS — R29898 Other symptoms and signs involving the musculoskeletal system: Secondary | ICD-10-CM

## 2020-11-20 DIAGNOSIS — M25511 Pain in right shoulder: Secondary | ICD-10-CM

## 2020-11-20 DIAGNOSIS — M6281 Muscle weakness (generalized): Secondary | ICD-10-CM | POA: Diagnosis not present

## 2020-11-20 NOTE — Therapy (Signed)
[place], [place], [address] Phone: [phone]   Fax:  [phone]  Physical Therapy Treatment  Patient Details  Name: Sheila [name] MRN: [id number] Date of Birth: Jan 02, 1951 Referring Provider (PT): Ophelia Charter MD   Encounter Date: 11/20/2020   PT End of Session - 11/20/20 1310     Visit Number 14    Number of Visits 24    Date for PT Re-Evaluation 12/23/20    Authorization Type Primary: Medicare Secondary BCBS supplement (no auth)    Progress Note Due on Visit 20    PT Start Time 1312    PT Stop Time 1350    PT Time Calculation (min) 38 min    Activity Tolerance Patient tolerated treatment well    Behavior During Therapy [facility] for tasks assessed/performed             Past Medical History:  Diagnosis Date   DM (diabetes mellitus) ([place])    GERD (gastroesophageal reflux disease)    Gout    Hyperlipidemia    Hypertension     Past Surgical History:  Procedure Laterality Date   ESOPHAGOGASTRODUODENOSCOPY N/A 11/07/2020   Procedure: ESOPHAGOGASTRODUODENOSCOPY (EGD);  Surgeon: Lavena Bullion, DO;  Location: [facility] ENDOSCOPY;  Service: Gastroenterology;  Laterality: N/A;   FOREIGN BODY REMOVAL  11/07/2020   Procedure: FOREIGN BODY REMOVAL;  Surgeon: Lavena Bullion, DO;  Location: [place];  Service: Gastroenterology;;   REVERSE SHOULDER ARTHROPLASTY Right 09/17/2020   Procedure: REVERSE SHOULDER ARTHROPLASTY;  Surgeon: Hiram Gash, MD;  Location: [place];  Service: Orthopedics;  Laterality: Right;    There were no vitals filed for this visit.   Subjective Assessment - 11/20/20 1311     Subjective Patient states her shoulder has been doing good. HEP is going well.    Currently in Pain? No/denies                [facility] PT Assessment - 11/20/20 0001       AROM   AROM Assessment Site Shoulder    Right/Left Shoulder Right;Left      Strength   Overall Strength Comments  good strength in limited range RUE    Strength Assessment Site Shoulder    Right/Left Shoulder Right;Left    Right Shoulder Flexion 3-/5    Right Shoulder ABduction 3-/5    Right Shoulder External Rotation 3-/5    Left Shoulder Flexion 5/5    Left Shoulder ABduction 5/5    Left Shoulder Internal Rotation 5/5    Left Shoulder External Rotation 5/5                           [facility] Adult PT Treatment/Exercise - 11/20/20 0001       Shoulder Exercises: Seated   Row Strengthening;Right    Theraband Level (Shoulder Row) Level 3 (Green)    Row Limitations 3x10, Rt. shoulder to neutral extension point    Flexion AROM;20 reps    Flexion Limitations with PVC pipe    Abduction AAROM;Right;20 reps    ABduction Limitations with PVC permed as eccentric for right shoulder, using pipe to perform concentric phase and RUE performs eccentric lowering.    Other Seated Exercises bicep curl 3x10 4#      Shoulder Exercises: Standing   Other Standing Exercises wall walks flexion and abduction x 10 each      Shoulder Exercises: Pulleys   Flexion 2  minutes      Shoulder Exercises: ROM/Strengthening   UBE (Upper Arm Bike) level 1 x 4 min for dynamic warm-up                    PT Education - 11/20/20 1311     Education Details HEP    Person(s) Educated Patient    Methods Explanation    Comprehension Verbalized understanding              PT Short Term Goals - 10/30/20 1403       PT SHORT TERM GOAL #1   Title Patient will be independent with HEP in order to improve functional outcomes.    Baseline 10/30/20:  Reports compliance with HEP daily.    Status On-going      PT SHORT TERM GOAL #2   Title Patient will demonstrate R shoulder ROM 90 degrees flexion, 15 ER to show improving ROM per protocol.    Baseline 10/30/20:  Flexion at 112, Abd 92, ER at 35    Status Achieved      PT SHORT TERM GOAL #3   Title Patient will demonstrate R shoulder ROM 120 degrees  flexion, 30 ER to show improving ROM per protocol.    Baseline 10/30/20:  Flexion at 112, Abd 92, ER at 35    Status Partially Met               PT Long Term Goals - 10/30/20 1408       PT LONG TERM GOAL #1   Title Patient will report at least 75% improvement in symptoms for improved quality of life.    Baseline 10/30/20: reports improvements by 70%    Status On-going      PT LONG TERM GOAL #2   Title Patient will improve FOTO score by at least 60 points in order to indicate improved tolerance to activity.    Baseline 10/30/20:  FOTO 62.24% functional    Status Achieved      PT LONG TERM GOAL #3   Title Patient will demonstrate at least 4+/5  R shoulder MMT (except extension and IR per protocol) in order to improve ability to lift overhead to a cabinet.    Status On-going                   Plan - 11/20/20 1310     Clinical Impression Statement Patient begins session with UBE and pullies for dynamic warm up and ROM. Patient continues to demonstrate impaired R shoulder strength with compensatory strategies used when lifting against gravity. Patient does show good strength below approx. 70 degrees. Patient able to complete increased resistance with rows today. Patient with good overhead ROM with wall walk exercise but notes fatigue following. Patient overall progressing well. Patient will continue to benefit from skilled physical therapy in order to reduce impairment and improve function.    Personal Factors and Comorbidities Comorbidity 3+;Fitness;Behavior Pattern;Past/Current Experience;Time since onset of injury/illness/exacerbation    Comorbidities Arthritis, BMI over 30, Diabetes, Osteoporosis    Examination-Activity Limitations Bathing;Bed Mobility;Bend;Dressing;Hygiene/Grooming;Lift;Reach Overhead;Sleep    Examination-Participation Restrictions Meal Prep;Cleaning;Community Activity;Driving;Laundry    Stability/Clinical Decision Making Stable/Uncomplicated    Rehab  Potential Good    PT Frequency 2x / week    PT Duration 12 weeks    PT Treatment/Interventions ADLs/Self Care Home Management;Cryotherapy;Electrical Stimulation;Iontophoresis 61m/ml Dexamethasone;Moist Heat;Traction;DME Instruction;Gait training;Stair training;Functional mobility training;Therapeutic activities;Therapeutic exercise;Balance training;Neuromuscular re-education;Patient/family education;Manual techniques;Manual lymph drainage;Compression bandaging;Scar mobilization;Passive range of motion;Dry needling;Energy conservation;Splinting;Taping;Vasopneumatic Device  PT Next Visit Plan Continue ROM based exercises, add wall washing.  Progress shoulder exercises in concertic control only.  Avoid IR/externsion. Protocol in media and on PT desk and in book, begin PROM per protocol and progress to AAROM/ AROM as able, shoulder flex, abd, ER iso in neutral,    PT Home Exercise Plan 6/14 elbow flex/ext, wrist flex/ext, grip 6/16: table slides, flexion isometric; 10/30/20: isometric against wall flexion, abd and ER    Consulted and Agree with Plan of Care Patient             Patient will benefit from skilled therapeutic intervention in order to improve the following deficits and impairments:  Decreased range of motion, Decreased endurance, Increased muscle spasms, Impaired UE functional use, Pain, Decreased skin integrity, Decreased activity tolerance, Impaired flexibility, Improper body mechanics, Decreased strength, Decreased mobility  Visit Diagnosis: Right shoulder pain, unspecified chronicity  Stiffness of right shoulder, not elsewhere classified  Muscle weakness (generalized)  Other symptoms and signs involving the musculoskeletal system     Problem List Patient Active Problem List   Diagnosis Date Noted   Food impaction of esophagus    Hiatal hernia    Benign esophageal stricture    Postprocedural pneumothorax 09/17/2020   Diabetes mellitus type 2 in obese ([place]) 09/17/2020    GERD (gastroesophageal reflux disease) 09/17/2020   Leukocytosis 09/17/2020   S/P reverse total shoulder arthroplasty, right 09/17/2020   Acute respiratory failure with hypoxia ([place]) 09/17/2020   Gout attack 11/08/2018   Edema 10/24/2018   Ankle pain 06/06/2017   Upper airway cough syndrome 12/16/2015   Abnormal WBC count 02/26/2015   Arthritis 01/22/2015   History of gout 01/22/2015   Mild intermittent asthma without complication 70/96/2836    1:48 PM, 11/20/20 Mearl Latin PT, DPT Physical Therapist at [place], [place], [address] Phone: [phone]   Fax:  [phone]  Name: Sheila [name] MRN: [id number] Date of Birth: Dec 26, 1950

## 2020-11-25 ENCOUNTER — Encounter (HOSPITAL_COMMUNITY): Payer: Self-pay

## 2020-11-25 ENCOUNTER — Other Ambulatory Visit: Payer: Self-pay

## 2020-11-25 ENCOUNTER — Ambulatory Visit (HOSPITAL_COMMUNITY): Payer: Medicare Other

## 2020-11-25 VITALS — HR 81

## 2020-11-25 DIAGNOSIS — M6281 Muscle weakness (generalized): Secondary | ICD-10-CM

## 2020-11-25 DIAGNOSIS — M25511 Pain in right shoulder: Secondary | ICD-10-CM

## 2020-11-25 DIAGNOSIS — R29898 Other symptoms and signs involving the musculoskeletal system: Secondary | ICD-10-CM | POA: Diagnosis not present

## 2020-11-25 DIAGNOSIS — M25611 Stiffness of right shoulder, not elsewhere classified: Secondary | ICD-10-CM | POA: Diagnosis not present

## 2020-11-25 NOTE — Therapy (Signed)
[place], [place], [address] Phone: [phone]   Fax:  [phone]  Physical Therapy Treatment  Patient Details  Name: Sheila [name] MRN: [id number] Date of Birth: 06-Mar-1951 Referring Provider (PT): Ophelia Charter MD   Encounter Date: 11/25/2020   PT End of Session - 11/25/20 1319     Visit Number 15    Number of Visits 24    Date for PT Re-Evaluation 12/23/20    Authorization Type Primary: Medicare Secondary BCBS supplement (no auth)    Progress Note Due on Visit 20    PT Start Time 1312    PT Stop Time 1358    PT Time Calculation (min) 46 min    Activity Tolerance Patient tolerated treatment well    Behavior During Therapy [facility] for tasks assessed/performed             Past Medical History:  Diagnosis Date   DM (diabetes mellitus) ([place])    GERD (gastroesophageal reflux disease)    Gout    Hyperlipidemia    Hypertension     Past Surgical History:  Procedure Laterality Date   ESOPHAGOGASTRODUODENOSCOPY N/A 11/07/2020   Procedure: ESOPHAGOGASTRODUODENOSCOPY (EGD);  Surgeon: Lavena Bullion, DO;  [facility] ENDOSCOPY;  Service: Gastroenterology;  Laterality: N/A;   FOREIGN BODY REMOVAL  11/07/2020   Procedure: FOREIGN BODY REMOVAL;  Surgeon: Lavena Bullion, DO;  Location: [place];  Service: Gastroenterology;;   REVERSE SHOULDER ARTHROPLASTY Right 09/17/2020   Procedure: REVERSE SHOULDER ARTHROPLASTY;  Surgeon: Hiram Gash, MD;  Location: [place];  Service: Orthopedics;  Laterality: Right;    Vitals:   11/25/20 1404  Pulse: 81  SpO2: 97%     Subjective Assessment - 11/25/20 1318     Subjective Pt stated she is stiff this morning, stated her Lt shoulder is bothering her a little bit today.  Has taken a pain pill for Lt shoulder prior therapy today, no reports of currently.    Patient Stated Goals Improve arm mobility    Currently in Pain? No/denies                                [facility] Adult PT Treatment/Exercise - 11/25/20 0001       Shoulder Exercises: Seated   Row Strengthening;Both;20 reps    Theraband Level (Shoulder Row) Level 3 (Green)    Row Limitations 3x10, Rt. shoulder to neutral extension point    Flexion AROM;20 reps    Flexion Limitations with PVC pipe    Abduction AAROM;Right;20 reps    ABduction Limitations with PVC permed as eccentric for right shoulder, using pipe to perform concentric phase and RUE performs eccentric lowering.    Other Seated Exercises throw/catch fast with green weighted ball 2x 10      Shoulder Exercises: Standing   Other Standing Exercises wall wash 2x 30" flexion then horizontal abduction overhead; 10 cone first shelf, 5 cones 2nd shelf    Other Standing Exercises wall walks flexion and abduction x 10 each; able to make it to opening #10      Shoulder Exercises: Therapy Ball   Flexion Both;10 reps    ABduction Right;10 reps      Shoulder Exercises: ROM/Strengthening   UBE (Upper Arm Bike) 3 min forward then 3 min backwards L1 resistance      Manual Therapy   Manual Therapy Passive ROM  Manual therapy comments Completed separate from all other activity    Passive ROM Rt GHJ PROM (flex/ abduction, ER) per protocol                      PT Short Term Goals - 10/30/20 1403       PT SHORT TERM GOAL #1   Title Patient will be independent with HEP in order to improve functional outcomes.    Baseline 10/30/20:  Reports compliance with HEP daily.    Status On-going      PT SHORT TERM GOAL #2   Title Patient will demonstrate R shoulder ROM 90 degrees flexion, 15 ER to show improving ROM per protocol.    Baseline 10/30/20:  Flexion at 112, Abd 92, ER at 35    Status Achieved      PT SHORT TERM GOAL #3   Title Patient will demonstrate R shoulder ROM 120 degrees flexion, 30 ER to show improving ROM per protocol.    Baseline 10/30/20:  Flexion at 112, Abd 92, ER at 35     Status Partially Met               PT Long Term Goals - 10/30/20 1408       PT LONG TERM GOAL #1   Title Patient will report at least 75% improvement in symptoms for improved quality of life.    Baseline 10/30/20: reports improvements by 70%    Status On-going      PT LONG TERM GOAL #2   Title Patient will improve FOTO score by at least 60 points in order to indicate improved tolerance to activity.    Baseline 10/30/20:  FOTO 62.24% functional    Status Achieved      PT LONG TERM GOAL #3   Title Patient will demonstrate at least 4+/5  R shoulder MMT (except extension and IR per protocol) in order to improve ability to lift overhead to a cabinet.    Status On-going                   Plan - 11/25/20 1402     Clinical Impression Statement Pt limited by fatigue and reports of increased SOB with overhead activities.  Vitals taken WNL.  Added wall washing and cone placement onto shelves for ROM and strengthening.  Pt continues to be limited with ROM, cueing to reduce compensation with trunk sidebending and increased shoulder elevation with activities.  No reports of pain, was limited by fatigue at EOS.    Personal Factors and Comorbidities Comorbidity 3+;Fitness;Behavior Pattern;Past/Current Experience;Time since onset of injury/illness/exacerbation    Comorbidities Arthritis, BMI over 30, Diabetes, Osteoporosis    Examination-Activity Limitations Bathing;Bed Mobility;Bend;Dressing;Hygiene/Grooming;Lift;Reach Overhead;Sleep    Examination-Participation Restrictions Meal Prep;Cleaning;Community Activity;Driving;Laundry    Stability/Clinical Decision Making Stable/Uncomplicated    Clinical Decision Making Low    Rehab Potential Good    PT Frequency 2x / week    PT Duration 12 weeks    PT Treatment/Interventions ADLs/Self Care Home Management;Cryotherapy;Electrical Stimulation;Iontophoresis 42m/ml Dexamethasone;Moist Heat;Traction;DME Instruction;Gait training;Stair  training;Functional mobility training;Therapeutic activities;Therapeutic exercise;Balance training;Neuromuscular re-education;Patient/family education;Manual techniques;Manual lymph drainage;Compression bandaging;Scar mobilization;Passive range of motion;Dry needling;Energy conservation;Splinting;Taping;Vasopneumatic Device    PT Next Visit Plan Continue ROM based exercises, add wall washing.  Progress shoulder exercises in concertic control only.  Avoid IR/externsion. Protocol in media and on PT desk and in book, begin PROM per protocol and progress to AAROM/ AROM as able, shoulder flex, abd, ER iso in neutral,  PT Home Exercise Plan 6/14 elbow flex/ext, wrist flex/ext, grip 6/16: table slides, flexion isometric; 10/30/20: isometric against wall flexion, abd and ER    Consulted and Agree with Plan of Care Patient             Patient will benefit from skilled therapeutic intervention in order to improve the following deficits and impairments:  Decreased range of motion, Decreased endurance, Increased muscle spasms, Impaired UE functional use, Pain, Decreased skin integrity, Decreased activity tolerance, Impaired flexibility, Improper body mechanics, Decreased strength, Decreased mobility  Visit Diagnosis: Right shoulder pain, unspecified chronicity  Stiffness of right shoulder, not elsewhere classified  Muscle weakness (generalized)  Other symptoms and signs involving the musculoskeletal system     Problem List Patient Active Problem List   Diagnosis Date Noted   Food impaction of esophagus    Hiatal hernia    Benign esophageal stricture    Postprocedural pneumothorax 09/17/2020   Diabetes mellitus type 2 in obese ([place]) 09/17/2020   GERD (gastroesophageal reflux disease) 09/17/2020   Leukocytosis 09/17/2020   S/P reverse total shoulder arthroplasty, right 09/17/2020   Acute respiratory failure with hypoxia ([facility]) 09/17/2020   Gout attack 11/08/2018   Edema 10/24/2018   Ankle  pain 06/06/2017   Upper airway cough syndrome 12/16/2015   Abnormal WBC count 02/26/2015   Arthritis 01/22/2015   History of gout 01/22/2015   Mild intermittent asthma without complication 36/92/2300   Ihor Austin, LPTA/CLT; CBIS [phone]  Aldona Lento 11/25/2020, 2:08 PM  [place], [place], [address] Phone: [phone]   Fax:  [phone]  Name: Marveen Donlon MRN: [id number] Date of Birth: 1950-10-04

## 2020-11-26 ENCOUNTER — Ambulatory Visit (INDEPENDENT_AMBULATORY_CARE_PROVIDER_SITE_OTHER): Payer: Medicare Other | Admitting: Podiatry

## 2020-11-26 ENCOUNTER — Other Ambulatory Visit: Payer: Self-pay

## 2020-11-26 ENCOUNTER — Encounter: Payer: Self-pay | Admitting: Podiatry

## 2020-11-26 DIAGNOSIS — L84 Corns and callosities: Secondary | ICD-10-CM | POA: Diagnosis not present

## 2020-11-26 DIAGNOSIS — B351 Tinea unguium: Secondary | ICD-10-CM | POA: Diagnosis not present

## 2020-11-26 DIAGNOSIS — E1142 Type 2 diabetes mellitus with diabetic polyneuropathy: Secondary | ICD-10-CM | POA: Diagnosis not present

## 2020-11-26 DIAGNOSIS — M2140 Flat foot [pes planus] (acquired), unspecified foot: Secondary | ICD-10-CM

## 2020-11-26 DIAGNOSIS — M79675 Pain in left toe(s): Secondary | ICD-10-CM

## 2020-11-26 DIAGNOSIS — M79674 Pain in right toe(s): Secondary | ICD-10-CM

## 2020-11-26 DIAGNOSIS — M19079 Primary osteoarthritis, unspecified ankle and foot: Secondary | ICD-10-CM

## 2020-11-26 NOTE — Progress Notes (Signed)
This patient returns to my office for at risk foot care.  This patient requires this care by a professional since this patient will be at risk due to having  Diabetes.   This patient is unable to cut nails himself since the patient cannot reach his nails.These nails are painful walking and wearing shoes. Patient is pleased with her diabetic shoes.  . This patient presents for at risk foot care today.  General Appearance  Alert, conversant and in no acute stress.  Vascular  Dorsalis pedis and posterior tibial  pulses are palpable  bilaterally.  Capillary return is within normal limits  bilaterally. Temperature is within normal limits  bilaterally.  Neurologic  Senn-Weinstein monofilament wire test within normal limits  bilaterally. Muscle power within normal limits bilaterally.  Nails Thick disfigured discolored nails with subungual debris  from hallux to fifth toes bilaterally. No evidence of bacterial infection or drainage bilaterally.  Orthopedic  No limitations of motion  feet .  No crepitus or effusions noted.  No bony pathology or digital deformities noted.  PTTD and foot arthritis right foot.  Skin  normotropic skin with no porokeratosis noted bilaterally.  No signs of infections or ulcers noted.   Symptomatic callus TNJ right foot.  Callus midarch medially.  Onychomycosis  Pain in right toes  Pain in left toes  Callus noted plantar  TNJ.  Consent was obtained for treatment procedures.   Mechanical debridement of nails 1-5  bilaterally performed with a nail nipper.  Filed with dremel without incident.  Debride callus with # 15 blade followed by dremel usage.   Return office visit   3 months                   Told patient to return for periodic foot care and evaluation due to potential at risk complications.   Spruha Weight DPM  

## 2020-11-27 ENCOUNTER — Ambulatory Visit (HOSPITAL_COMMUNITY): Payer: Medicare Other | Admitting: Physical Therapy

## 2020-11-27 DIAGNOSIS — M25611 Stiffness of right shoulder, not elsewhere classified: Secondary | ICD-10-CM | POA: Diagnosis not present

## 2020-11-27 DIAGNOSIS — M25511 Pain in right shoulder: Secondary | ICD-10-CM | POA: Diagnosis not present

## 2020-11-27 DIAGNOSIS — M6281 Muscle weakness (generalized): Secondary | ICD-10-CM

## 2020-11-27 DIAGNOSIS — R29898 Other symptoms and signs involving the musculoskeletal system: Secondary | ICD-10-CM | POA: Diagnosis not present

## 2020-11-27 NOTE — Therapy (Signed)
[place], [place], [address] Phone: [phone]   Fax:  [phone]  Physical Therapy Treatment  Patient Details  Name: Sheila [name] MRN: [id number] Date of Birth: July 04, 1950 Referring Provider (PT): Ophelia Charter MD   Encounter Date: 11/27/2020   PT End of Session - 11/27/20 1510     Number of Visits 24    Date for PT Re-Evaluation 12/23/20    Authorization Type Primary: Medicare Secondary BCBS supplement (no auth)    Progress Note Due on Visit 20    PT Start Time 1319    PT Stop Time 1405    PT Time Calculation (min) 46 min    Activity Tolerance Patient tolerated treatment well    Behavior During Therapy [facility] for tasks assessed/performed             Past Medical History:  Diagnosis Date   DM (diabetes mellitus) ([place])    GERD (gastroesophageal reflux disease)    Gout    Hyperlipidemia    Hypertension     Past Surgical History:  Procedure Laterality Date   ESOPHAGOGASTRODUODENOSCOPY N/A 11/07/2020   Procedure: ESOPHAGOGASTRODUODENOSCOPY (EGD);  Surgeon: Lavena Bullion, DO;  Location: [facility] ENDOSCOPY;  Service: Gastroenterology;  Laterality: N/A;   FOREIGN BODY REMOVAL  11/07/2020   Procedure: FOREIGN BODY REMOVAL;  Surgeon: Lavena Bullion, DO;  Location: [place];  Service: Gastroenterology;;   REVERSE SHOULDER ARTHROPLASTY Right 09/17/2020   Procedure: REVERSE SHOULDER ARTHROPLASTY;  Surgeon: Hiram Gash, MD;  Location: [place];  Service: Orthopedics;  Laterality: Right;    There were no vitals filed for this visit.       [facility] PT Assessment - 11/27/20 0001       AROM   Right Shoulder Flexion 140 Degrees    Right Shoulder ABduction 110 Degrees    Right Shoulder Internal Rotation 30 Degrees    Right Shoulder External Rotation 45 Degrees                           [facility] Adult PT Treatment/Exercise - 11/27/20 0001       Shoulder Exercises: Seated    Row Strengthening;Both;20 reps    Theraband Level (Shoulder Row) Level 3 (Green)    Row Limitations 3x10, Rt. shoulder to neutral extension point    Flexion AROM;20 reps    Flexion Limitations with PVC pipe      Shoulder Exercises: Standing   Other Standing Exercises wall wash 2x 30" flexion then horizontal abduction overhead; 10 cone first shelf, 5 cones 2nd shelf    Other Standing Exercises wall walks flexion and abduction x 10 each; able to make it to opening #11 flexion, #10 abduction      Shoulder Exercises: ROM/Strengthening   UBE (Upper Arm Bike) 3 min forward then 3 min backwards L1 resistance      Manual Therapy   Manual Therapy Passive ROM;Soft tissue mobilization    Manual therapy comments Completed separate from all other activity    Soft tissue mobilization to scar tissue and tight mm    Passive ROM Rt GHJ PROM (flex/ abduction, ER) per protocol                      PT Short Term Goals - 11/27/20 1422       PT SHORT TERM GOAL #1   Title Patient will be independent with HEP in order  to improve functional outcomes.    Baseline 10/30/20:  Reports compliance with HEP daily.    Status Partially Met      PT SHORT TERM GOAL #2   Title Patient will demonstrate R shoulder ROM 90 degrees flexion, 15 ER to show improving ROM per protocol.    Baseline 10/30/20:  Flexion at 112, Abd 92, ER at 35    Status Achieved      PT SHORT TERM GOAL #3   Title Patient will demonstrate R shoulder ROM 120 degrees flexion, 30 ER to show improving ROM per protocol.    Baseline 10/30/20:  Flexion at 112, Abd 92, ER at 35    Status Partially Met               PT Long Term Goals - 10/30/20 1408       PT LONG TERM GOAL #1   Title Patient will report at least 75% improvement in symptoms for improved quality of life.    Baseline 10/30/20: reports improvements by 70%    Status On-going      PT LONG TERM GOAL #2   Title Patient will improve FOTO score by at least 60 points  in order to indicate improved tolerance to activity.    Baseline 10/30/20:  FOTO 62.24% functional    Status Achieved      PT LONG TERM GOAL #3   Title Patient will demonstrate at least 4+/5  R shoulder MMT (except extension and IR per protocol) in order to improve ability to lift overhead to a cabinet.    Status On-going                   Plan - 11/27/20 1504     Clinical Impression Statement Note instability upon getting out of chair when called back for session.  Pt then had LOB when entering clinic to complete hand hygiene.  Discussed her LOB and pt reported she doesn't think anything could help this. Explained to patient we also work on balance if she would like to check into this following her UE therapy.  Began session on UBE to warm up postural mm.   Able to negotiate to #11 today with wall walking into flexion and #10 for abduction but with shoulder elevation/substitution noted at #9.  Manual completed to Rt shoulder with noted scar tissue/restrictions.  AROM measured today with typical capsular restrictions noted.    Personal Factors and Comorbidities Comorbidity 3+;Fitness;Behavior Pattern;Past/Current Experience;Time since onset of injury/illness/exacerbation    Comorbidities Arthritis, BMI over 30, Diabetes, Osteoporosis    Examination-Activity Limitations Bathing;Bed Mobility;Bend;Dressing;Hygiene/Grooming;Lift;Reach Overhead;Sleep    Examination-Participation Restrictions Meal Prep;Cleaning;Community Activity;Driving;Laundry    Stability/Clinical Decision Making Stable/Uncomplicated    Rehab Potential Good    PT Frequency 2x / week    PT Duration 12 weeks    PT Treatment/Interventions ADLs/Self Care Home Management;Cryotherapy;Electrical Stimulation;Iontophoresis 39m/ml Dexamethasone;Moist Heat;Traction;DME Instruction;Gait training;Stair training;Functional mobility training;Therapeutic activities;Therapeutic exercise;Balance training;Neuromuscular  re-education;Patient/family education;Manual techniques;Manual lymph drainage;Compression bandaging;Scar mobilization;Passive range of motion;Dry needling;Energy conservation;Splinting;Taping;Vasopneumatic Device    PT Next Visit Plan Continue ROM based exercises, add wall washing.  Progress shoulder exercises in concertic control only.  Avoid IR/externsion. Protocol in media and on PT desk and in book, begin PROM per protocol and progress to AAROM/ AROM as able, shoulder flex, abd, ER iso in neutral,    PT Home Exercise Plan 6/14 elbow flex/ext, wrist flex/ext, grip 6/16: table slides, flexion isometric; 10/30/20: isometric against wall flexion, abd and ER  Consulted and Agree with Plan of Care Patient             Patient will benefit from skilled therapeutic intervention in order to improve the following deficits and impairments:  Decreased range of motion, Decreased endurance, Increased muscle spasms, Impaired UE functional use, Pain, Decreased skin integrity, Decreased activity tolerance, Impaired flexibility, Improper body mechanics, Decreased strength, Decreased mobility  Visit Diagnosis: Right shoulder pain, unspecified chronicity  Stiffness of right shoulder, not elsewhere classified  Muscle weakness (generalized)  Other symptoms and signs involving the musculoskeletal system     Problem List Patient Active Problem List   Diagnosis Date Noted   Food impaction of esophagus    Hiatal hernia    Benign esophageal stricture    Postprocedural pneumothorax 09/17/2020   Diabetes mellitus type 2 in obese ([place]) 09/17/2020   GERD (gastroesophageal reflux disease) 09/17/2020   Leukocytosis 09/17/2020   S/P reverse total shoulder arthroplasty, right 09/17/2020   Acute respiratory failure with hypoxia ([place]) 09/17/2020   Gout attack 11/08/2018   Edema 10/24/2018   Ankle pain 06/06/2017   Upper airway cough syndrome 12/16/2015   Abnormal WBC count 02/26/2015   Arthritis 01/22/2015    History of gout 01/22/2015   Mild intermittent asthma without complication 50/72/2575   Teena Irani, PTA/CLT [phone]   Teena Irani 11/27/2020, 3:11 PM  [place] [address] [place], [place], [address] Phone: [phone]   Fax:  [phone]  Name: Sheila [name] MRN: [id number] Date of Birth: Oct 03, 1950

## 2020-11-28 ENCOUNTER — Other Ambulatory Visit (HOSPITAL_COMMUNITY): Payer: Medicare Other

## 2020-12-02 ENCOUNTER — Ambulatory Visit (HOSPITAL_COMMUNITY): Admit: 2020-12-02 | Payer: Medicare Other | Admitting: Gastroenterology

## 2020-12-02 ENCOUNTER — Ambulatory Visit (HOSPITAL_COMMUNITY): Payer: Medicare Other | Admitting: Physical Therapy

## 2020-12-02 ENCOUNTER — Encounter (HOSPITAL_COMMUNITY): Payer: Self-pay | Admitting: Physical Therapy

## 2020-12-02 ENCOUNTER — Encounter (HOSPITAL_COMMUNITY): Payer: Self-pay

## 2020-12-02 ENCOUNTER — Other Ambulatory Visit: Payer: Self-pay

## 2020-12-02 DIAGNOSIS — R29898 Other symptoms and signs involving the musculoskeletal system: Secondary | ICD-10-CM

## 2020-12-02 DIAGNOSIS — M25611 Stiffness of right shoulder, not elsewhere classified: Secondary | ICD-10-CM

## 2020-12-02 DIAGNOSIS — M6281 Muscle weakness (generalized): Secondary | ICD-10-CM | POA: Diagnosis not present

## 2020-12-02 DIAGNOSIS — M25511 Pain in right shoulder: Secondary | ICD-10-CM

## 2020-12-02 SURGERY — ESOPHAGOGASTRODUODENOSCOPY (EGD) WITH PROPOFOL
Anesthesia: Monitor Anesthesia Care

## 2020-12-02 NOTE — Therapy (Signed)
[place], [place], [address] Phone: [phone]   Fax:  [phone]  Physical Therapy Treatment  Patient Details  Name: Sheila [name] MRN: [id number] Date of Birth: 1950/06/30 Referring Provider (PT): Ophelia Charter MD   Encounter Date: 12/02/2020   PT End of Session - 12/02/20 1311     Visit Number 17    Number of Visits 24    Date for PT Re-Evaluation 12/23/20    Authorization Type Primary: Medicare Secondary BCBS supplement (no auth)    Progress Note Due on Visit 20    PT Start Time 1315    PT Stop Time 1355    PT Time Calculation (min) 40 min    Activity Tolerance Patient tolerated treatment well    Behavior During Therapy [facility] for tasks assessed/performed             Past Medical History:  Diagnosis Date   DM (diabetes mellitus) ([place])    GERD (gastroesophageal reflux disease)    Gout    Hyperlipidemia    Hypertension     Past Surgical History:  Procedure Laterality Date   ESOPHAGOGASTRODUODENOSCOPY N/A 11/07/2020   Procedure: ESOPHAGOGASTRODUODENOSCOPY (EGD);  Surgeon: Lavena Bullion, DO;  Location: [facility] ENDOSCOPY;  Service: Gastroenterology;  Laterality: N/A;   FOREIGN BODY REMOVAL  11/07/2020   Procedure: FOREIGN BODY REMOVAL;  Surgeon: Lavena Bullion, DO;  Location: [place];  Service: Gastroenterology;;   REVERSE SHOULDER ARTHROPLASTY Right 09/17/2020   Procedure: REVERSE SHOULDER ARTHROPLASTY;  Surgeon: Hiram Gash, MD;  Location: [place];  Service: Orthopedics;  Laterality: Right;    There were no vitals filed for this visit.   Subjective Assessment - 12/02/20 1313     Subjective Patient states R shoulder feeling good but left shoulder is bothering her. Exercise is going well.    Patient Stated Goals Improve arm mobility    Currently in Pain? No/denies                               [facility] Adult PT Treatment/Exercise - 12/02/20 0001        Shoulder Exercises: Seated   Row Both;10 reps    Theraband Level (Shoulder Row) Level 3 (Green)    Row Limitations 3x10, Rt. shoulder to neutral extension point    External Rotation Right;10 reps    Theraband Level (Shoulder External Rotation) Level 3 (Green)    External Rotation Limitations 2 sets    Other Seated Exercises short lever flexion with green band 2x 10 to 90; bicep curl 5# 2x 10    Other Seated Exercises tricep ext green band 2x 10      Shoulder Exercises: Standing   Other Standing Exercises wall walks flexion and abduction x 10 each; able to make it to opening #11 flexion, #10 abduction      Shoulder Exercises: ROM/Strengthening   UBE (Upper Arm Bike) 2 min forward then 2 min backwards L1 resistance                    PT Education - 12/02/20 1312     Education Details HEP    Person(s) Educated Patient    Methods Explanation    Comprehension Verbalized understanding              PT Short Term Goals - 11/27/20 1422       PT SHORT TERM GOAL #  1   Title Patient will be independent with HEP in order to improve functional outcomes.    Baseline 10/30/20:  Reports compliance with HEP daily.    Status Partially Met      PT SHORT TERM GOAL #2   Title Patient will demonstrate R shoulder ROM 90 degrees flexion, 15 ER to show improving ROM per protocol.    Baseline 10/30/20:  Flexion at 112, Abd 92, ER at 35    Status Achieved      PT SHORT TERM GOAL #3   Title Patient will demonstrate R shoulder ROM 120 degrees flexion, 30 ER to show improving ROM per protocol.    Baseline 10/30/20:  Flexion at 112, Abd 92, ER at 35    Status Partially Met               PT Long Term Goals - 10/30/20 1408       PT LONG TERM GOAL #1   Title Patient will report at least 75% improvement in symptoms for improved quality of life.    Baseline 10/30/20: reports improvements by 70%    Status On-going      PT LONG TERM GOAL #2   Title Patient will improve FOTO score by at  least 60 points in order to indicate improved tolerance to activity.    Baseline 10/30/20:  FOTO 62.24% functional    Status Achieved      PT LONG TERM GOAL #3   Title Patient will demonstrate at least 4+/5  R shoulder MMT (except extension and IR per protocol) in order to improve ability to lift overhead to a cabinet.    Status On-going                   Plan - 12/02/20 1312     Clinical Impression Statement Patient progressing well with PT intervention but remains limited by R shoulder strength with greatest difficulty above 80 degrees. Continued with shoulder and scapular strengthening which patient tolerates well. Patient given cueing for avoiding compensations with band exercises with fair/good carry over. Patient educated on continuing strengthening/ROM exercises at home. Patient will continue to benefit from skilled physical therapy in order to reduce impairment and improve function.    Personal Factors and Comorbidities Comorbidity 3+;Fitness;Behavior Pattern;Past/Current Experience;Time since onset of injury/illness/exacerbation    Comorbidities Arthritis, BMI over 30, Diabetes, Osteoporosis    Examination-Activity Limitations Bathing;Bed Mobility;Bend;Dressing;Hygiene/Grooming;Lift;Reach Overhead;Sleep    Examination-Participation Restrictions Meal Prep;Cleaning;Community Activity;Driving;Laundry    Stability/Clinical Decision Making Stable/Uncomplicated    Rehab Potential Good    PT Frequency 2x / week    PT Duration 12 weeks    PT Treatment/Interventions ADLs/Self Care Home Management;Cryotherapy;Electrical Stimulation;Iontophoresis 19m/ml Dexamethasone;Moist Heat;Traction;DME Instruction;Gait training;Stair training;Functional mobility training;Therapeutic activities;Therapeutic exercise;Balance training;Neuromuscular re-education;Patient/family education;Manual techniques;Manual lymph drainage;Compression bandaging;Scar mobilization;Passive range of motion;Dry  needling;Energy conservation;Splinting;Taping;Vasopneumatic Device    PT Next Visit Plan Continue ROM based exercises, add wall washing.  Progress shoulder exercises in concertic control only.  Avoid IR/externsion. Protocol in media and on PT desk and in book, begin PROM per protocol and progress to AAROM/ AROM as able, shoulder flex, abd, ER iso in neutral,    PT Home Exercise Plan 6/14 elbow flex/ext, wrist flex/ext, grip 6/16: table slides, flexion isometric; 10/30/20: isometric against wall flexion, abd and ER    Consulted and Agree with Plan of Care Patient             Patient will benefit from skilled therapeutic intervention in order to improve the  following deficits and impairments:  Decreased range of motion, Decreased endurance, Increased muscle spasms, Impaired UE functional use, Pain, Decreased skin integrity, Decreased activity tolerance, Impaired flexibility, Improper body mechanics, Decreased strength, Decreased mobility  Visit Diagnosis: Right shoulder pain, unspecified chronicity  Stiffness of right shoulder, not elsewhere classified  Muscle weakness (generalized)  Other symptoms and signs involving the musculoskeletal system     Problem List Patient Active Problem List   Diagnosis Date Noted   Food impaction of esophagus    Hiatal hernia    Benign esophageal stricture    Postprocedural pneumothorax 09/17/2020   Diabetes mellitus type 2 in obese ([place]) 09/17/2020   GERD (gastroesophageal reflux disease) 09/17/2020   Leukocytosis 09/17/2020   S/P reverse total shoulder arthroplasty, right 09/17/2020   Acute respiratory failure with hypoxia ([place]) 09/17/2020   Gout attack 11/08/2018   Edema 10/24/2018   Ankle pain 06/06/2017   Upper airway cough syndrome 12/16/2015   Abnormal WBC count 02/26/2015   Arthritis 01/22/2015   History of gout 01/22/2015   Mild intermittent asthma without complication 19/14/7829    2:00 PM, 12/02/20 Mearl Latin PT,  DPT Physical Therapist at [place] [address] [place], [place], [address] Phone: [phone]   Fax:  [phone]  Name: Sheila [name] MRN: [id number] Date of Birth: [birth year]

## 2020-12-02 NOTE — Patient Instructions (Signed)
Access Code: [email or web address] URL: [email or web address] Date: 12/02/2020 Prepared by: [facility] Jacky Dross  Exercises Standing Shoulder Row with Anchored Resistance - 1 x daily - 7 x weekly - 3 sets - 10 reps Standing Tricep Extensions with Resistance - 1 x daily - 7 x weekly - 3 sets - 10 reps

## 2020-12-04 ENCOUNTER — Encounter (HOSPITAL_COMMUNITY): Payer: Self-pay | Admitting: Physical Therapy

## 2020-12-04 ENCOUNTER — Other Ambulatory Visit: Payer: Self-pay

## 2020-12-04 ENCOUNTER — Ambulatory Visit (HOSPITAL_COMMUNITY): Payer: Medicare Other | Admitting: Physical Therapy

## 2020-12-04 DIAGNOSIS — M25511 Pain in right shoulder: Secondary | ICD-10-CM | POA: Diagnosis not present

## 2020-12-04 DIAGNOSIS — M25611 Stiffness of right shoulder, not elsewhere classified: Secondary | ICD-10-CM | POA: Diagnosis not present

## 2020-12-04 DIAGNOSIS — R29898 Other symptoms and signs involving the musculoskeletal system: Secondary | ICD-10-CM

## 2020-12-04 DIAGNOSIS — M6281 Muscle weakness (generalized): Secondary | ICD-10-CM | POA: Diagnosis not present

## 2020-12-04 NOTE — Therapy (Signed)
[place], [place], [address] Phone: [phone]   Fax:  [phone]  Physical Therapy Treatment  Patient Details  Name: Sheila [name] MRN: [id number] Date of Birth: Aug 19, 1950 Referring Provider (PT): Ophelia Charter MD   Encounter Date: 12/04/2020   PT End of Session - 12/04/20 1313     Visit Number 18    Number of Visits 24    Date for PT Re-Evaluation 12/23/20    Authorization Type Primary: Medicare Secondary BCBS supplement (no auth)    Progress Note Due on Visit 27    PT Start Time 1315    PT Stop Time 1355    PT Time Calculation (min) 40 min    Activity Tolerance Patient tolerated treatment well    Behavior During Therapy [facility] for tasks assessed/performed             Past Medical History:  Diagnosis Date   DM (diabetes mellitus) ([place])    GERD (gastroesophageal reflux disease)    Gout    Hyperlipidemia    Hypertension     Past Surgical History:  Procedure Laterality Date   ESOPHAGOGASTRODUODENOSCOPY N/A 11/07/2020   Procedure: ESOPHAGOGASTRODUODENOSCOPY (EGD);  Surgeon: Lavena Bullion, DO;  Location: [facility] ENDOSCOPY;  Service: Gastroenterology;  Laterality: N/A;   FOREIGN BODY REMOVAL  11/07/2020   Procedure: FOREIGN BODY REMOVAL;  Surgeon: Lavena Bullion, DO;  Location: [place];  Service: Gastroenterology;;   REVERSE SHOULDER ARTHROPLASTY Right 09/17/2020   Procedure: REVERSE SHOULDER ARTHROPLASTY;  Surgeon: Hiram Gash, MD;  Location: [place];  Service: Orthopedics;  Laterality: Right;    There were no vitals filed for this visit.   Subjective Assessment - 12/04/20 1314     Subjective Patient states left shoulder sore. R arm was feeling fine after last session. she found a pully in her garage and will set it up.    Patient Stated Goals Improve arm mobility    Currently in Pain? No/denies                               [facility] Adult PT  Treatment/Exercise - 12/04/20 0001       Shoulder Exercises: Seated   Row Both;10 reps    Theraband Level (Shoulder Row) Level 3 (Green)    Row Limitations 3x15, Rt. shoulder to neutral extension point    External Rotation Right;10 reps    Theraband Level (Shoulder External Rotation) Level 3 (Green)    External Rotation Limitations 2 sets    Other Seated Exercises short lever flexion with green band 3x 10 to 90; bicep curl 5# 3x 10      Shoulder Exercises: Pulleys   Scaption 2 minutes    Scaption Limitations for end range motion and eccentric, slow lowering      Shoulder Exercises: Therapy Ball   Flexion Both;10 reps      Shoulder Exercises: ROM/Strengthening   UBE (Upper Arm Bike) 2 min forward then 2 min backwards L1 resistance                    PT Education - 12/04/20 1314     Education Details HEP, getting referral for leg strength if wanted    Person(s) Educated Patient    Methods Explanation    Comprehension Verbalized understanding              PT Short Term Goals -  11/27/20 1422       PT SHORT TERM GOAL #1   Title Patient will be independent with HEP in order to improve functional outcomes.    Baseline 10/30/20:  Reports compliance with HEP daily.    Status Partially Met      PT SHORT TERM GOAL #2   Title Patient will demonstrate R shoulder ROM 90 degrees flexion, 15 ER to show improving ROM per protocol.    Baseline 10/30/20:  Flexion at 112, Abd 92, ER at 35    Status Achieved      PT SHORT TERM GOAL #3   Title Patient will demonstrate R shoulder ROM 120 degrees flexion, 30 ER to show improving ROM per protocol.    Baseline 10/30/20:  Flexion at 112, Abd 92, ER at 35    Status Partially Met               PT Long Term Goals - 10/30/20 1408       PT LONG TERM GOAL #1   Title Patient will report at least 75% improvement in symptoms for improved quality of life.    Baseline 10/30/20: reports improvements by 70%    Status On-going       PT LONG TERM GOAL #2   Title Patient will improve FOTO score by at least 60 points in order to indicate improved tolerance to activity.    Baseline 10/30/20:  FOTO 62.24% functional    Status Achieved      PT LONG TERM GOAL #3   Title Patient will demonstrate at least 4+/5  R shoulder MMT (except extension and IR per protocol) in order to improve ability to lift overhead to a cabinet.    Status On-going                   Plan - 12/04/20 1314     Clinical Impression Statement Continued with previously completed exercises for strengthening and patient able to complete increased reps. Patient fatigues quickly with strengthening exercises and requires cueing for avoiding compensation. Patient requires cueing for rest breaks and takes several seated rest breaks during session. Patient fatigued at end of session. Will reassess next session. Patient will continue to benefit from skilled physical therapy in order to reduce impairment and improve function.    Personal Factors and Comorbidities Comorbidity 3+;Fitness;Behavior Pattern;Past/Current Experience;Time since onset of injury/illness/exacerbation    Comorbidities Arthritis, BMI over 30, Diabetes, Osteoporosis    Examination-Activity Limitations Bathing;Bed Mobility;Bend;Dressing;Hygiene/Grooming;Lift;Reach Overhead;Sleep    Examination-Participation Restrictions Meal Prep;Cleaning;Community Activity;Driving;Laundry    Stability/Clinical Decision Making Stable/Uncomplicated    Rehab Potential Good    PT Frequency 2x / week    PT Duration 12 weeks    PT Treatment/Interventions ADLs/Self Care Home Management;Cryotherapy;Electrical Stimulation;Iontophoresis 22m/ml Dexamethasone;Moist Heat;Traction;DME Instruction;Gait training;Stair training;Functional mobility training;Therapeutic activities;Therapeutic exercise;Balance training;Neuromuscular re-education;Patient/family education;Manual techniques;Manual lymph drainage;Compression  bandaging;Scar mobilization;Passive range of motion;Dry needling;Energy conservation;Splinting;Taping;Vasopneumatic Device    PT Next Visit Plan Avoid IR/externsion. Protocol in media and on PT desk and in book, begin PROM per protocol, reassess next session    PT Home Exercise Plan 6/14 elbow flex/ext, wrist flex/ext, grip 6/16: table slides, flexion isometric; 10/30/20: isometric against wall flexion, abd and ER    Consulted and Agree with Plan of Care Patient             Patient will benefit from skilled therapeutic intervention in order to improve the following deficits and impairments:  Decreased range of motion, Decreased endurance, Increased muscle spasms, Impaired  UE functional use, Pain, Decreased skin integrity, Decreased activity tolerance, Impaired flexibility, Improper body mechanics, Decreased strength, Decreased mobility  Visit Diagnosis: Right shoulder pain, unspecified chronicity  Stiffness of right shoulder, not elsewhere classified  Muscle weakness (generalized)  Other symptoms and signs involving the musculoskeletal system     Problem List Patient Active Problem List   Diagnosis Date Noted   Food impaction of esophagus    Hiatal hernia    Benign esophageal stricture    Postprocedural pneumothorax 09/17/2020   Diabetes mellitus type 2 in obese ([place]) 09/17/2020   GERD (gastroesophageal reflux disease) 09/17/2020   Leukocytosis 09/17/2020   S/P reverse total shoulder arthroplasty, right 09/17/2020   Acute respiratory failure with hypoxia ([place]) 09/17/2020   Gout attack 11/08/2018   Edema 10/24/2018   Ankle pain 06/06/2017   Upper airway cough syndrome 12/16/2015   Abnormal WBC count 02/26/2015   Arthritis 01/22/2015   History of gout 01/22/2015   Mild intermittent asthma without complication 07/30/6436    1:57 PM, 12/04/20 Mearl Latin PT, DPT Physical Therapist at [place], [place], [address] Phone: [phone]   Fax:  [phone]  Name: Sheila [name] MRN: [id number] Date of Birth: 05-Dec-1950

## 2020-12-09 ENCOUNTER — Encounter (HOSPITAL_COMMUNITY): Payer: Self-pay | Admitting: Physical Therapy

## 2020-12-09 ENCOUNTER — Other Ambulatory Visit: Payer: Self-pay

## 2020-12-09 ENCOUNTER — Ambulatory Visit (HOSPITAL_COMMUNITY): Payer: Medicare Other | Admitting: Physical Therapy

## 2020-12-09 DIAGNOSIS — M25511 Pain in right shoulder: Secondary | ICD-10-CM

## 2020-12-09 DIAGNOSIS — M25611 Stiffness of right shoulder, not elsewhere classified: Secondary | ICD-10-CM

## 2020-12-09 DIAGNOSIS — R29898 Other symptoms and signs involving the musculoskeletal system: Secondary | ICD-10-CM

## 2020-12-09 DIAGNOSIS — M6281 Muscle weakness (generalized): Secondary | ICD-10-CM | POA: Diagnosis not present

## 2020-12-09 NOTE — Therapy (Signed)
[place], [place], [address] Phone: [phone]   Fax:  [phone]  Physical Therapy Treatment/Discharge Summary  Patient Details  Name: Sheila [name] MRN: [id number] Date of Birth: 1950/11/02 Referring Provider (PT): Ophelia Charter MD   Encounter Date: 12/09/2020  PHYSICAL THERAPY DISCHARGE SUMMARY  Visits from Start of Care: 19  Current functional level related to goals / functional outcomes: See below   Remaining deficits: See below   Education / Equipment: See below   Patient agrees to discharge. Patient goals were partially met. Patient is being discharged due to being pleased with the current functional level.    PT End of Session - 12/09/20 1314     Visit Number 19    Number of Visits 24    Date for PT Re-Evaluation 12/23/20    Authorization Type Primary: Medicare Secondary BCBS supplement (no auth)    Progress Note Due on Visit 35    PT Start Time 1315    PT Stop Time 1340    PT Time Calculation (min) 25 min    Activity Tolerance Patient tolerated treatment well    Behavior During Therapy WFL for tasks assessed/performed             Past Medical History:  Diagnosis Date   DM (diabetes mellitus) ([place])    GERD (gastroesophageal reflux disease)    Gout    Hyperlipidemia    Hypertension     Past Surgical History:  Procedure Laterality Date   ESOPHAGOGASTRODUODENOSCOPY N/A 11/07/2020   Procedure: ESOPHAGOGASTRODUODENOSCOPY (EGD);  Surgeon: Lavena Bullion, DO;  Location: [facility] ENDOSCOPY;  Service: Gastroenterology;  Laterality: N/A;   FOREIGN BODY REMOVAL  11/07/2020   Procedure: FOREIGN BODY REMOVAL;  Surgeon: Lavena Bullion, DO;  Location: [facility];  Service: Gastroenterology;;   REVERSE SHOULDER ARTHROPLASTY Right 09/17/2020   Procedure: REVERSE SHOULDER ARTHROPLASTY;  Surgeon: Hiram Gash, MD;  Location: [facility];  Service: Orthopedics;  Laterality: Right;     There were no vitals filed for this visit.   Subjective Assessment - 12/09/20 1315     Subjective Patient states home exercises are going well. Patient states 90% improvement with PT intervention. She doesn't really have much trouble with her R arm lately.    Patient Stated Goals Improve arm mobility    Currently in Pain? No/denies                [facility] PT Assessment - 12/09/20 0001       Assessment   Medical Diagnosis R reverse total shoulder arthoplasty    Referring Provider (PT) Ophelia Charter MD    Onset Date/Surgical Date 09/17/20    Next MD Visit 10/16/20      Precautions   Precautions Shoulder    Type of Shoulder Precautions Reverse total shoulder      Restrictions   Weight Bearing Restrictions Yes    RUE Weight Bearing --      Prior Function   Level of Independence Independent with basic ADLs    Vocation Retired      Associate Professor   Overall Cognitive Status Within Functional Limits for tasks assessed      Observation/Other Assessments   Observations --    Focus on Therapeutic Outcomes (FOTO)  71% function      AROM   Overall AROM Comments in seated, decreased L UE motion overhead    Right Shoulder Flexion 110 Degrees    Right Shoulder ABduction 80  Degrees    Right Shoulder External Rotation 40 Degrees      PROM   Overall PROM Comments AAROM in supine    Right Shoulder Flexion 140 Degrees    Right Shoulder ABduction 110 Degrees      Strength   Overall Strength Comments good strength in limited range RUE    Right Shoulder Flexion 3-/5    Right Shoulder ABduction 3-/5    Right Shoulder External Rotation 3-/5    Left Shoulder Flexion 5/5    Left Shoulder ABduction 5/5    Left Shoulder Internal Rotation 5/5    Left Shoulder External Rotation 5/5                           [facility] Adult PT Treatment/Exercise - 12/09/20 0001       Shoulder Exercises: ROM/Strengthening   UBE (Upper Arm Bike) 2 min forward then 2 min backwards L1  resistance                    PT Education - 12/09/20 1315     Education Details HEP    Person(s) Educated Patient    Methods Explanation    Comprehension Verbalized understanding              PT Short Term Goals - 11/27/20 1422       PT SHORT TERM GOAL #1   Title Patient will be independent with HEP in order to improve functional outcomes.    Baseline 10/30/20:  Reports compliance with HEP daily.    Status Partially Met      PT SHORT TERM GOAL #2   Title Patient will demonstrate R shoulder ROM 90 degrees flexion, 15 ER to show improving ROM per protocol.    Baseline 10/30/20:  Flexion at 112, Abd 92, ER at 35    Status Achieved      PT SHORT TERM GOAL #3   Title Patient will demonstrate R shoulder ROM 120 degrees flexion, 30 ER to show improving ROM per protocol.    Baseline 10/30/20:  Flexion at 112, Abd 92, ER at 35    Status Partially Met               PT Long Term Goals - 12/09/20 1320       PT LONG TERM GOAL #1   Title Patient will report at least 75% improvement in symptoms for improved quality of life.    Baseline 10/30/20: reports improvements by 70% 8/23 90%    Status Achieved      PT LONG TERM GOAL #2   Title Patient will improve FOTO score by at least 60 points in order to indicate improved tolerance to activity.    Baseline 10/30/20:  FOTO 62.24% functional    Status Achieved      PT LONG TERM GOAL #3   Title Patient will demonstrate at least 4+/5  R shoulder MMT (except extension and IR per protocol) in order to improve ability to lift overhead to a cabinet.    Status Not Met                   Plan - 12/09/20 1315     Clinical Impression Statement Patient overall pleased with current functional status. Patient has met 1/3 short term goals with remaining short term being partially met and 2/3 long term goals with ability to complete HEP and improvements in strength, ROM, symptoms, functional  mobility. Patient continues to  remain limited due to impaired AROM and strength. Patient lacking end range motion and strength overhead. Patient with fair/good strength below 90 degrees. Reviewed HEP with patient and patient wishing to continue with HEP at this time and educated on returning if needed. Patient discharged from skilled physical therapy at this time.    Personal Factors and Comorbidities Comorbidity 3+;Fitness;Behavior Pattern;Past/Current Experience;Time since onset of injury/illness/exacerbation    Comorbidities Arthritis, BMI over 30, Diabetes, Osteoporosis    Examination-Activity Limitations Bathing;Bed Mobility;Bend;Dressing;Hygiene/Grooming;Lift;Reach Overhead;Sleep    Examination-Participation Restrictions Meal Prep;Cleaning;Community Activity;Driving;Laundry    Stability/Clinical Decision Making Stable/Uncomplicated    Rehab Potential Good    PT Frequency --    PT Duration --    PT Treatment/Interventions ADLs/Self Care Home Management;Cryotherapy;Electrical Stimulation;Iontophoresis 78m/ml Dexamethasone;Moist Heat;Traction;DME Instruction;Gait training;Stair training;Functional mobility training;Therapeutic activities;Therapeutic exercise;Balance training;Neuromuscular re-education;Patient/family education;Manual techniques;Manual lymph drainage;Compression bandaging;Scar mobilization;Passive range of motion;Dry needling;Energy conservation;Splinting;Taping;Vasopneumatic Device    PT Next Visit Plan n/a    PT Home Exercise Plan 6/14 elbow flex/ext, wrist flex/ext, grip 6/16: table slides, flexion isometric; 10/30/20: isometric against wall flexion, abd and ER    Consulted and Agree with Plan of Care Patient             Patient will benefit from skilled therapeutic intervention in order to improve the following deficits and impairments:  Decreased range of motion, Decreased endurance, Increased muscle spasms, Impaired UE functional use, Pain, Decreased skin integrity, Decreased activity tolerance, Impaired  flexibility, Improper body mechanics, Decreased strength, Decreased mobility  Visit Diagnosis: Right shoulder pain, unspecified chronicity  Stiffness of right shoulder, not elsewhere classified  Muscle weakness (generalized)  Other symptoms and signs involving the musculoskeletal system     Problem List Patient Active Problem List   Diagnosis Date Noted   Food impaction of esophagus    Hiatal hernia    Benign esophageal stricture    Postprocedural pneumothorax 09/17/2020   Diabetes mellitus type 2 in obese ([place] 09/17/2020   GERD (gastroesophageal reflux disease) 09/17/2020   Leukocytosis 09/17/2020   S/P reverse total shoulder arthroplasty, right 09/17/2020   Acute respiratory failure with hypoxia (H[facility]) 09/17/2020   Gout attack 11/08/2018   Edema 10/24/2018   Ankle pain 06/06/2017   Upper airway cough syndrome 12/16/2015   Abnormal WBC count 02/26/2015   Arthritis 01/22/2015   History of gout 01/22/2015   Mild intermittent asthma without complication 109/38/1829  1:47 PM, 12/09/20 AMearl LatinPT, DPT Physical Therapist at [facility] [address]Phone: [phone]  Fax:  [phone] Name: Sheila PosaMRN: [id number] of Birth: 1December 03, 1952

## 2020-12-10 ENCOUNTER — Ambulatory Visit (INDEPENDENT_AMBULATORY_CARE_PROVIDER_SITE_OTHER): Payer: Medicare Other | Admitting: Gastroenterology

## 2020-12-11 ENCOUNTER — Encounter (HOSPITAL_COMMUNITY): Payer: Medicare Other | Admitting: Physical Therapy

## 2020-12-11 DIAGNOSIS — M19011 Primary osteoarthritis, right shoulder: Secondary | ICD-10-CM | POA: Diagnosis not present

## 2020-12-12 ENCOUNTER — Encounter (HOSPITAL_COMMUNITY): Payer: Medicare Other | Admitting: Physical Therapy

## 2020-12-17 DIAGNOSIS — E119 Type 2 diabetes mellitus without complications: Secondary | ICD-10-CM | POA: Diagnosis not present

## 2021-01-05 DIAGNOSIS — E1165 Type 2 diabetes mellitus with hyperglycemia: Secondary | ICD-10-CM | POA: Diagnosis not present

## 2021-01-05 DIAGNOSIS — R1310 Dysphagia, unspecified: Secondary | ICD-10-CM | POA: Diagnosis not present

## 2021-01-05 DIAGNOSIS — Z299 Encounter for prophylactic measures, unspecified: Secondary | ICD-10-CM | POA: Diagnosis not present

## 2021-01-05 DIAGNOSIS — I10 Essential (primary) hypertension: Secondary | ICD-10-CM | POA: Diagnosis not present

## 2021-01-05 DIAGNOSIS — F321 Major depressive disorder, single episode, moderate: Secondary | ICD-10-CM | POA: Diagnosis not present

## 2021-01-06 ENCOUNTER — Encounter (INDEPENDENT_AMBULATORY_CARE_PROVIDER_SITE_OTHER): Payer: Self-pay | Admitting: *Deleted

## 2021-01-12 ENCOUNTER — Ambulatory Visit (INDEPENDENT_AMBULATORY_CARE_PROVIDER_SITE_OTHER): Payer: Medicare Other | Admitting: Gastroenterology

## 2021-01-16 DIAGNOSIS — E119 Type 2 diabetes mellitus without complications: Secondary | ICD-10-CM | POA: Diagnosis not present

## 2021-01-20 DIAGNOSIS — Z23 Encounter for immunization: Secondary | ICD-10-CM | POA: Diagnosis not present

## 2021-02-02 ENCOUNTER — Other Ambulatory Visit: Payer: Self-pay

## 2021-02-02 ENCOUNTER — Other Ambulatory Visit (INDEPENDENT_AMBULATORY_CARE_PROVIDER_SITE_OTHER): Payer: Self-pay

## 2021-02-02 ENCOUNTER — Encounter (INDEPENDENT_AMBULATORY_CARE_PROVIDER_SITE_OTHER): Payer: Self-pay | Admitting: Gastroenterology

## 2021-02-02 ENCOUNTER — Encounter (INDEPENDENT_AMBULATORY_CARE_PROVIDER_SITE_OTHER): Payer: Self-pay

## 2021-02-02 ENCOUNTER — Ambulatory Visit (INDEPENDENT_AMBULATORY_CARE_PROVIDER_SITE_OTHER): Payer: Medicare Other | Admitting: Gastroenterology

## 2021-02-02 VITALS — BP 152/94 | Temp 99.0°F | Ht 60.0 in | Wt 194.9 lb

## 2021-02-02 DIAGNOSIS — R1310 Dysphagia, unspecified: Secondary | ICD-10-CM | POA: Diagnosis not present

## 2021-02-02 DIAGNOSIS — K222 Esophageal obstruction: Secondary | ICD-10-CM

## 2021-02-02 DIAGNOSIS — I119 Hypertensive heart disease without heart failure: Secondary | ICD-10-CM

## 2021-02-02 DIAGNOSIS — Z01812 Encounter for preprocedural laboratory examination: Secondary | ICD-10-CM

## 2021-02-02 NOTE — Patient Instructions (Addendum)
We will schedule you for repeat EGD for esophageal dilation  Continue to take small bites and chew thoroughly, drinking liquids between bites.  Continue your protonix 40mg , please take one in the mornings 30-45 minutes before eating and a second one prior to dinner.  You can take imodium as needed for your diarrhea, this can be obtained over the counter, do not exceed 4 tablets in a 24 hour period  If you begin having diarrhea or abdominal pain more frequently, please let me know  Follow up 3 months

## 2021-02-02 NOTE — H&P (View-Only) (Signed)
 Referring Provider: Vyas, Dhruv B, MD Primary Care Physician:  Vyas, Dhruv B, MD Primary GI Physician: newly establish, Dr. Castaneda  Chief Complaint  Patient presents with   Dysphagia    Went to [facility] back in July when she got some food stuck. Pt has been cautions since then about what she eats and chewing up food. Pt states she was told she needed to have her esophagus stretched.     HPI:   Sheila [name] is a 69 y.o. female with past medical history of DM, GERD, HLD, HTN  Patient presenting today for follow up of dysphagia.   Patient had food impaction in July 2022 which she was seen in ED for. She ate some barbecue that she felt got stuck in her retrosternal area, she was not able to swallow water, or anything else thereafter. She reported intermittent dysphagia for one year prior to the episode. She was transferred to [facility] hospital for emergent EGD for food bolus removal due to concerns for her requiring a higher level of anesthesia care based on previous difficult airway. She had EGD with removal of food bolus by Dr. Cirigliano and was supposed to have subsequent EGD with esophageal dilation done 4 weeks later, however, she states she was not aware she was scheduled for a repeat EGD until she got a reminder in the mail and due to her husband being in poor health, she had to cancel. She was put on protonix 40mg BID with carafate liquid 1 g before meals and at bedtime x4 weeks after EGD in July.  She reports that she is eating a regular diet at this time, states she has not had any issues with food becoming stuck, she states she chews thoroughly, takes very small bites and usually does not have any issues with food getting stuck. She is avoiding thicker foods and meats. She denies any issues with pill dysphagia or liquids.  She does endorse some reflux, she is taking her protonix 40mg once daily, but taking in the evenings, she states that sometimes she will have reflux at night with acid  regurgitation.   She does endorses some watery diarrhea maybe twice per week. She will occasionally use pepto bismol or ginger ale. She states that the ginger ale really helps to settler her stomach. She will sometimes have LLQ pain associated with the episodes of diarrhea, she denies any intermittent constipation, no melena or hematochezia.   No red flag symptoms. Patient denies nausea, vomiting, constipation, dysphagia, odyonophagia, early satiety or weight loss.   Last Colonoscopy: approx 8 years ago in [place], normal per patient Last Endoscopy:7/22/22Food impaction in the lower third of the esophagus. This was advanced into the stomach using air insufflation and gentle endoscopic pressure.- Small hiatal hernia. - Benign-appearing esophageal stenosis. - Normal stomach. - Normal examined duodenum. - No specimens collected.  Recommendations:  Repeat EGD for esophageal dilation  Past Medical History:  Diagnosis Date   DM (diabetes mellitus) (H[facility])    GERD (gastroesophageal reflux disease)    Gout    Hyperlipidemia    Hypertension     Past Surgical History:  Procedure Laterality Date   ESOPHAGOGASTRODUODENOSCOPY N/A 11/07/2020   Procedure: ESOPHAGOGASTRODUODENOSCOPY (EGD);  Surgeon: Cirigliano, Vito V, DO;  Location: [facility] ENDOSCOPY;  Service: Gastroenterology;  Laterality: N/A;   FOREIGN BODY REMOVAL  11/07/2020   Procedure: FOREIGN BODY REMOVAL;  Surgeon: Cirigliano, Vito V, DO;  Location: [facility] ENDOSCOPY;  Service: Gastroenterology;;   REVERSE SHOULDER ARTHROPLASTY Right 09/17/2020     Procedure: REVERSE SHOULDER ARTHROPLASTY;  Surgeon: Varkey, Dax T, MD;  Location: [facility];  Service: Orthopedics;  Laterality: Right;    Current Outpatient Medications  Medication Sig Dispense Refill   ASPERCREME LIDOCAINE 4 % CREA Apply 1 application topically as needed (for pain).     atorvastatin (LIPITOR) 20 MG tablet Take 20 mg by mouth at bedtime.     colchicine-probenecid 0.5-500 MG  tablet Take 1 tablet by mouth daily.     CONTOUR NEXT TEST test strip 4 (four) times daily. for testing     docusate sodium (COLACE) 100 MG capsule Take 1 capsule (100 mg total) by mouth 2 (two) times daily. 10 capsule 0   furosemide (LASIX) 40 MG tablet Take 40 mg by mouth 2 (two) times daily.     GLOBAL EASE INJECT PEN NEEDLES 31G X 5 MM MISC 4 (four) times daily.     JARDIANCE 25 MG TABS tablet Take 12.5 mg by mouth in the morning.     NOVOLOG FLEXPEN 100 UNIT/ML FlexPen Inject 16 Units into the skin See admin instructions. Inject 16 units into thee skin two times a day before meals, per sliding scale     omeprazole (PRILOSEC) 20 MG capsule Take 20 mg by mouth daily before breakfast.     omeprazole (PRILOSEC) 20 MG capsule Take 1 capsule (20 mg total) by mouth daily. To gastric protection while taking NSAIDs 30 capsule 0   pantoprazole (PROTONIX) 40 MG tablet Take 1 tablet (40 mg total) by mouth 2 (two) times daily. 90 tablet 3   polyethylene glycol (MIRALAX / GLYCOLAX) 17 g packet Take 17 g by mouth daily. 14 each 0   sucralfate (CARAFATE) 1 GM/10ML suspension Take 10 mLs (1 g total) by mouth 4 (four) times daily. 420 mL 3   traMADol (ULTRAM) 50 MG tablet Take 50 mg by mouth 3 (three) times daily as needed.     TRESIBA FLEXTOUCH 200 UNIT/ML SOPN Inject 52 Units into the skin every evening.     triamcinolone cream (KENALOG) 0.1 % Apply 1 application topically 3 (three) times daily as needed (for itching).     No current facility-administered medications for this visit.    Allergies as of 02/02/2021 - Review Complete 02/02/2021  Allergen Reaction Noted   Lisinopril Diarrhea 10/24/2018   Metformin Diarrhea 01/21/2015    Family History  Problem Relation Age of Onset   Heart disease Father     Social History   Socioeconomic History   Marital status: Married    Spouse name: Not on file   Number of children: Not on file   Years of education: Not on file   Highest education level:  Not on file  Occupational History   Not on file  Tobacco Use   Smoking status: Former    Packs/day: 1.00    Years: 2.00    Pack years: 2.00    Types: Cigarettes    Quit date: 04/20/1975    Years since quitting: 45.8   Smokeless tobacco: Never  Substance and Sexual Activity   Alcohol use: Never   Drug use: Never   Sexual activity: Not on file  Other Topics Concern   Not on file  Social History Narrative   Not on file   Social Determinants of Health   Financial Resource Strain: Not on file  Food Insecurity: Not on file  Transportation Needs: Not on file  Physical Activity: Not on file  Stress: Not on file  Social   Connections: Not on file   Review of Systems: Gen: Denies fever, chills, anorexia. Denies fatigue, weakness, weight loss.  CV: Denies chest pain, palpitations, syncope, peripheral edema, and claudication. Resp: Denies dyspnea at rest, cough, wheezing, coughing up blood, and pleurisy. GI: Denies denies melena, hematochezia, nausea, vomiting, constipation, dysphagia, odyonophagia, early satiety or weight loss. +diarrhea twice/week Derm: Denies rash, itching, dry skin Psych: Denies depression, anxiety, memory loss, confusion. No homicidal or suicidal ideation.  Heme: Denies bruising, bleeding, and enlarged lymph nodes.  Physical Exam: There were no vitals taken for this visit. General:   Alert and oriented. No distress noted. Pleasant and cooperative.  Head:  Normocephalic and atraumatic. Eyes:  Conjuctiva clear without scleral icterus. Mouth:  Oral mucosa pink and moist. Good dentition. No lesions. Heart: Normal rate and rhythm, s1 and s2 heart sounds present.  Lungs: Clear lung sounds in all lobes. Respirations equal and unlabored. Abdomen:  +BS, soft, non-tender and non-distended. No rebound or guarding. No HSM or masses noted. Derm: No palmar erythema or jaundice Msk:  Symmetrical without gross deformities. Normal posture. Extremities:  Without  edema. Neurologic:  Alert and  oriented x4 Psych:  Alert and cooperative. Normal mood and affect.  Invalid input(s): 6 MONTHS   ASSESSMENT: Sheila [name] is a 70 y.o. female presenting today for follow up of dysphagia after having food impact in July 2022 requiring transfer to [facility] hospital for emergent EGD to remove food bolus, r/t requirements of higher anesthesia care.  Patient was supposed to have repeat EGD for esophageal dilation in August, however, she reports she was never made aware she was scheduled for the procedure until she got a reminder for it and she had to cancel due to her husband being in poor health. She reports no further issues with dysphagia since, however, she takes very small bites, chews thoroughly and avoids meats and thicker foods. She denies any issues with pill dysphagia or liquids. She is taking protonix 40mg  once daily, in the evenings. She is still experiencing some reflux and acid regurgitation a few times per week. Protonix was initially prescribed by Dr. BID, however, patient reports she was unaware she was supposed to take it BID, we will increase it back to twice daily. If reflux is not improved with twice daily dosing, she will let me know, she should continue chewing thoroughly, taking small bites and avoiding thick foods until after esophageal dilation.   She reports having diarrhea maybe twice per week, states this has been ongoing for some time, she has some associated LLQ pain prior to defecating sometimes. She denies fevers, chills, nausea or vomiting, no melena or hematochezia. Symptoms appear chronic, are likely related to underlying IBS. We discussed bentyl as needed for abdominal pain, however, patient did not feel that she needed any medication for her symptoms at this time, she can take imodium over the counter as needed. She typically drinks ginger ale or takes pepto bismol occasionally which provides good results. If diarrhea becomes more  frequent or she has worsening abdominal pain, melena or hematochezia, she will let me know.   PLAN:  Continue small bites, chewing thoroughly 2. Schedule EGD for esophageal dilation 3. Continue protonix 40mg  BID 4. Imodium as needed for diarrhea  Follow Up: 3 months  Mannix Kroeker L. Barron Alvine, MSN, APRN, AGNP-C Adult-Gerontology Nurse Practitioner [facility]

## 2021-02-02 NOTE — Progress Notes (Signed)
Referring Provider: Ignatius Specking, MD Primary Care Physician:  Ignatius Specking, MD Primary GI Physician: newly establish, Dr. Levon Hedger  Chief Complaint  Patient presents with   Dysphagia    Went to [facility] back in July when she got some food stuck. Pt has been cautions since then about what she eats and chewing up food. Pt states she was told she needed to have her esophagus stretched.     HPI:   Sheila [name] is a 70 y.o. female with past medical history of DM, GERD, HLD, HTN  Patient presenting today for follow up of dysphagia.   Patient had food impaction in July 2022 which she was seen in ED for. She ate some barbecue that she felt got stuck in her retrosternal area, she was not able to swallow water, or anything else thereafter. She reported intermittent dysphagia for one year prior to the episode. She was transferred to [facility] hospital for emergent EGD for food bolus removal due to concerns for her requiring a higher level of anesthesia care based on previous difficult airway. She had EGD with removal of food bolus by Dr. Barron Alvine and was supposed to have subsequent EGD with esophageal dilation done 4 weeks later, however, she states she was not aware she was scheduled for a repeat EGD until she got a reminder in the mail and due to her husband being in poor health, she had to cancel. She was put on protonix 40mg  BID with carafate liquid 1 g before meals and at bedtime x4 weeks after EGD in July.  She reports that she is eating a regular diet at this time, states she has not had any issues with food becoming stuck, she states she chews thoroughly, takes very small bites and usually does not have any issues with food getting stuck. She is avoiding thicker foods and meats. She denies any issues with pill dysphagia or liquids.  She does endorse some reflux, she is taking her protonix 40mg  once daily, but taking in the evenings, she states that sometimes she will have reflux at night with acid  regurgitation.   She does endorses some watery diarrhea maybe twice per week. She will occasionally use pepto bismol or ginger ale. She states that the ginger ale really helps to settler her stomach. She will sometimes have LLQ pain associated with the episodes of diarrhea, she denies any intermittent constipation, no melena or hematochezia.   No red flag symptoms. Patient denies nausea, vomiting, constipation, dysphagia, odyonophagia, early satiety or weight loss.   Last Colonoscopy: approx 8 years ago in [place], normal per patient Last Endoscopy:7/22/22Food impaction in the lower third of the esophagus. This was advanced into the stomach using air insufflation and gentle endoscopic pressure.- Small hiatal hernia. - Benign-appearing esophageal stenosis. - Normal stomach. - Normal examined duodenum. - No specimens collected.  Recommendations:  Repeat EGD for esophageal dilation  Past Medical History:  Diagnosis Date   DM (diabetes mellitus) (HCC)    GERD (gastroesophageal reflux disease)    Gout    Hyperlipidemia    Hypertension     Past Surgical History:  Procedure Laterality Date   ESOPHAGOGASTRODUODENOSCOPY N/A 11/07/2020   Procedure: ESOPHAGOGASTRODUODENOSCOPY (EGD);  Surgeon: 11/09/20, DO;  Location: [facility] ENDOSCOPY;  Service: Gastroenterology;  Laterality: N/A;   FOREIGN BODY REMOVAL  11/07/2020   Procedure: FOREIGN BODY REMOVAL;  Surgeon: [facility], DO;  Location: [facility] ENDOSCOPY;  Service: Gastroenterology;;   REVERSE SHOULDER ARTHROPLASTY Right 09/17/2020  Procedure: REVERSE SHOULDER ARTHROPLASTY;  Surgeon: Bjorn Pippin, MD;  Location: [facility];  Service: Orthopedics;  Laterality: Right;    Current Outpatient Medications  Medication Sig Dispense Refill   ASPERCREME LIDOCAINE 4 % CREA Apply 1 application topically as needed (for pain).     atorvastatin (LIPITOR) 20 MG tablet Take 20 mg by mouth at bedtime.     colchicine-probenecid 0.5-500 MG  tablet Take 1 tablet by mouth daily.     CONTOUR NEXT TEST test strip 4 (four) times daily. for testing     docusate sodium (COLACE) 100 MG capsule Take 1 capsule (100 mg total) by mouth 2 (two) times daily. 10 capsule 0   furosemide (LASIX) 40 MG tablet Take 40 mg by mouth 2 (two) times daily.     GLOBAL EASE INJECT PEN NEEDLES 31G X 5 MM MISC 4 (four) times daily.     JARDIANCE 25 MG TABS tablet Take 12.5 mg by mouth in the morning.     NOVOLOG FLEXPEN 100 UNIT/ML FlexPen Inject 16 Units into the skin See admin instructions. Inject 16 units into thee skin two times a day before meals, per sliding scale     omeprazole (PRILOSEC) 20 MG capsule Take 20 mg by mouth daily before breakfast.     omeprazole (PRILOSEC) 20 MG capsule Take 1 capsule (20 mg total) by mouth daily. To gastric protection while taking NSAIDs 30 capsule 0   pantoprazole (PROTONIX) 40 MG tablet Take 1 tablet (40 mg total) by mouth 2 (two) times daily. 90 tablet 3   polyethylene glycol (MIRALAX / GLYCOLAX) 17 g packet Take 17 g by mouth daily. 14 each 0   sucralfate (CARAFATE) 1 GM/10ML suspension Take 10 mLs (1 g total) by mouth 4 (four) times daily. 420 mL 3   traMADol (ULTRAM) 50 MG tablet Take 50 mg by mouth 3 (three) times daily as needed.     TRESIBA FLEXTOUCH 200 UNIT/ML SOPN Inject 52 Units into the skin every evening.     triamcinolone cream (KENALOG) 0.1 % Apply 1 application topically 3 (three) times daily as needed (for itching).     No current facility-administered medications for this visit.    Allergies as of 02/02/2021 - Review Complete 02/02/2021  Allergen Reaction Noted   Lisinopril Diarrhea 10/24/2018   Metformin Diarrhea 01/21/2015    Family History  Problem Relation Age of Onset   Heart disease Father     Social History   Socioeconomic History   Marital status: Married    Spouse name: Not on file   Number of children: Not on file   Years of education: Not on file   Highest education level:  Not on file  Occupational History   Not on file  Tobacco Use   Smoking status: Former    Packs/day: 1.00    Years: 2.00    Pack years: 2.00    Types: Cigarettes    Quit date: 04/20/1975    Years since quitting: 45.8   Smokeless tobacco: Never  Substance and Sexual Activity   Alcohol use: Never   Drug use: Never   Sexual activity: Not on file  Other Topics Concern   Not on file  Social History Narrative   Not on file   Social Determinants of Health   Financial Resource Strain: Not on file  Food Insecurity: Not on file  Transportation Needs: Not on file  Physical Activity: Not on file  Stress: Not on file  Social  Connections: Not on file   Review of Systems: Gen: Denies fever, chills, anorexia. Denies fatigue, weakness, weight loss.  CV: Denies chest pain, palpitations, syncope, peripheral edema, and claudication. Resp: Denies dyspnea at rest, cough, wheezing, coughing up blood, and pleurisy. GI: Denies denies melena, hematochezia, nausea, vomiting, constipation, dysphagia, odyonophagia, early satiety or weight loss. +diarrhea twice/week Derm: Denies rash, itching, dry skin Psych: Denies depression, anxiety, memory loss, confusion. No homicidal or suicidal ideation.  Heme: Denies bruising, bleeding, and enlarged lymph nodes.  Physical Exam: There were no vitals taken for this visit. General:   Alert and oriented. No distress noted. Pleasant and cooperative.  Head:  Normocephalic and atraumatic. Eyes:  Conjuctiva clear without scleral icterus. Mouth:  Oral mucosa pink and moist. Good dentition. No lesions. Heart: Normal rate and rhythm, s1 and s2 heart sounds present.  Lungs: Clear lung sounds in all lobes. Respirations equal and unlabored. Abdomen:  +BS, soft, non-tender and non-distended. No rebound or guarding. No HSM or masses noted. Derm: No palmar erythema or jaundice Msk:  Symmetrical without gross deformities. Normal posture. Extremities:  Without  edema. Neurologic:  Alert and  oriented x4 Psych:  Alert and cooperative. Normal mood and affect.  Invalid input(s): 6 MONTHS   ASSESSMENT: Sheila [name] is a 70 y.o. female presenting today for follow up of dysphagia after having food impact in July 2022 requiring transfer to [facility] hospital for emergent EGD to remove food bolus, r/t requirements of higher anesthesia care.  Patient was supposed to have repeat EGD for esophageal dilation in August, however, she reports she was never made aware she was scheduled for the procedure until she got a reminder for it and she had to cancel due to her husband being in poor health. She reports no further issues with dysphagia since, however, she takes very small bites, chews thoroughly and avoids meats and thicker foods. She denies any issues with pill dysphagia or liquids. She is taking protonix 40mg  once daily, in the evenings. She is still experiencing some reflux and acid regurgitation a few times per week. Protonix was initially prescribed by Dr. BID, however, patient reports she was unaware she was supposed to take it BID, we will increase it back to twice daily. If reflux is not improved with twice daily dosing, she will let me know, she should continue chewing thoroughly, taking small bites and avoiding thick foods until after esophageal dilation.   She reports having diarrhea maybe twice per week, states this has been ongoing for some time, she has some associated LLQ pain prior to defecating sometimes. She denies fevers, chills, nausea or vomiting, no melena or hematochezia. Symptoms appear chronic, are likely related to underlying IBS. We discussed bentyl as needed for abdominal pain, however, patient did not feel that she needed any medication for her symptoms at this time, she can take imodium over the counter as needed. She typically drinks ginger ale or takes pepto bismol occasionally which provides good results. If diarrhea becomes more  frequent or she has worsening abdominal pain, melena or hematochezia, she will let me know.   PLAN:  Continue small bites, chewing thoroughly 2. Schedule EGD for esophageal dilation 3. Continue protonix 40mg  BID 4. Imodium as needed for diarrhea  Follow Up: 3 months  Alexie Samson L. Barron Alvine, MSN, APRN, AGNP-C Adult-Gerontology Nurse Practitioner [facility]

## 2021-02-09 ENCOUNTER — Ambulatory Visit (INDEPENDENT_AMBULATORY_CARE_PROVIDER_SITE_OTHER): Payer: Medicare Other | Admitting: Podiatry

## 2021-02-09 ENCOUNTER — Other Ambulatory Visit: Payer: Self-pay

## 2021-02-09 DIAGNOSIS — M14671 Charcot's joint, right ankle and foot: Secondary | ICD-10-CM

## 2021-02-09 MED ORDER — TRAMADOL HCL 50 MG PO TABS: 50.0000 mg | ORAL_TABLET | ORAL | 0 refills | Status: DC | PRN

## 2021-02-09 NOTE — Progress Notes (Signed)
   HPI: 70 y.o. female presenting today for acute pain to the right foot.  Patient has a longstanding history of Charcot to the right foot.  She states that she heard a pop and she has had significant pain to the lateral aspect of the right foot.  She denies a history of trauma.  She presents for further treatment and evaluation.  She has tried an AFO brace in the past with no relief  Past Medical History:  Diagnosis Date   DM (diabetes mellitus) (HCC)    GERD (gastroesophageal reflux disease)    Gout    Hyperlipidemia    Hypertension      Physical Exam: General: The patient is alert and oriented x3 in no acute distress.  Dermatology: Preulcerative callus lesions noted along the medial longitudinal arch of the right foot secondary to the Charcot foot  Vascular: Palpable pedal pulses bilaterally. No edema or erythema noted. Capillary refill within normal limits.  Neurological: Epicritic and protective threshold grossly intact bilaterally.   Musculoskeletal Exam: Pes planus deformity noted with a rigid Charcot foot right.  There is significant pain on palpation throughout the lateral aspect of the foot and lateral rear foot.  Radiographic Exam:  Tarsal and midtarsal joint collapse.  Diffuse degenerative changes noted throughout the tarsal bones of the foot.  Findings consistent with a chronic Charcot of the right foot.  No acute fractures identified although the bones are hard to visualize due to the chronic degenerative changes  Assessment: 1.  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  Pes planus right 3.  Acute pain right foot   Plan of Care:  1. Patient evaluated. X-Rays reviewed.  2.  Today were going to place the patient in an immobilization cam boot weightbearing as tolerated x4 weeks 3.  Prescription for tramadol 50 mg every 4 hours #30 4.  Return to clinic in 6 weeks.  If the patient is feeling better we may need to discuss possible  molding for a CROW boot      Felecia Shelling, DPM [facility]  Dr. Felecia Shelling, DPM    [address] [place], [place] [address]                Office [phone]  Fax [phone]

## 2021-02-11 ENCOUNTER — Other Ambulatory Visit (HOSPITAL_COMMUNITY)
Admission: RE | Admit: 2021-02-11 | Discharge: 2021-02-11 | Disposition: A | Discharge status: Home / Self Care | Payer: Medicare Other | Source: Ambulatory Visit | Attending: Gastroenterology | Admitting: Gastroenterology

## 2021-02-11 DIAGNOSIS — Z01812 Encounter for preprocedural laboratory examination: Secondary | ICD-10-CM | POA: Insufficient documentation

## 2021-02-11 DIAGNOSIS — I119 Hypertensive heart disease without heart failure: Secondary | ICD-10-CM | POA: Diagnosis not present

## 2021-02-11 LAB — BASIC METABOLIC PANEL
Anion gap: 11 (ref 5–15)
BUN: 31 mg/dL — ABNORMAL HIGH (ref 8–23)
CO2: 22 mmol/L (ref 22–32)
Calcium: 9.2 mg/dL (ref 8.9–10.3)
Chloride: 103 mmol/L (ref 98–111)
Creatinine, Ser: 1.33 mg/dL — ABNORMAL HIGH (ref 0.44–1.00)
GFR, Estimated: 44 mL/min — ABNORMAL LOW (ref >60)
Glucose, Bld: 156 mg/dL — ABNORMAL HIGH (ref 70–99)
Potassium: 4 mmol/L (ref 3.5–5.1)
Sodium: 136 mmol/L (ref 135–145)

## 2021-02-13 ENCOUNTER — Ambulatory Visit (HOSPITAL_COMMUNITY)
Admission: RE | Admit: 2021-02-13 | Discharge: 2021-02-13 | Disposition: A | Discharge status: Home / Self Care | Payer: Medicare Other | Attending: Gastroenterology | Admitting: Gastroenterology

## 2021-02-13 ENCOUNTER — Other Ambulatory Visit: Payer: Self-pay

## 2021-02-13 ENCOUNTER — Ambulatory Visit (HOSPITAL_COMMUNITY): Payer: Medicare Other | Admitting: Anesthesiology

## 2021-02-13 ENCOUNTER — Encounter (HOSPITAL_COMMUNITY)
Admission: RE | Disposition: A | Discharge status: Home / Self Care | Payer: Self-pay | Source: Home / Self Care | Attending: Gastroenterology

## 2021-02-13 ENCOUNTER — Encounter (HOSPITAL_COMMUNITY): Payer: Self-pay | Admitting: Gastroenterology

## 2021-02-13 DIAGNOSIS — E785 Hyperlipidemia, unspecified: Secondary | ICD-10-CM | POA: Diagnosis not present

## 2021-02-13 DIAGNOSIS — K3189 Other diseases of stomach and duodenum: Secondary | ICD-10-CM

## 2021-02-13 DIAGNOSIS — E119 Type 2 diabetes mellitus without complications: Secondary | ICD-10-CM | POA: Insufficient documentation

## 2021-02-13 DIAGNOSIS — K219 Gastro-esophageal reflux disease without esophagitis: Secondary | ICD-10-CM | POA: Diagnosis not present

## 2021-02-13 DIAGNOSIS — G4730 Sleep apnea, unspecified: Secondary | ICD-10-CM | POA: Diagnosis not present

## 2021-02-13 DIAGNOSIS — K222 Esophageal obstruction: Secondary | ICD-10-CM | POA: Insufficient documentation

## 2021-02-13 DIAGNOSIS — Z79899 Other long term (current) drug therapy: Secondary | ICD-10-CM | POA: Diagnosis not present

## 2021-02-13 DIAGNOSIS — I10 Essential (primary) hypertension: Secondary | ICD-10-CM | POA: Insufficient documentation

## 2021-02-13 DIAGNOSIS — Z888 Allergy status to other drugs, medicaments and biological substances status: Secondary | ICD-10-CM | POA: Diagnosis not present

## 2021-02-13 DIAGNOSIS — J9601 Acute respiratory failure with hypoxia: Secondary | ICD-10-CM | POA: Diagnosis not present

## 2021-02-13 DIAGNOSIS — Z7984 Long term (current) use of oral hypoglycemic drugs: Secondary | ICD-10-CM | POA: Diagnosis not present

## 2021-02-13 DIAGNOSIS — Z794 Long term (current) use of insulin: Secondary | ICD-10-CM | POA: Diagnosis not present

## 2021-02-13 DIAGNOSIS — Z87891 Personal history of nicotine dependence: Secondary | ICD-10-CM | POA: Diagnosis not present

## 2021-02-13 DIAGNOSIS — K319 Disease of stomach and duodenum, unspecified: Secondary | ICD-10-CM | POA: Diagnosis not present

## 2021-02-13 DIAGNOSIS — K449 Diaphragmatic hernia without obstruction or gangrene: Secondary | ICD-10-CM | POA: Insufficient documentation

## 2021-02-13 HISTORY — PX: BIOPSY: SHX5522

## 2021-02-13 HISTORY — PX: ESOPHAGEAL DILATION: SHX303

## 2021-02-13 HISTORY — PX: ESOPHAGOGASTRODUODENOSCOPY (EGD) WITH PROPOFOL: SHX5813

## 2021-02-13 LAB — GLUCOSE, CAPILLARY: Glucose-Capillary: 123 mg/dL — ABNORMAL HIGH (ref 70–99)

## 2021-02-13 SURGERY — ESOPHAGOGASTRODUODENOSCOPY (EGD) WITH PROPOFOL
Anesthesia: General

## 2021-02-13 MED ORDER — GLYCOPYRROLATE 0.2 MG/ML IJ SOLN
INTRAMUSCULAR | Status: DC | PRN
  Administered 2021-02-13: .1 mg via INTRAVENOUS

## 2021-02-13 MED ORDER — LACTATED RINGERS IV SOLN: INTRAVENOUS | Status: DC

## 2021-02-13 MED ORDER — KETAMINE HCL 50 MG/5ML IJ SOSY
PREFILLED_SYRINGE | INTRAMUSCULAR | Status: AC
  Filled 2021-02-13: qty 5

## 2021-02-13 MED ORDER — GLYCOPYRROLATE PF 0.2 MG/ML IJ SOSY
PREFILLED_SYRINGE | INTRAMUSCULAR | Status: AC
  Filled 2021-02-13: qty 3

## 2021-02-13 MED ORDER — PROPOFOL 500 MG/50ML IV EMUL
INTRAVENOUS | Status: AC
  Filled 2021-02-13: qty 100

## 2021-02-13 MED ORDER — LIDOCAINE VISCOUS HCL 2 % MT SOLN: 15.0000 mL | Freq: Once | OROMUCOSAL | Status: AC

## 2021-02-13 MED ORDER — PANTOPRAZOLE SODIUM 40 MG PO TBEC: 40.0000 mg | DELAYED_RELEASE_TABLET | Freq: Every day | ORAL | 3 refills | Status: DC

## 2021-02-13 MED ORDER — LIDOCAINE HCL (CARDIAC) PF 100 MG/5ML IV SOSY
PREFILLED_SYRINGE | INTRAVENOUS | Status: DC | PRN
  Administered 2021-02-13: 50 mg via INTRAVENOUS

## 2021-02-13 MED ORDER — LIDOCAINE HCL (PF) 2 % IJ SOLN
INTRAMUSCULAR | Status: AC
  Filled 2021-02-13: qty 5

## 2021-02-13 MED ORDER — LIDOCAINE VISCOUS HCL 2 % MT SOLN
OROMUCOSAL | Status: AC
  Administered 2021-02-13: 15 mL via OROMUCOSAL
  Filled 2021-02-13: qty 15

## 2021-02-13 MED ORDER — KETAMINE HCL 10 MG/ML IJ SOLN
INTRAMUSCULAR | Status: DC | PRN
  Administered 2021-02-13: 20 mg via INTRAVENOUS

## 2021-02-13 MED ORDER — PROPOFOL 10 MG/ML IV BOLUS
INTRAVENOUS | Status: AC
  Filled 2021-02-13: qty 20

## 2021-02-13 MED ORDER — PROPOFOL 10 MG/ML IV BOLUS
INTRAVENOUS | Status: DC | PRN
  Administered 2021-02-13: 80 mg via INTRAVENOUS
  Administered 2021-02-13 (×2): 30 mg via INTRAVENOUS

## 2021-02-13 NOTE — Anesthesia Preprocedure Evaluation (Signed)
Anesthesia Evaluation  Patient identified by MRN, date of birth, ID band Patient awake    Reviewed: Allergy & Precautions, NPO status , Patient's Chart, lab work & pertinent test results  History of Anesthesia Complications (+) history of anesthetic complications (pneumothorax ?)  Airway Mallampati: II  TM Distance: >3 FB Neck ROM: Full    Dental  (+) Dental Advisory Given, Teeth Intact   Pulmonary shortness of breath and with exertion, asthma , sleep apnea (loud snoring) , former smoker,  Pneumothorax - after shoulder sx   Pulmonary exam normal breath sounds clear to auscultation       Cardiovascular Exercise Tolerance: Good hypertension, Pt. on medications Normal cardiovascular exam Rhythm:Regular Rate:Normal     Neuro/Psych negative neurological ROS  negative psych ROS   GI/Hepatic Neg liver ROS, hiatal hernia, GERD  Medicated,  Endo/Other  diabetes, Well Controlled, Type 2, Oral Hypoglycemic Agents  Renal/GU      Musculoskeletal  (+) Arthritis , Osteoarthritis,    Abdominal   Peds  Hematology negative hematology ROS (+)   Anesthesia Other Findings   Reproductive/Obstetrics                             Anesthesia Physical Anesthesia Plan  ASA: 3  Anesthesia Plan: General   Post-op Pain Management:    Induction: Intravenous  PONV Risk Score and Plan: TIVA  Airway Management Planned: Nasal Cannula and Natural Airway  Additional Equipment:   Intra-op Plan:   Post-operative Plan:   Informed Consent: I have reviewed the patients History and Physical, chart, labs and discussed the procedure including the risks, benefits and alternatives for the proposed anesthesia with the patient or authorized representative who has indicated his/her understanding and acceptance.     Dental advisory given  Plan Discussed with: CRNA and Surgeon  Anesthesia Plan Comments:          Anesthesia Quick Evaluation

## 2021-02-13 NOTE — Transfer of Care (Signed)
Immediate Anesthesia Transfer of Care Note  Patient: Sheila [name]  Procedure(s) Performed: ESOPHAGOGASTRODUODENOSCOPY (EGD) WITH PROPOFOL ESOPHAGEAL DILATION BIOPSY  Patient Location: Endoscopy Unit  Anesthesia Type:General  Level of Consciousness: awake  Airway & Oxygen Therapy: Patient Spontanous Breathing  Post-op Assessment: Report given to RN and Post -op Vital signs reviewed and stable  Post vital signs: Reviewed and stable  Last Vitals:  Vitals Value Taken Time  BP    Temp    Pulse    Resp    SpO2      Last Pain:  Vitals:   02/13/21 1323  PainSc: 0-No pain      Patients Stated Pain Goal: 8 (02/13/21 1323)  Complications: No notable events documented.

## 2021-02-13 NOTE — Interval H&P Note (Signed)
History and Physical Interval Note:  02/13/2021 1:41 PM Sheila [name] is a 70 y.o. female with past medical history of DM, GERD, HLD, HTN, coming for follow-up of dysphagia and episode of food impaction.  Patient denies having any dysphagia.  Has not tried eating any meat but has not had any more issues since her last episode of food impaction.  She has evidence of a stenosis in the distal esophagus.  She is taking PPI twice daily.  BP 139/69   Pulse 70   Temp 98.4 F (36.9 C)   Resp 16   Ht 5' (1.524 m)   Wt 86.2 kg   SpO2 98%   BMI 37.11 kg/m  GENERAL: The patient is AO x3, in no acute distress. HEENT: Head is normocephalic and atraumatic. EOMI are intact. Mouth is well hydrated and without lesions. NECK: Supple. No masses LUNGS: Clear to auscultation. No presence of rhonchi/wheezing/rales. Adequate chest expansion HEART: RRR, normal s1 and s2. ABDOMEN: Soft, nontender, no guarding, no peritoneal signs, and nondistended. BS +. No masses. EXTREMITIES: Without any cyanosis, clubbing, rash, lesions or edema. NEUROLOGIC: AOx3, no focal motor deficit. SKIN: no jaundice, no rashes  [name]  has presented today for surgery, with the diagnosis of Esophageal Stricture.  The various methods of treatment have been discussed with the patient and family. After consideration of risks, benefits and other options for treatment, the patient has consented to  Procedure(s): ESOPHAGOGASTRODUODENOSCOPY (EGD) WITH PROPOFOL (N/A) ESOPHAGEAL DILATION (N/A) as a surgical intervention.  The patient's history has been reviewed, patient examined, no change in status, stable for surgery.  I have reviewed the patient's chart and labs.  Questions were answered to the patient's satisfaction.     [name]

## 2021-02-13 NOTE — Anesthesia Postprocedure Evaluation (Signed)
Anesthesia Post Note  Patient: Sheila [name]  Procedure(s) Performed: ESOPHAGOGASTRODUODENOSCOPY (EGD) WITH PROPOFOL ESOPHAGEAL DILATION BIOPSY  Patient location during evaluation: Endoscopy Anesthesia Type: General Level of consciousness: awake and alert and oriented Pain management: pain level controlled Vital Signs Assessment: post-procedure vital signs reviewed and stable Respiratory status: spontaneous breathing, nonlabored ventilation and respiratory function stable Cardiovascular status: blood pressure returned to baseline and stable Postop Assessment: no apparent nausea or vomiting Anesthetic complications: no   No notable events documented.   Last Vitals:  Vitals:   02/13/21 1455 02/13/21 1500  BP: (!) 85/55 97/82  Pulse: 79   Resp: (!) 22   Temp:    SpO2: 95%     Last Pain:  Vitals:   02/13/21 1455  PainSc: 0-No pain                 Sebastian Lurz C Sanaiya Welliver

## 2021-02-13 NOTE — Op Note (Signed)
[facility] Patient Name: Sheila [name] Procedure Date: 02/13/2021 2:25 PM MRN: [id number] Date of Birth: 02-26-1951 Attending MD: Katrinka Blazing ,  CSN: [id number] Age: 70 Admit Type: Outpatient Procedure:                Upper GI endoscopy Indications:              Follow-up of esophageal stricture Providers:                Katrinka Blazing, Jannett Celestine, RN, Edythe Clarity,                            Technician Referring MD:              Medicines:                Monitored Anesthesia Care Complications:            No immediate complications. Estimated Blood Loss:     Estimated blood loss: none. Procedure:                Pre-Anesthesia Assessment:                           - Prior to the procedure, a History and Physical                            was performed, and patient medications, allergies                            and sensitivities were reviewed. The patient's                            tolerance of previous anesthesia was reviewed.                           - The risks and benefits of the procedure and the                            sedation options and risks were discussed with the                            patient. All questions were answered and informed                            consent was obtained.                           - ASA Grade Assessment: II - A patient with mild                            systemic disease.                           After obtaining informed consent, the endoscope was                            passed under direct vision. Throughout the  procedure, the patient's blood pressure, pulse, and                            oxygen saturations were monitored continuously. The                            GIF-H190 ([id number]) scope was introduced through the                            mouth, and advanced to the second part of duodenum.                            The upper GI endoscopy was accomplished without                             difficulty. The patient tolerated the procedure                            well. Scope In: Scope Out: 2:50:18 PM Findings:      One benign-appearing, intrinsic mild (non-circumferential scarring)       stenosis was found at the gastroesophageal junction. This stenosis       measured 1.2 cm (inner diameter) x 1 cm (in length). The stenosis was       traversed. A TTS dilator was passed through the scope. Dilation with a       12-13.5-15 mm balloon dilator was performed to 15 mm.      Five localized medium erosions with no stigmata of recent bleeding were       found in the gastric body. Biopsies from the erosions were taken with a       cold forceps for histology. Biopsies from body and antrum were taken       with a cold forceps for Helicobacter pylori testing.      The examined duodenum was normal. Impression:               - Benign-appearing esophageal stenosis. Dilated.                           - Erosive gastropathy with no stigmata of recent                            bleeding. Biopsied.                           - Normal examined duodenum. Moderate Sedation:      Per Anesthesia Care Recommendation:           - Discharge patient to home (ambulatory).                           - Resume previous diet, advanced as tolerated.                           - Await pathology results.                           - Decrease Protonix (pantoprazole)  to 40 mg PO                            daily. Procedure Code(s):        --- Professional ---                           [phone], Esophagogastroduodenoscopy, flexible,                            transoral; with transendoscopic balloon dilation of                            esophagus (less than 30 mm diameter)                           43239, 59, Esophagogastroduodenoscopy, flexible,                            transoral; with biopsy, single or multiple Diagnosis Code(s):        --- Professional ---                           K22.2, Esophageal  obstruction                           K31.89, Other diseases of stomach and duodenum CPT copyright 2019 American Medical Association. All rights reserved. The codes documented in this report are preliminary and upon coder review may  be revised to meet current compliance requirements. Katrinka Blazing, MD Katrinka Blazing,  02/13/2021 2:56:01 PM This report has been signed electronically. Number of Addenda: 0

## 2021-02-13 NOTE — Discharge Instructions (Signed)
You are being discharged to home.  Resume your previous diet, advanced as tolerated. We are waiting for your pathology results.  Decrease Protonix (pantoprazole) 40 mg by mouth once a day.

## 2021-02-16 DIAGNOSIS — E119 Type 2 diabetes mellitus without complications: Secondary | ICD-10-CM | POA: Diagnosis not present

## 2021-02-17 ENCOUNTER — Encounter (HOSPITAL_COMMUNITY): Payer: Self-pay | Admitting: Gastroenterology

## 2021-02-18 LAB — SURGICAL PATHOLOGY

## 2021-02-19 ENCOUNTER — Telehealth: Payer: Self-pay | Admitting: *Deleted

## 2021-02-19 NOTE — Telephone Encounter (Signed)
Patient is calling she is having a difficult time walking in the boot given, causing heel pain. She would like physician's advise on getting a knee scooter or rollator walker would it be beneficial?

## 2021-02-20 ENCOUNTER — Other Ambulatory Visit: Payer: Self-pay | Admitting: Podiatry

## 2021-02-20 DIAGNOSIS — M14671 Charcot's joint, right ankle and foot: Secondary | ICD-10-CM

## 2021-02-20 NOTE — Telephone Encounter (Signed)
I'm placing an order in her chart for a knee scooter now. Could we please arrange.  Dx: charcot neuroarthropathy.

## 2021-02-20 NOTE — Telephone Encounter (Signed)
Patient is calling 2nd time for advise on getting a knee scooter or a rollator walker. Please contact.

## 2021-02-25 ENCOUNTER — Other Ambulatory Visit: Payer: Self-pay | Admitting: *Deleted

## 2021-02-25 NOTE — Telephone Encounter (Signed)
She really needs a knee scooter. She can generally purchase one online or used off of '[employer]'. Please let patient know. - Dr. Logan Bores

## 2021-02-25 NOTE — Telephone Encounter (Signed)
Faxed order to [facility] in [place] for knee scooter, consider a luxury, not covered by insurance.received confirmation 02/25/21,patient notified.  She wanted to also let the doctor know that her heel has been very painful,struggling for about a week w/boot and walking in it. Please advise.

## 2021-02-27 NOTE — Telephone Encounter (Signed)
I am calling you on behalf of Dr. Logan Bores he said, "you really need a knee scooter. You can generally purchase a used one online or off of '[employer]."  Please give [place] a call on Monday if you have any further questions or concerns.

## 2021-03-05 ENCOUNTER — Other Ambulatory Visit: Payer: Self-pay | Admitting: Podiatry

## 2021-03-05 ENCOUNTER — Telehealth: Payer: Self-pay | Admitting: *Deleted

## 2021-03-05 MED ORDER — TRAMADOL HCL 50 MG PO TABS
50.0000 mg | ORAL_TABLET | Freq: Three times a day (TID) | ORAL | 0 refills | Status: AC | PRN
Start: 2021-03-05 — End: 2021-03-10

## 2021-03-05 NOTE — Telephone Encounter (Signed)
Patient is calling because she fell off her knee scooter purchased, husband gave her a Rolator walker, now she is in so much pain, unable to put pressure on her foot and would like a prescription for a refill of (Tramadol-50mg ), only 5 left.

## 2021-03-06 NOTE — Telephone Encounter (Signed)
Patient fell off knee scooter and now is requesting a rollator. Please advise.

## 2021-03-10 ENCOUNTER — Ambulatory Visit: Payer: Medicare Other | Admitting: Podiatry

## 2021-03-16 ENCOUNTER — Ambulatory Visit: Payer: Medicare Other

## 2021-03-16 ENCOUNTER — Ambulatory Visit (INDEPENDENT_AMBULATORY_CARE_PROVIDER_SITE_OTHER): Payer: Medicare Other | Admitting: Podiatry

## 2021-03-16 ENCOUNTER — Other Ambulatory Visit: Payer: Self-pay

## 2021-03-16 DIAGNOSIS — M216X1 Other acquired deformities of right foot: Secondary | ICD-10-CM

## 2021-03-16 DIAGNOSIS — M2141 Flat foot [pes planus] (acquired), right foot: Secondary | ICD-10-CM

## 2021-03-16 DIAGNOSIS — M24274 Disorder of ligament, right foot: Secondary | ICD-10-CM

## 2021-03-16 DIAGNOSIS — M14671 Charcot's joint, right ankle and foot: Secondary | ICD-10-CM | POA: Diagnosis not present

## 2021-03-16 DIAGNOSIS — M21371 Foot drop, right foot: Secondary | ICD-10-CM

## 2021-03-16 DIAGNOSIS — M25571 Pain in right ankle and joints of right foot: Secondary | ICD-10-CM

## 2021-03-16 DIAGNOSIS — M24271 Disorder of ligament, right ankle: Secondary | ICD-10-CM

## 2021-03-16 DIAGNOSIS — M24871 Other specific joint derangements of right ankle, not elsewhere classified: Secondary | ICD-10-CM

## 2021-03-17 ENCOUNTER — Telehealth: Payer: Self-pay | Admitting: *Deleted

## 2021-03-17 ENCOUNTER — Other Ambulatory Visit: Payer: Self-pay | Admitting: Podiatry

## 2021-03-17 DIAGNOSIS — I10 Essential (primary) hypertension: Secondary | ICD-10-CM | POA: Diagnosis not present

## 2021-03-17 DIAGNOSIS — Z299 Encounter for prophylactic measures, unspecified: Secondary | ICD-10-CM | POA: Diagnosis not present

## 2021-03-17 DIAGNOSIS — E11610 Type 2 diabetes mellitus with diabetic neuropathic arthropathy: Secondary | ICD-10-CM | POA: Diagnosis not present

## 2021-03-17 DIAGNOSIS — E1165 Type 2 diabetes mellitus with hyperglycemia: Secondary | ICD-10-CM | POA: Diagnosis not present

## 2021-03-17 DIAGNOSIS — N1832 Chronic kidney disease, stage 3b: Secondary | ICD-10-CM | POA: Diagnosis not present

## 2021-03-17 MED ORDER — TRAMADOL HCL 50 MG PO TABS: 50.0000 mg | ORAL_TABLET | Freq: Every evening | ORAL | 0 refills | Status: AC | PRN

## 2021-03-17 NOTE — Telephone Encounter (Signed)
Patient is calling for a refill of her Sheila [name] not been received at pharmacy. Please advise.

## 2021-03-17 NOTE — Telephone Encounter (Signed)
Prescription sent

## 2021-03-18 ENCOUNTER — Other Ambulatory Visit: Payer: Self-pay | Admitting: Podiatry

## 2021-03-18 DIAGNOSIS — E119 Type 2 diabetes mellitus without complications: Secondary | ICD-10-CM | POA: Diagnosis not present

## 2021-03-18 DIAGNOSIS — Z23 Encounter for immunization: Secondary | ICD-10-CM | POA: Diagnosis not present

## 2021-03-18 NOTE — Progress Notes (Signed)
SITUATION Patient Name:  Sheila [name] MRN:   [id number] Reason for Visit: Evaluation for Speciality AFO  Patient Report: Chief Complaint:   Pain in foot [name] of Discomfort/Pain:  Ambulatory Standing Resting Location:    right lower extremity Onset & Duration:   Present longer than 3 months Course:    stable Aggravating or Alleviating Factors: None  OBJECTIVE DATA & MEASUREMENTS Prognosis:    Good Duration of use:   5 years  Diagnosis: Acquired right foot drop  Disorder of ligament of right ankle  Disorder of ligament of right foot  Flat foot (pes planus) (acquired), right foot  Other acquired deformities of right foot  Other specific joint derangements of right ankle, not elsewhere classified  Pain in right ankle and joints of right foot  GOALS, NECESSITIES, & JUSTIFICATIONS Recommended Device: [place] AFO EC Neurowalker Color:    Black Closure:   Nature conservation officer Code Description Justification  right [id number] Plastic orthosis, custom molded from a model of the patient, custom fabricated, includes casting and cast preparation. Necessary to provide triplanar support to the foot/ankle complex  right L2330 Addition to lower extremity, lacer molded to patient model Necessary to ensure secure hold of orthosis to patient's limb  right L2820 Addition to lower extremity orthosis, soft interface for molded plastic below knee section Necessary to relieve pressure on bony prominences  right L3400 Metatarsal bar wedge, rocker Necessary to allow for roll-over in single limb stance with a locked ankle  right L3230 Orthopedic footwear, custom shoe, depth inlay, each Necessary to allow device to ambulate on indoor/outdoor surfaces   I certify that Anisten Tomassi qualifies for and will benefit from an ankle foot orthosis used during ambulation based on meeting all of the following criteria;   The patient is: - Ambulatory, and - Has weakness or deformity of the foot and ankle, and -  Requires stabilization for medical reasons, and - Has the potential to benefit functionally  The patient's medical record contains sufficient documentation of the patients medical condition to substantiate the necessity for the type and quantity of the items ordered.  The goals of this therapy: - Improve Mobility - Improve Lower Extremity Stability - Decrease Pain - Facilitate Soft Tissue Healing - Facilitate Immobilization, healing and treatment of an injury  Necessity of Ankle Foot Orthotic molded to patient model: A custom (vs. prefabricated) ankle foot orthosis has been prescribed based on the following criteria which are specific to the condition of this patient; - The patient could not be fit with a prefabricated AFO - The condition necessitating the orthosis is expected to be permanent or of longstanding duration (more than 6 months) - There is need to control the ankle or foot in more than one plane - The patient has a documented neurological, circulatory, or orthopedic condition that requires custom fabrication over a model to prevent tissue injury - The patient has a healing fracture that lacks normal anatomical integrity or anthropometric proportions  I hereby certify that the ankle foot orthotic described above is a rigid or semi-rigid device which is used for the purpose of supporting a weak or deformed body member or restricting or eliminating motion in a diseased or injured part of the body. It is designed to provide support and counterforce on the limb or body part that is being braced. In my opinion, the custom molded ankle foot orthosis is both reasonable and necessary in reference to accepted standards of medical practice in the treatment  of the patient  condition and rehabilitation.  ACTIONS PERFORMED Patient was evaluated and casted for Specialty AFO via [employer]. Procedure was explained to patient. Patient tolerated procedure. patient selected device color and  closure method.   PLAN Patient to return in four to six weeks for fitting and delivery of device. Plan of care was explained to and agreed upon by patient. All questions were answered and concerns addressed.

## 2021-03-18 NOTE — Telephone Encounter (Signed)
Patient has been notified

## 2021-03-19 ENCOUNTER — Ambulatory Visit (INDEPENDENT_AMBULATORY_CARE_PROVIDER_SITE_OTHER): Payer: Medicare Other | Admitting: Gastroenterology

## 2021-03-23 ENCOUNTER — Ambulatory Visit: Payer: Medicare Other | Admitting: Podiatry

## 2021-03-23 NOTE — Progress Notes (Signed)
   HPI: 69 y.o. female presenting today for follow-up evaluation of longstanding history of Charcot to the right foot.  Patient states that about 2 weeks ago she did have some an increased amount of pain.  Currently she is not able to apply pressure on the foot.  Over the past 2 weeks there has been significant improvement.  Last visit on 02/09/2021 we did discuss molding for a [employer]	[place].  She presents today for follow-up treatment and evaluation  Past Medical History:  Diagnosis Date   DM (diabetes mellitus) (HCC)    GERD (gastroesophageal reflux disease)    Gout    Hyperlipidemia    Hypertension      Physical Exam: General: The patient is alert and oriented x3 in no acute distress.  Dermatology: Preulcerative callus lesions noted along the medial longitudinal arch of the right foot secondary to the Charcot foot.  There is no open wound  Vascular: Palpable pedal pulses bilaterally. No edema or erythema noted. Capillary refill within normal limits.  Neurological: Epicritic and protective threshold grossly intact bilaterally.   Musculoskeletal Exam: Pes planus deformity noted with a rigid Charcot foot right.  There is significant pain on palpation throughout the lateral aspect of the foot and lateral rear foot.  Radiographic Exam 09/05/2019 B/L feet:  Tarsal and midtarsal joint collapse.  Diffuse degenerative changes noted throughout the tarsal bones of the foot.  Findings consistent with a chronic Charcot of the right foot.  No acute fractures identified although the bones are hard to visualize due to the chronic degenerative changes  Assessment: 1.  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  Pes planus right 3.  Acute pain right foot   Plan of Care:  1. Patient evaluated.  2.  Today the patient was molded by our Pedorthist for a [employer]	[place].  This should provide some stability long-term for the chronic Charcot of the foot and potentially prevent  further complications such as wound development 3.  In the meantime, continue weightbearing as tolerated in the cam boot 4.  Return to clinic to have a [employer]	[place] dispensed.  Our office will contact the patient when the [facility] boot is ready Follow-up x-rays when the patient returns to the office bilateral feet     Felecia Shelling, DPM [facility]  Dr. Felecia Shelling, DPM    [address] [place], [place] [address]                Office [phone]  Fax [phone]

## 2021-04-08 ENCOUNTER — Ambulatory Visit (INDEPENDENT_AMBULATORY_CARE_PROVIDER_SITE_OTHER): Payer: Medicare Other | Admitting: Podiatry

## 2021-04-08 ENCOUNTER — Encounter: Payer: Self-pay | Admitting: Podiatry

## 2021-04-08 ENCOUNTER — Other Ambulatory Visit: Payer: Self-pay

## 2021-04-08 DIAGNOSIS — M79675 Pain in left toe(s): Secondary | ICD-10-CM | POA: Diagnosis not present

## 2021-04-08 DIAGNOSIS — M79674 Pain in right toe(s): Secondary | ICD-10-CM | POA: Diagnosis not present

## 2021-04-08 DIAGNOSIS — M2140 Flat foot [pes planus] (acquired), unspecified foot: Secondary | ICD-10-CM

## 2021-04-08 DIAGNOSIS — B351 Tinea unguium: Secondary | ICD-10-CM | POA: Diagnosis not present

## 2021-04-08 DIAGNOSIS — M2141 Flat foot [pes planus] (acquired), right foot: Secondary | ICD-10-CM

## 2021-04-08 NOTE — Progress Notes (Signed)
This patient returns to my office for at risk foot care.  This patient requires this care by a professional since this patient will be at risk due to having  Diabetes.   This patient is unable to cut nails himself since the patient cannot reach his nails.These nails are painful walking and wearing shoes.   . This patient presents for at risk foot care today.  General Appearance  Alert, conversant and in no acute stress.  Vascular  Dorsalis pedis and posterior tibial  pulses are palpable  bilaterally.  Capillary return is within normal limits  bilaterally. Temperature is within normal limits  bilaterally.  Neurologic  Senn-Weinstein monofilament wire test within normal limits  bilaterally. Muscle power within normal limits bilaterally.  Nails Thick disfigured discolored nails with subungual debris  from hallux to fifth toes bilaterally. No evidence of bacterial infection or drainage bilaterally.  Orthopedic  No limitations of motion  feet .  No crepitus or effusions noted.  No bony pathology or digital deformities noted.  PTTD and foot arthritis right foot.  Skin  normotropic skin with no porokeratosis noted bilaterally.  No signs of infections or ulcers noted.   Symptomatic callus TNJ right foot.  Callus midarch medially.  Callus sub 1st MPJ left.  Onychomycosis  Pain in right toes  Pain in left toes  Callus noted plantar  TNJ.  Consent was obtained for treatment procedures.   Mechanical debridement of nails 1-5  bilaterally performed with a nail nipper.  Filed with dremel without incident.  Debride callus with # 15 blade followed by dremel usage.   Return office visit   3 months                   Told patient to return for periodic foot care and evaluation due to potential at risk complications.   Helane Gunther DPM

## 2021-04-09 DIAGNOSIS — Z20828 Contact with and (suspected) exposure to other viral communicable diseases: Secondary | ICD-10-CM | POA: Diagnosis not present

## 2021-04-17 DIAGNOSIS — E119 Type 2 diabetes mellitus without complications: Secondary | ICD-10-CM | POA: Diagnosis not present

## 2021-05-04 ENCOUNTER — Ambulatory Visit (INDEPENDENT_AMBULATORY_CARE_PROVIDER_SITE_OTHER): Payer: Medicare Other | Admitting: Gastroenterology

## 2021-05-17 DIAGNOSIS — E119 Type 2 diabetes mellitus without complications: Secondary | ICD-10-CM | POA: Diagnosis not present

## 2021-05-25 ENCOUNTER — Other Ambulatory Visit: Payer: Self-pay

## 2021-05-25 ENCOUNTER — Ambulatory Visit (INDEPENDENT_AMBULATORY_CARE_PROVIDER_SITE_OTHER): Payer: Medicare Other

## 2021-05-25 ENCOUNTER — Ambulatory Visit (INDEPENDENT_AMBULATORY_CARE_PROVIDER_SITE_OTHER): Payer: Medicare Other | Admitting: Podiatry

## 2021-05-25 DIAGNOSIS — M2141 Flat foot [pes planus] (acquired), right foot: Secondary | ICD-10-CM | POA: Diagnosis not present

## 2021-05-25 DIAGNOSIS — M24271 Disorder of ligament, right ankle: Secondary | ICD-10-CM | POA: Diagnosis not present

## 2021-05-25 DIAGNOSIS — M19079 Primary osteoarthritis, unspecified ankle and foot: Secondary | ICD-10-CM

## 2021-05-25 DIAGNOSIS — M21371 Foot drop, right foot: Secondary | ICD-10-CM

## 2021-05-25 DIAGNOSIS — M76829 Posterior tibial tendinitis, unspecified leg: Secondary | ICD-10-CM

## 2021-05-25 DIAGNOSIS — M24871 Other specific joint derangements of right ankle, not elsewhere classified: Secondary | ICD-10-CM

## 2021-05-25 DIAGNOSIS — M14671 Charcot's joint, right ankle and foot: Secondary | ICD-10-CM

## 2021-05-25 DIAGNOSIS — E1142 Type 2 diabetes mellitus with diabetic polyneuropathy: Secondary | ICD-10-CM

## 2021-05-25 DIAGNOSIS — M24274 Disorder of ligament, right foot: Secondary | ICD-10-CM

## 2021-05-25 DIAGNOSIS — M216X1 Other acquired deformities of right foot: Secondary | ICD-10-CM

## 2021-05-25 NOTE — Progress Notes (Signed)
SITUATION Reason for Visit: Dispensation and Fitting of Custom Molded Brace Patient Report: Patient reports comfort in ambulation and understands all instructions.  OBJECTIVE DATA Patient History / Diagnosis:     ICD-10-CM   1. Charcot's joint of foot, right  M14.671       Provided Device:  Custom Molded Gauntlet: Style [place] EC Neurowalker  Goals of Orthosis: - Improve gait - Decrease energy expenditure during the gait cycle - Improve balance - Stabilize motion at ankle and subtalar joint - Compensate for muscle weakness - Facilitate motion - Provide triplanar ankle and foot stabilization for weight bearing activities  Device Justification: - Patient is ambulatory  - Device is medically necessary as part of the overall treatment due to the patient's condition and related symptoms - It is anticipated that the patient will benefit functionally with use of the device.  - The custom device is utilized in an attempt to avoid the need for surgery and because a prefabricated device is inappropriate.  Upon gait analysis, the device appeared to be fitting well and the patient states that the device is comfortable.  ACTIONS PERFORMED Patient was fit with [employer] AFO. Patient tolerated fitting procedure. Fit of the device is good. Patient was able to apply properly and ambulate without distress. Device function is to restrict and limit motion and provide stabilization in the ankle joint.   Goals and function of this device were explained in detail to the patient. The patient was shown how to properly apply, wear, and care for the device. It was explained that the device will fit and function best in an adjustable-closure shoe with a firm heel counter and a wide base of support. When the device was dispensed, it was suitable for the patient's condition and not substandard. No guarantees were given. Precautions were reviewed.   Written instructions, warranty information, and a  copy of DMEPOS Supplier Standards were provided. All questions answered and concerns addressed.  PLAN Patient is to follow up in one week or as necessary (PRN). Plan of care was discussed with and agreed upon by patient.

## 2021-05-26 ENCOUNTER — Ambulatory Visit (INDEPENDENT_AMBULATORY_CARE_PROVIDER_SITE_OTHER): Payer: Medicare Other | Admitting: Internal Medicine

## 2021-05-26 ENCOUNTER — Encounter (INDEPENDENT_AMBULATORY_CARE_PROVIDER_SITE_OTHER): Payer: Self-pay | Admitting: Internal Medicine

## 2021-05-26 VITALS — BP 159/86 | HR 71 | Temp 99.6°F | Ht 60.0 in | Wt 192.9 lb

## 2021-05-26 DIAGNOSIS — K222 Esophageal obstruction: Secondary | ICD-10-CM | POA: Diagnosis not present

## 2021-05-26 DIAGNOSIS — K219 Gastro-esophageal reflux disease without esophagitis: Secondary | ICD-10-CM

## 2021-05-26 NOTE — Patient Instructions (Signed)
Please keep track of swallowing episodes and remember to chew food thoroughly and eat slowly. Please call with progress report in 3 months or earlier if you have an episode of food impaction lasting for more than few minutes. In case you have an episode of food impaction not relieved spontaneously a family member or friend could do it Heimlich maneuver as I demonstrated to you.

## 2021-05-26 NOTE — Progress Notes (Signed)
Presenting complaint;  Follow-up for GERD and dysphagia.  Database and subjective:  Patient is 71 year old Caucasian female who is here for scheduled visit.   She has a history of GERD.  She underwent emergency endoscopy for esophageal food impaction by Dr. Levon Hedger on 11/07/2020.  She had distal esophageal stricture and small sliding hiatal hernia.  She had inflammation at GE junction secondary to food impaction.  She return for reevaluation on 02/13/2021.  Distal esophageal stricture was dilated to 15 mm with a balloon.  Patient feels she is doing well.  She may have 2 episodes of heartburn in a month.  She has noted heartburn only occurs with certain foods such as sausage bacon and spaghetti.  She is trying to avoid these foods.  She believes she is chewing her food thoroughly.  She had 3 episodes of dysphagia since esophageal dilation.  On one occasion she was eating salad with beef.  She believes these episodes occurred but she did not chew her food thoroughly.  She is trying to eat slowly.  She denies nausea vomiting melena or rectal bleeding.  Her bowels move daily. She is not having any side effects with pantoprazole. Her last colonoscopy was 9 years ago.  Current Medications: Outpatient Encounter Medications as of 05/26/2021  Medication Sig   ASPERCREME LIDOCAINE 4 % CREA Apply 1 application topically 4 (four) times daily as needed (for pain).   CONTOUR NEXT TEST test strip 4 (four) times daily. for testing   furosemide (LASIX) 40 MG tablet Take 40 mg by mouth 2 (two) times daily.   GLOBAL EASE INJECT PEN NEEDLES 31G X 5 MM MISC 4 (four) times daily.   JARDIANCE 25 MG TABS tablet Take 12.5 mg by mouth in the morning.   NOVOLOG FLEXPEN 100 UNIT/ML FlexPen Inject 16 Units into the skin See admin instructions. Inject 16 units into thee skin two times a day before meals, per sliding scale   OVER THE COUNTER MEDICATION Magnessium 500mg  daily  Vit d one daily  Potassium 99mg  one daily    pantoprazole (PROTONIX) 40 MG tablet Take 1 tablet (40 mg total) by mouth daily.   TRESIBA FLEXTOUCH 200 UNIT/ML SOPN Inject 52 Units into the skin at bedtime.   [DISCONTINUED] atorvastatin (LIPITOR) 20 MG tablet Take 20 mg by mouth at bedtime. (Patient not taking: No sig reported)   [DISCONTINUED] Insulin Detemir (LEVEMIR FLEXTOUCH) 100 UNIT/ML Pen Levemir FlexTouch U-100 Insulin 100 unit/mL (3 mL) subcutaneous pen   [DISCONTINUED] polyethylene glycol (MIRALAX / GLYCOLAX) 17 g packet Take 17 g by mouth daily. (Patient not taking: No sig reported)   No facility-administered encounter medications on file as of 05/26/2021.     Objective: Blood pressure (!) 159/86, pulse 71, temperature 99.6 F (37.6 C), temperature source Oral, height 5' (1.524 m), weight 192 lb 14.4 oz (87.5 kg). Patient is alert and in no acute distress. Conjunctiva is pink. Sclera is nonicteric Oropharyngeal mucosa is normal. No neck masses or thyromegaly noted. Cardiac exam with regular rhythm normal S1 and S2. No murmur or gallop noted. Lungs are clear to auscultation. Abdomen is full but soft and nontender with organomegaly or masses. No LE edema or clubbing noted. She is wearing boot for right foot and ankle.   Assessment:  #1.  Esophageal stricture.  She has esophageal stricture secondary to chronic GERD.  She underwent esophagogastroduodenoscopy for removal of esophageal foreign body in July 2022 and esophageal dilation in October 2022.  Distal esophageal stricture was dilated to  15 mm.  She has had 3 episodes of dysphagia since her last dilation.  If she continues to have these spells she would benefit from repeat dilation and hoping to carry to 18 mm.  #2.  Chronic GERD.  Symptoms are well controlled with pantoprazole 40 mg daily.  #3.  Patient is average risk for CRC.  Last colonoscopy was 9 years ago.  She will be due for one next year.   Plan:  Continue pantoprazole at current dose. Patient will try  to chew food thoroughly. If she keeps having episodes of dysphagia she will call office. She was also instructed on how to use Heimlich maneuver in case she has food impaction not resolved spontaneously. Office visit in 6 months.

## 2021-05-26 NOTE — Progress Notes (Signed)
° °  HPI: 71 y.o. female presenting today for follow-up evaluation of longstanding history of Charcot to the right foot. Patient overall is doing well. She has some questions regarding her foot and presents for follow up  Past Medical History:  Diagnosis Date   DM (diabetes mellitus) (HCC)    GERD (gastroesophageal reflux disease)    Gout    Hyperlipidemia    Hypertension      Physical Exam: General: The patient is alert and oriented x3 in no acute distress.  Dermatology: Preulcerative callus lesions noted along the medial longitudinal arch of the right foot secondary to the Charcot foot.  There is no open wound  Vascular: Palpable pedal pulses bilaterally. No edema or erythema noted. Capillary refill within normal limits.  Neurological: Epicritic and protective threshold grossly intact bilaterally.   Musculoskeletal Exam: Pes planus deformity noted with a rigid Charcot foot right.  There is significant pain on palpation throughout the lateral aspect of the foot and lateral rear foot.  Assessment: 1.  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  Pes planus right 3.  Acute pain right foot   Plan of Care:  1. Patient evaluated. All patient questions answered 2. CROW boot was dispensed today by our pedorthist and break-in instructions provided.  3. Recommend recumbent bike for exercise rather than walking.  4. Continue Tramadol 50mg  PRN 5. Return to clinic in 38mos for f/u xray     5mo, DPM [facility]  Dr. Felecia Shelling, DPM    [address] [place], [place]                Office [phone]  Fax [phone]

## 2021-06-09 DIAGNOSIS — Z7189 Other specified counseling: Secondary | ICD-10-CM | POA: Diagnosis not present

## 2021-06-09 DIAGNOSIS — M2550 Pain in unspecified joint: Secondary | ICD-10-CM | POA: Diagnosis not present

## 2021-06-09 DIAGNOSIS — Z1331 Encounter for screening for depression: Secondary | ICD-10-CM | POA: Diagnosis not present

## 2021-06-09 DIAGNOSIS — I10 Essential (primary) hypertension: Secondary | ICD-10-CM | POA: Diagnosis not present

## 2021-06-09 DIAGNOSIS — Z299 Encounter for prophylactic measures, unspecified: Secondary | ICD-10-CM | POA: Diagnosis not present

## 2021-06-09 DIAGNOSIS — Z6836 Body mass index (BMI) 36.0-36.9, adult: Secondary | ICD-10-CM | POA: Diagnosis not present

## 2021-06-09 DIAGNOSIS — Z0000 Encounter for general adult medical examination without abnormal findings: Secondary | ICD-10-CM | POA: Diagnosis not present

## 2021-06-09 DIAGNOSIS — Z79899 Other long term (current) drug therapy: Secondary | ICD-10-CM | POA: Diagnosis not present

## 2021-06-09 DIAGNOSIS — Z1339 Encounter for screening examination for other mental health and behavioral disorders: Secondary | ICD-10-CM | POA: Diagnosis not present

## 2021-06-09 DIAGNOSIS — R5383 Other fatigue: Secondary | ICD-10-CM | POA: Diagnosis not present

## 2021-06-09 DIAGNOSIS — E7800 Pure hypercholesterolemia, unspecified: Secondary | ICD-10-CM | POA: Diagnosis not present

## 2021-06-16 DIAGNOSIS — E119 Type 2 diabetes mellitus without complications: Secondary | ICD-10-CM | POA: Diagnosis not present

## 2021-06-29 DIAGNOSIS — Z20828 Contact with and (suspected) exposure to other viral communicable diseases: Secondary | ICD-10-CM | POA: Diagnosis not present

## 2021-07-14 ENCOUNTER — Other Ambulatory Visit: Payer: Self-pay

## 2021-07-14 ENCOUNTER — Encounter: Payer: Self-pay | Admitting: Podiatry

## 2021-07-14 ENCOUNTER — Ambulatory Visit (INDEPENDENT_AMBULATORY_CARE_PROVIDER_SITE_OTHER): Payer: Medicare Other | Admitting: Podiatry

## 2021-07-14 DIAGNOSIS — L84 Corns and callosities: Secondary | ICD-10-CM

## 2021-07-14 DIAGNOSIS — M14671 Charcot's joint, right ankle and foot: Secondary | ICD-10-CM

## 2021-07-14 DIAGNOSIS — B351 Tinea unguium: Secondary | ICD-10-CM

## 2021-07-14 DIAGNOSIS — E1142 Type 2 diabetes mellitus with diabetic polyneuropathy: Secondary | ICD-10-CM

## 2021-07-14 DIAGNOSIS — M79675 Pain in left toe(s): Secondary | ICD-10-CM | POA: Diagnosis not present

## 2021-07-14 DIAGNOSIS — M79674 Pain in right toe(s): Secondary | ICD-10-CM | POA: Diagnosis not present

## 2021-07-14 DIAGNOSIS — M21371 Foot drop, right foot: Secondary | ICD-10-CM

## 2021-07-14 NOTE — Progress Notes (Signed)
This patient returns to my office for at risk foot care.  This patient requires this care by a professional since this patient will be at risk due to having  Diabetes.   This patient is unable to cut nails himself since the patient cannot reach his nails.These nails are painful walking and wearing shoes.   . This patient presents for at risk foot care today. ° °General Appearance  Alert, conversant and in no acute stress. ° °Vascular  Dorsalis pedis and posterior tibial  pulses are palpable  bilaterally.  Capillary return is within normal limits  bilaterally. Temperature is within normal limits  bilaterally. ° °Neurologic  Senn-Weinstein monofilament wire test within normal limits  bilaterally. Muscle power within normal limits bilaterally. ° °Nails Thick disfigured discolored nails with subungual debris  from hallux to fifth toes bilaterally. No evidence of bacterial infection or drainage bilaterally. ° °Orthopedic  No limitations of motion  feet .  No crepitus or effusions noted.  No bony pathology or digital deformities noted.  PTTD and foot arthritis right foot. ° °Skin  normotropic skin with no porokeratosis noted bilaterally.  No signs of infections or ulcers noted.   Symptomatic callus TNJ right foot.  Callus midarch medially.  Callus sub 1st MPJ left. ° °Onychomycosis  Pain in right toes  Pain in left toes  Callus noted plantar  TNJ. ° °Consent was obtained for treatment procedures.   Mechanical debridement of nails 1-5  bilaterally performed with a nail nipper.  Filed with dremel without incident.  Debride callus with # 15 blade followed by dremel usage. ° ° °Return office visit   3 months                   Told patient to return for periodic foot care and evaluation due to potential at risk complications. ° ° °Toryn Mcclinton DPM  °

## 2021-07-16 DIAGNOSIS — E119 Type 2 diabetes mellitus without complications: Secondary | ICD-10-CM | POA: Diagnosis not present

## 2021-07-21 DIAGNOSIS — Z789 Other specified health status: Secondary | ICD-10-CM | POA: Diagnosis not present

## 2021-07-21 DIAGNOSIS — I10 Essential (primary) hypertension: Secondary | ICD-10-CM | POA: Diagnosis not present

## 2021-07-21 DIAGNOSIS — Z299 Encounter for prophylactic measures, unspecified: Secondary | ICD-10-CM | POA: Diagnosis not present

## 2021-07-21 DIAGNOSIS — E1165 Type 2 diabetes mellitus with hyperglycemia: Secondary | ICD-10-CM | POA: Diagnosis not present

## 2021-08-03 ENCOUNTER — Encounter: Payer: Self-pay | Admitting: Podiatry

## 2021-08-03 ENCOUNTER — Ambulatory Visit: Payer: Medicare Other

## 2021-08-03 DIAGNOSIS — E1142 Type 2 diabetes mellitus with diabetic polyneuropathy: Secondary | ICD-10-CM

## 2021-08-03 DIAGNOSIS — M14671 Charcot's joint, right ankle and foot: Secondary | ICD-10-CM

## 2021-08-03 DIAGNOSIS — M2141 Flat foot [pes planus] (acquired), right foot: Secondary | ICD-10-CM

## 2021-08-03 DIAGNOSIS — M216X1 Other acquired deformities of right foot: Secondary | ICD-10-CM

## 2021-08-03 NOTE — Progress Notes (Signed)
SITUATION ?Reason for Consult: Evaluation for Prefabricated Diabetic Shoes and Custom Diabetic Inserts. ?Patient / Caregiver Report: Patient would like well fitting shoes ? ?OBJECTIVE DATA: ?Patient History / Diagnosis:  ?  ICD-10-CM   ?1. Diabetic polyneuropathy associated with type 2 diabetes mellitus ([place])  E11.42   ?  ?2. Charcot's joint of foot, right  M14.671   ?  ?3. Flat foot (pes planus) (acquired), right foot  M21.41   ?  ?4. Other acquired deformities of right foot  M21.6X1   ?  ? ? ?Physician Treating Diabetes:  Glenda Chroman, MD ? ?Current or Previous Devices:   Right EC Neurowalker, existing diabetic shoe user ? ?In-Person Foot Examination: ?Ulcers & Callousing:   Historical ?Deformities:    Charcot, right, pes planus right ?Sensation:    Compromised  ?Shoe Size:     8XW ? ?ORTHOTIC RECOMMENDATION ?Recommended Devices: ?- 1x pair prefabricated PDAC approved diabetic shoes; Patient Selected Apex X821W Size 8XW ?- 3x pair custom-to-patient PDAC approved vacuum formed diabetic insoles. ? ?GOALS OF SHOES AND INSOLES ?- Reduce shear and pressure ?- Reduce / Prevent callus formation ?- Reduce / Prevent ulceration ?- Protect the fragile healing compromised diabetic foot. ? ?Patient would benefit from diabetic shoes and inserts as patient has diabetes mellitus and the patient has one or more of the following conditions: ?- History of partial or complete amputation of the foot ?- History of previous foot ulceration. ?- History of pre-ulcerative callus ?- Peripheral neuropathy with evidence of callus formation ?- Foot deformity ?- Poor circulation ? ?ACTIONS PERFORMED ?Potential out of pocket cost was communicated to patient. Patient understood and consented to measurement and casting. Patient was casted for insoles via crush box and measured for shoes via brannock device. Procedure was explained and patient tolerated procedure well. All questions were answered and concerns addressed. Casts were shipped to  central fabrication for HOLD until Certificate of Medical Necessity or otherwise necessary authorization from insurance is obtained. ? ?PLAN ?Shoes are to be ordered and casts released from hold once all appropriate paperwork is complete. Patient is to be contacted and scheduled for fitting once shoes and insoles have been fabricated and received. ? ?

## 2021-08-04 DIAGNOSIS — E119 Type 2 diabetes mellitus without complications: Secondary | ICD-10-CM | POA: Diagnosis not present

## 2021-08-05 ENCOUNTER — Other Ambulatory Visit: Payer: Self-pay | Admitting: Podiatry

## 2021-08-05 MED ORDER — TRAMADOL HCL 50 MG PO TABS: 50.0000 mg | ORAL_TABLET | ORAL | 0 refills | Status: AC | PRN

## 2021-08-16 DIAGNOSIS — E119 Type 2 diabetes mellitus without complications: Secondary | ICD-10-CM | POA: Diagnosis not present

## 2021-09-04 ENCOUNTER — Telehealth: Payer: Self-pay

## 2021-09-04 NOTE — Telephone Encounter (Signed)
CMN submitted ?

## 2021-09-11 ENCOUNTER — Telehealth: Payer: Self-pay

## 2021-09-11 NOTE — Telephone Encounter (Signed)
Attempted to return phonecall to let patient know we have not received the CMN, mailbox is full

## 2021-09-15 DIAGNOSIS — E119 Type 2 diabetes mellitus without complications: Secondary | ICD-10-CM | POA: Diagnosis not present

## 2021-09-25 ENCOUNTER — Telehealth: Payer: Self-pay

## 2021-09-25 NOTE — Telephone Encounter (Signed)
CMN Received - Shoes ordered and casts released from fabrication hold.  

## 2021-10-14 ENCOUNTER — Encounter: Payer: Self-pay | Admitting: Podiatry

## 2021-10-14 ENCOUNTER — Ambulatory Visit (INDEPENDENT_AMBULATORY_CARE_PROVIDER_SITE_OTHER): Payer: Medicare Other | Admitting: Podiatry

## 2021-10-14 DIAGNOSIS — L84 Corns and callosities: Secondary | ICD-10-CM | POA: Diagnosis not present

## 2021-10-14 DIAGNOSIS — M2141 Flat foot [pes planus] (acquired), right foot: Secondary | ICD-10-CM

## 2021-10-14 DIAGNOSIS — B351 Tinea unguium: Secondary | ICD-10-CM | POA: Diagnosis not present

## 2021-10-14 DIAGNOSIS — M79674 Pain in right toe(s): Secondary | ICD-10-CM | POA: Diagnosis not present

## 2021-10-14 DIAGNOSIS — E1142 Type 2 diabetes mellitus with diabetic polyneuropathy: Secondary | ICD-10-CM | POA: Diagnosis not present

## 2021-10-14 DIAGNOSIS — M14671 Charcot's joint, right ankle and foot: Secondary | ICD-10-CM | POA: Diagnosis not present

## 2021-10-14 DIAGNOSIS — M79675 Pain in left toe(s): Secondary | ICD-10-CM

## 2021-10-14 NOTE — Progress Notes (Signed)
This patient returns to my office for at risk foot care.  This patient requires this care by a professional since this patient will be at risk due to having  Diabetes.   This patient is unable to cut nails himself since the patient cannot reach his nails.These nails are painful walking and wearing shoes.   . This patient presents for at risk foot care today.  General Appearance  Alert, conversant and in no acute stress.  Vascular  Dorsalis pedis and posterior tibial  pulses are palpable  bilaterally.  Capillary return is within normal limits  bilaterally. Temperature is within normal limits  bilaterally.  Neurologic  Senn-Weinstein monofilament wire test within normal limits  bilaterally. Muscle power within normal limits bilaterally.  Nails Thick disfigured discolored nails with subungual debris  from hallux to fifth toes bilaterally. No evidence of bacterial infection or drainage bilaterally.  Orthopedic  No limitations of motion  feet .  No crepitus or effusions noted.  No bony pathology or digital deformities noted.  PTTD and foot arthritis right foot.  Skin  normotropic skin with no porokeratosis noted bilaterally.  No signs of infections or ulcers noted.   Symptomatic callus TNJ right foot.  Callus midarch medially.  Callus sub 1st MPJ left.  Onychomycosis  Pain in right toes  Pain in left toes  Callus noted plantar  TNJ.  Consent was obtained for treatment procedures.   Mechanical debridement of nails 1-5  bilaterally performed with a nail nipper.  Filed with dremel without incident.  Debride callus with # 15 blade followed by dremel usage. Talked with [place] about her shoes.   Return office visit   3 months                   Told patient to return for periodic foot care and evaluation due to potential at risk complications.   Helane Gunther DPM

## 2021-10-15 DIAGNOSIS — E119 Type 2 diabetes mellitus without complications: Secondary | ICD-10-CM | POA: Diagnosis not present

## 2021-10-29 ENCOUNTER — Other Ambulatory Visit: Payer: Self-pay | Admitting: Podiatry

## 2021-10-29 ENCOUNTER — Encounter: Payer: Self-pay | Admitting: Podiatry

## 2021-10-29 ENCOUNTER — Telehealth: Payer: Self-pay

## 2021-10-29 MED ORDER — TRAMADOL HCL 50 MG PO TABS: 50.0000 mg | ORAL_TABLET | ORAL | 0 refills | Status: AC | PRN

## 2021-10-29 NOTE — Telephone Encounter (Signed)
Refill sent to the pharmacy. Please notify patient. - Dr. Logan Bores

## 2021-10-29 NOTE — Progress Notes (Signed)
PRN ankle pain 

## 2021-11-02 NOTE — Telephone Encounter (Signed)
No additional notes need by the CMA.

## 2021-11-11 DIAGNOSIS — Z713 Dietary counseling and surveillance: Secondary | ICD-10-CM | POA: Diagnosis not present

## 2021-11-11 DIAGNOSIS — Z6837 Body mass index (BMI) 37.0-37.9, adult: Secondary | ICD-10-CM | POA: Diagnosis not present

## 2021-11-11 DIAGNOSIS — I10 Essential (primary) hypertension: Secondary | ICD-10-CM | POA: Diagnosis not present

## 2021-11-11 DIAGNOSIS — Z299 Encounter for prophylactic measures, unspecified: Secondary | ICD-10-CM | POA: Diagnosis not present

## 2021-11-11 DIAGNOSIS — E1165 Type 2 diabetes mellitus with hyperglycemia: Secondary | ICD-10-CM | POA: Diagnosis not present

## 2021-11-16 DIAGNOSIS — E119 Type 2 diabetes mellitus without complications: Secondary | ICD-10-CM | POA: Diagnosis not present

## 2021-11-23 ENCOUNTER — Ambulatory Visit (INDEPENDENT_AMBULATORY_CARE_PROVIDER_SITE_OTHER): Payer: Medicare Other

## 2021-11-23 ENCOUNTER — Ambulatory Visit (INDEPENDENT_AMBULATORY_CARE_PROVIDER_SITE_OTHER): Payer: Medicare Other | Admitting: Podiatry

## 2021-11-23 DIAGNOSIS — M14671 Charcot's joint, right ankle and foot: Secondary | ICD-10-CM

## 2021-11-23 NOTE — Progress Notes (Signed)
Chief Complaint  Patient presents with   Follow-up    Charcot's joint of foot, right. p/u diabetic shoes    HPI: 71 y.o. female presenting today for follow-up evaluation of longstanding history of Charcot to the right foot. Patient overall is doing well. She has some questions regarding her foot and presents for follow up  Past Medical History:  Diagnosis Date   DM (diabetes mellitus) (HCC)    GERD (gastroesophageal reflux disease)    Gout    Hyperlipidemia    Hypertension    Past Surgical History:  Procedure Laterality Date   BIOPSY  02/13/2021   Procedure: BIOPSY;  Surgeon: Dolores Frame, MD;  [facility];  Service: Gastroenterology;;   ESOPHAGEAL DILATION N/A 02/13/2021   Procedure: ESOPHAGEAL DILATION;  Surgeon: Dolores Frame, MD;  [facility];  Service: Gastroenterology;  Laterality: N/A;   ESOPHAGOGASTRODUODENOSCOPY N/A 11/07/2020   Cirigliano: r/t food impaction in lower third of esophagus, food bolus advanced into stomach with air insufflation and gentle endoscopic pressure, small hh, bening appearing esphageal stenosis, normal stomach and duodenum, no specimens   ESOPHAGOGASTRODUODENOSCOPY (EGD) WITH PROPOFOL N/A 02/13/2021   Procedure: ESOPHAGOGASTRODUODENOSCOPY (EGD) WITH PROPOFOL;  Surgeon: Dolores Frame, MD;  [facility];  Service: Gastroenterology;  Laterality: N/A;   FOREIGN BODY REMOVAL  11/07/2020   Procedure: FOREIGN BODY REMOVAL;  Surgeon: Shellia Cleverly, DO;  [facility];  Service: Gastroenterology;;   REVERSE SHOULDER ARTHROPLASTY Right 09/17/2020   Procedure: REVERSE SHOULDER ARTHROPLASTY;  Surgeon: Bjorn Pippin, MD;  Location: [facility];  Service: Orthopedics;  Laterality: Right;   Allergies  Allergen Reactions   Lisinopril Diarrhea   Metformin Diarrhea    Physical Exam: General: The patient is alert and oriented x3 in no acute  distress.  Dermatology: Preulcerative callus lesions noted along the medial longitudinal arch of the right foot secondary to the Charcot foot.  There is no open wound  Vascular: Palpable pedal pulses bilaterally. No edema or erythema noted. Capillary refill within normal limits.  Neurological: Epicritic and protective threshold grossly intact bilaterally.   Musculoskeletal Exam: Pes planus deformity noted with a rigid Charcot foot right.  There is significant pain on palpation throughout the lateral aspect of the foot and lateral rear foot.  Radiographic exam B/L feet: Stable Charcot neuroarthropathy of the right foot.  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  Moderate degenerative changes noted throughout the midtarsal joint bilateral.  There is some periarticular spurring and joint space narrowing to the first metatarsal cuneiform joint of the left foot.  Assessment: 1.  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  Pes planus right    Plan of Care:  1. Patient evaluated.  X-rays reviewed today  2.  Continue wearing the [facility] boot right foot daily 3.  Diabetic shoes with Plastizote inserts were dispensed today 4.  Overall the patient is doing very well in the [employer]	[place].  Surgery was offered to the patient as an option but she states that she is not in a position for surgery because she has no one to care for her.  I would refer the patient out for Charcot recon surgery but the patient declined.  She would like to continue conservative management  5.  Return to clinic 6 months for routine follow-up     Felecia Shelling, DPM [facility]  Dr. Kipp Brood  Arlyce Dice, DPM    [address] [place], [place] [address]                Office [phone]  Fax [phone]

## 2021-11-30 ENCOUNTER — Ambulatory Visit (INDEPENDENT_AMBULATORY_CARE_PROVIDER_SITE_OTHER): Payer: Medicare Other | Admitting: Gastroenterology

## 2021-12-16 DIAGNOSIS — E119 Type 2 diabetes mellitus without complications: Secondary | ICD-10-CM | POA: Diagnosis not present

## 2021-12-30 ENCOUNTER — Encounter: Payer: Self-pay | Admitting: Podiatry

## 2021-12-30 ENCOUNTER — Other Ambulatory Visit: Payer: Self-pay | Admitting: Podiatry

## 2021-12-30 MED ORDER — TRAMADOL HCL 50 MG PO TABS: 50.0000 mg | ORAL_TABLET | ORAL | 0 refills | Status: AC | PRN

## 2022-01-15 DIAGNOSIS — E119 Type 2 diabetes mellitus without complications: Secondary | ICD-10-CM | POA: Diagnosis not present

## 2022-01-18 ENCOUNTER — Ambulatory Visit (INDEPENDENT_AMBULATORY_CARE_PROVIDER_SITE_OTHER): Payer: Medicare Other | Admitting: Podiatry

## 2022-01-18 ENCOUNTER — Encounter: Payer: Self-pay | Admitting: Podiatry

## 2022-01-18 DIAGNOSIS — M14671 Charcot's joint, right ankle and foot: Secondary | ICD-10-CM

## 2022-01-18 DIAGNOSIS — E1142 Type 2 diabetes mellitus with diabetic polyneuropathy: Secondary | ICD-10-CM

## 2022-01-18 DIAGNOSIS — M79674 Pain in right toe(s): Secondary | ICD-10-CM | POA: Diagnosis not present

## 2022-01-18 DIAGNOSIS — B351 Tinea unguium: Secondary | ICD-10-CM | POA: Diagnosis not present

## 2022-01-18 DIAGNOSIS — M79675 Pain in left toe(s): Secondary | ICD-10-CM

## 2022-01-18 NOTE — Progress Notes (Signed)
This patient returns to my office for at risk foot care.  This patient requires this care by a professional since this patient will be at risk due to having  Diabetes.   This patient is unable to cut nails himself since the patient cannot reach his nails.These nails are painful walking and wearing shoes.   . This patient presents for at risk foot care today.  Patient is now weaing cam walker right foot.  General Appearance  Alert, conversant and in no acute stress.  Vascular  Dorsalis pedis and posterior tibial  pulses are palpable  bilaterally.  Capillary return is within normal limits  bilaterally. Temperature is within normal limits  bilaterally.  Neurologic  Senn-Weinstein monofilament wire test within normal limits  bilaterally. Muscle power within normal limits bilaterally.  Nails Thick disfigured discolored nails with subungual debris  from hallux to fifth toes bilaterally. No evidence of bacterial infection or drainage bilaterally.  Orthopedic  No limitations of motion  feet .  No crepitus or effusions noted.  No bony pathology or digital deformities noted.  PTTD and foot arthritis right foot.  Skin  normotropic skin with no porokeratosis noted bilaterally.  No signs of infections or ulcers noted.     Callus sub 1st MPJ left asymptomatic.  Onychomycosis  Pain in right toes  Pain in left toes    Consent was obtained for treatment procedures.   Mechanical debridement of nails 1-5  bilaterally performed with a nail nipper.  Filed with dremel without incident.     Return office visit   3 months                   Told patient to return for periodic foot care and evaluation due to potential at risk complications.   Alyria Krack DPM  

## 2022-02-15 DIAGNOSIS — E119 Type 2 diabetes mellitus without complications: Secondary | ICD-10-CM | POA: Diagnosis not present

## 2022-02-19 DIAGNOSIS — Z23 Encounter for immunization: Secondary | ICD-10-CM | POA: Diagnosis not present

## 2022-03-12 DIAGNOSIS — Z299 Encounter for prophylactic measures, unspecified: Secondary | ICD-10-CM | POA: Diagnosis not present

## 2022-03-12 DIAGNOSIS — E1165 Type 2 diabetes mellitus with hyperglycemia: Secondary | ICD-10-CM | POA: Diagnosis not present

## 2022-03-12 DIAGNOSIS — R053 Chronic cough: Secondary | ICD-10-CM | POA: Diagnosis not present

## 2022-03-12 DIAGNOSIS — I10 Essential (primary) hypertension: Secondary | ICD-10-CM | POA: Diagnosis not present

## 2022-03-17 DIAGNOSIS — E119 Type 2 diabetes mellitus without complications: Secondary | ICD-10-CM | POA: Diagnosis not present

## 2022-03-26 ENCOUNTER — Encounter: Payer: Self-pay | Admitting: Podiatry

## 2022-03-31 ENCOUNTER — Encounter: Payer: Self-pay | Admitting: Podiatry

## 2022-04-01 ENCOUNTER — Other Ambulatory Visit: Payer: Self-pay | Admitting: Podiatry

## 2022-04-01 MED ORDER — TRAMADOL HCL 50 MG PO TABS: 50.0000 mg | ORAL_TABLET | Freq: Four times a day (QID) | ORAL | 0 refills | Status: DC | PRN

## 2022-04-16 DIAGNOSIS — Z23 Encounter for immunization: Secondary | ICD-10-CM | POA: Diagnosis not present

## 2022-04-21 ENCOUNTER — Ambulatory Visit (INDEPENDENT_AMBULATORY_CARE_PROVIDER_SITE_OTHER): Payer: Medicare Other | Admitting: Podiatry

## 2022-04-21 ENCOUNTER — Encounter: Payer: Self-pay | Admitting: Podiatry

## 2022-04-21 VITALS — BP 137/49

## 2022-04-21 DIAGNOSIS — M79675 Pain in left toe(s): Secondary | ICD-10-CM | POA: Diagnosis not present

## 2022-04-21 DIAGNOSIS — M79674 Pain in right toe(s): Secondary | ICD-10-CM

## 2022-04-21 DIAGNOSIS — E1142 Type 2 diabetes mellitus with diabetic polyneuropathy: Secondary | ICD-10-CM

## 2022-04-21 DIAGNOSIS — M2141 Flat foot [pes planus] (acquired), right foot: Secondary | ICD-10-CM | POA: Diagnosis not present

## 2022-04-21 DIAGNOSIS — B351 Tinea unguium: Secondary | ICD-10-CM

## 2022-04-21 NOTE — Progress Notes (Signed)
This patient returns to my office for at risk foot care.  This patient requires this care by a professional since this patient will be at risk due to having  Diabetes.   This patient is unable to cut nails himself since the patient cannot reach his nails.These nails are painful walking and wearing shoes.   . This patient presents for at risk foot care today.  Patient is now weaing cam walker right foot.  General Appearance  Alert, conversant and in no acute stress.  Vascular  Dorsalis pedis and posterior tibial  pulses are palpable  bilaterally.  Capillary return is within normal limits  bilaterally. Temperature is within normal limits  bilaterally.  Neurologic  Senn-Weinstein monofilament wire test within normal limits  bilaterally. Muscle power within normal limits bilaterally.  Nails Thick disfigured discolored nails with subungual debris  from hallux to fifth toes bilaterally. No evidence of bacterial infection or drainage bilaterally.  Orthopedic  No limitations of motion  feet .  No crepitus or effusions noted.  No bony pathology or digital deformities noted.  PTTD and foot arthritis right foot.  Skin  normotropic skin with no porokeratosis noted bilaterally.  No signs of infections or ulcers noted.     Callus sub 1st MPJ left asymptomatic.  Onychomycosis  Pain in right toes  Pain in left toes    Consent was obtained for treatment procedures.   Mechanical debridement of nails 1-5  bilaterally performed with a nail nipper.  Filed with dremel without incident.     Return office visit   3 months                   Told patient to return for periodic foot care and evaluation due to potential at risk complications.   Gardiner Barefoot DPM

## 2022-05-24 ENCOUNTER — Ambulatory Visit (INDEPENDENT_AMBULATORY_CARE_PROVIDER_SITE_OTHER): Payer: Medicare Other

## 2022-05-24 ENCOUNTER — Ambulatory Visit (INDEPENDENT_AMBULATORY_CARE_PROVIDER_SITE_OTHER): Payer: Medicare Other | Admitting: Podiatry

## 2022-05-24 DIAGNOSIS — M14671 Charcot's joint, right ankle and foot: Secondary | ICD-10-CM

## 2022-05-24 NOTE — Progress Notes (Signed)
Chief Complaint  Patient presents with   Foot Problem    Right foot Charcot's joint, patient denies any pain, X-rays taken today     HPI: 72 y.o. female presenting today for follow-up evaluation of longstanding history of Charcot to the right foot. Patient overall is doing well.  Patient has been wearing her [employer] as instructed.  She was last seen here in the office 11/23/2021. Past Medical History:  Diagnosis Date   DM (diabetes mellitus) ([place])    GERD (gastroesophageal reflux disease)    Gout    Hyperlipidemia    Hypertension    Past Surgical History:  Procedure Laterality Date   BIOPSY  02/13/2021   Procedure: BIOPSY;  Surgeon: Harvel Quale, MD;  [facility];  Service: Gastroenterology;;   ESOPHAGEAL DILATION N/A 02/13/2021   Procedure: ESOPHAGEAL DILATION;  Surgeon: Harvel Quale, MD;  [facility];  Service: Gastroenterology;  Laterality: N/A;   ESOPHAGOGASTRODUODENOSCOPY N/A 11/07/2020   Cirigliano: r/t food impaction in lower third of esophagus, food bolus advanced into stomach with air insufflation and gentle endoscopic pressure, small hh, bening appearing esphageal stenosis, normal stomach and duodenum, no specimens   ESOPHAGOGASTRODUODENOSCOPY (EGD) WITH PROPOFOL N/A 02/13/2021   Procedure: ESOPHAGOGASTRODUODENOSCOPY (EGD) WITH PROPOFOL;  Surgeon: Harvel Quale, MD;  [facility];  Service: Gastroenterology;  Laterality: N/A;   FOREIGN BODY REMOVAL  11/07/2020   Procedure: FOREIGN BODY REMOVAL;  Surgeon: Lavena Bullion, DO;  Location: [facility];  Service: Gastroenterology;;   REVERSE SHOULDER ARTHROPLASTY Right 09/17/2020   Procedure: REVERSE SHOULDER ARTHROPLASTY;  Surgeon: Hiram Gash, MD;  Location: [facility];  Service: Orthopedics;  Laterality: Right;   Allergies  Allergen Reactions   Lisinopril Diarrhea   Metformin Diarrhea    Physical Exam: General: The patient is  alert and oriented x3 in no acute distress.  Dermatology: Preulcerative callus lesions noted along the medial longitudinal arch of the right foot secondary to the Charcot foot.  There is no open wound  Vascular: Palpable pedal pulses bilaterally. No edema or erythema noted. Capillary refill within normal limits.  Neurological: Epicritic and protective threshold grossly intact bilaterally.   Musculoskeletal Exam: Pes planus deformity noted with a rigid Charcot foot right.  There is significant pain on palpation throughout the lateral aspect of the foot and lateral rear foot.  Radiographic exam RT foot 05/24/2022: Stable and unchanged compared to prior x-rays taken 11/23/2021.  Stable Charcot neuroarthropathy of the right foot.  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  Moderate degenerative changes noted throughout the midtarsal joint bilateral.   Assessment: 1.  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  Pes planus right    Plan of Care:  1. Patient evaluated.  X-rays reviewed today and compared to prior x-rays 2.  Continue wearing the [facility] boot right foot daily.  Additional prescription provided for the patient to take to [facility] lab for new [place] boot and insole molding 3.  Continue diabetic shoes with Plastizote insoles left foot 4.  Return to clinic annually   Edrick Kins, DPM [facility]  Dr. Edrick Kins, DPM    [address]  [place], [place] [address]                Office [phone]  Fax [phone]

## 2022-05-27 ENCOUNTER — Other Ambulatory Visit: Payer: Self-pay | Admitting: Podiatry

## 2022-05-27 DIAGNOSIS — M14671 Charcot's joint, right ankle and foot: Secondary | ICD-10-CM

## 2022-06-01 DIAGNOSIS — Z1331 Encounter for screening for depression: Secondary | ICD-10-CM | POA: Diagnosis not present

## 2022-06-01 DIAGNOSIS — R5383 Other fatigue: Secondary | ICD-10-CM | POA: Diagnosis not present

## 2022-06-01 DIAGNOSIS — Z79899 Other long term (current) drug therapy: Secondary | ICD-10-CM | POA: Diagnosis not present

## 2022-06-01 DIAGNOSIS — Z299 Encounter for prophylactic measures, unspecified: Secondary | ICD-10-CM | POA: Diagnosis not present

## 2022-06-01 DIAGNOSIS — I10 Essential (primary) hypertension: Secondary | ICD-10-CM | POA: Diagnosis not present

## 2022-06-01 DIAGNOSIS — Z7189 Other specified counseling: Secondary | ICD-10-CM | POA: Diagnosis not present

## 2022-06-01 DIAGNOSIS — E7800 Pure hypercholesterolemia, unspecified: Secondary | ICD-10-CM | POA: Diagnosis not present

## 2022-06-01 DIAGNOSIS — Z0000 Encounter for general adult medical examination without abnormal findings: Secondary | ICD-10-CM | POA: Diagnosis not present

## 2022-06-01 DIAGNOSIS — E1165 Type 2 diabetes mellitus with hyperglycemia: Secondary | ICD-10-CM | POA: Diagnosis not present

## 2022-06-01 DIAGNOSIS — Z1339 Encounter for screening examination for other mental health and behavioral disorders: Secondary | ICD-10-CM | POA: Diagnosis not present

## 2022-06-01 DIAGNOSIS — F321 Major depressive disorder, single episode, moderate: Secondary | ICD-10-CM | POA: Diagnosis not present

## 2022-06-01 DIAGNOSIS — Z6837 Body mass index (BMI) 37.0-37.9, adult: Secondary | ICD-10-CM | POA: Diagnosis not present

## 2022-06-11 DIAGNOSIS — D72829 Elevated white blood cell count, unspecified: Secondary | ICD-10-CM | POA: Diagnosis not present

## 2022-07-01 DIAGNOSIS — Z6837 Body mass index (BMI) 37.0-37.9, adult: Secondary | ICD-10-CM | POA: Diagnosis not present

## 2022-07-01 DIAGNOSIS — E1165 Type 2 diabetes mellitus with hyperglycemia: Secondary | ICD-10-CM | POA: Diagnosis not present

## 2022-07-01 DIAGNOSIS — I10 Essential (primary) hypertension: Secondary | ICD-10-CM | POA: Diagnosis not present

## 2022-07-01 DIAGNOSIS — Z299 Encounter for prophylactic measures, unspecified: Secondary | ICD-10-CM | POA: Diagnosis not present

## 2022-07-14 ENCOUNTER — Encounter: Payer: Self-pay | Admitting: Podiatry

## 2022-07-14 ENCOUNTER — Other Ambulatory Visit: Payer: Self-pay | Admitting: Podiatry

## 2022-07-14 MED ORDER — TRAMADOL HCL 50 MG PO TABS: 50.0000 mg | ORAL_TABLET | Freq: Four times a day (QID) | ORAL | 0 refills | Status: AC | PRN

## 2022-07-27 ENCOUNTER — Ambulatory Visit (INDEPENDENT_AMBULATORY_CARE_PROVIDER_SITE_OTHER): Payer: Medicare Other | Admitting: Podiatry

## 2022-07-27 ENCOUNTER — Encounter: Payer: Self-pay | Admitting: Podiatry

## 2022-07-27 DIAGNOSIS — M79674 Pain in right toe(s): Secondary | ICD-10-CM

## 2022-07-27 DIAGNOSIS — M2141 Flat foot [pes planus] (acquired), right foot: Secondary | ICD-10-CM

## 2022-07-27 DIAGNOSIS — M79675 Pain in left toe(s): Secondary | ICD-10-CM

## 2022-07-27 DIAGNOSIS — E1142 Type 2 diabetes mellitus with diabetic polyneuropathy: Secondary | ICD-10-CM | POA: Diagnosis not present

## 2022-07-27 DIAGNOSIS — B351 Tinea unguium: Secondary | ICD-10-CM | POA: Diagnosis not present

## 2022-07-27 NOTE — Progress Notes (Signed)
This patient returns to my office for at risk foot care.  This patient requires this care by a professional since this patient will be at risk due to having  Diabetes.   This patient is unable to cut nails himself since the patient cannot reach his nails.These nails are painful walking and wearing shoes.   . This patient presents for at risk foot care today.  Patient is now weaing cam walker right foot.  General Appearance  Alert, conversant and in no acute stress.  Vascular  Dorsalis pedis and posterior tibial  pulses are palpable  bilaterally.  Capillary return is within normal limits  bilaterally. Temperature is within normal limits  bilaterally.  Neurologic  Senn-Weinstein monofilament wire test within normal limits  bilaterally. Muscle power within normal limits bilaterally.  Nails Thick disfigured discolored nails with subungual debris  from hallux to fifth toes bilaterally. No evidence of bacterial infection or drainage bilaterally.  Orthopedic  No limitations of motion  feet .  No crepitus or effusions noted.  No bony pathology or digital deformities noted.  PTTD and foot arthritis right foot.  Skin  normotropic skin with no porokeratosis noted bilaterally.  No signs of infections or ulcers noted.     Callus sub 1st MPJ left asymptomatic.  Onychomycosis  Pain in right toes  Pain in left toes    Consent was obtained for treatment procedures.   Mechanical debridement of nails 1-5  bilaterally performed with a nail nipper.  Filed with dremel without incident.     Return office visit   3 months                   Told patient to return for periodic foot care and evaluation due to potential at risk complications.   Shawna Wearing DPM  

## 2022-08-09 DIAGNOSIS — E119 Type 2 diabetes mellitus without complications: Secondary | ICD-10-CM | POA: Diagnosis not present

## 2022-08-18 ENCOUNTER — Encounter: Payer: Self-pay | Admitting: Podiatry

## 2022-08-18 ENCOUNTER — Other Ambulatory Visit: Payer: Self-pay | Admitting: Podiatry

## 2022-08-18 MED ORDER — TRAMADOL HCL 50 MG PO TABS: 50.0000 mg | ORAL_TABLET | Freq: Four times a day (QID) | ORAL | 0 refills | Status: AC | PRN

## 2022-08-25 ENCOUNTER — Ambulatory Visit (INDEPENDENT_AMBULATORY_CARE_PROVIDER_SITE_OTHER): Payer: Medicare Other

## 2022-08-25 ENCOUNTER — Ambulatory Visit (INDEPENDENT_AMBULATORY_CARE_PROVIDER_SITE_OTHER): Payer: Medicare Other | Admitting: Podiatry

## 2022-08-25 DIAGNOSIS — T1490XA Injury, unspecified, initial encounter: Secondary | ICD-10-CM

## 2022-08-25 DIAGNOSIS — S82891A Other fracture of right lower leg, initial encounter for closed fracture: Secondary | ICD-10-CM | POA: Diagnosis not present

## 2022-08-25 MED ORDER — HYDROCODONE-ACETAMINOPHEN 10-325 MG PO TABS: 1.0000 | ORAL_TABLET | ORAL | 0 refills | Status: AC | PRN

## 2022-08-25 NOTE — Progress Notes (Signed)
.  Chief Complaint  Patient presents with   Foot Injury    Foot was injured by a 9 lb yorkie who jumped on patients foot while playing, pain located in midfoot    HPI: 72 y.o. female presenting today for evaluation of right ankle pain that has been ongoing for the past few weeks now.  Patient states that on 08/11/2022 her husband was throwing a ball and her dog jumped on her foot while playing.  She has been having pain and tenderness ever since that injury.  She is unable to ambulate without pain.  She does have a history of Charcot neuroarthropathy to the right foot.  Presenting for further treatment evaluation  Past Medical History:  Diagnosis Date   DM (diabetes mellitus) (HCC)    GERD (gastroesophageal reflux disease)    Gout    Hyperlipidemia    Hypertension     Past Surgical History:  Procedure Laterality Date   BIOPSY  02/13/2021   Procedure: BIOPSY;  Surgeon: Dolores Frame, MD;  [facility];  Service: Gastroenterology;;   ESOPHAGEAL DILATION N/A 02/13/2021   Procedure: ESOPHAGEAL DILATION;  Surgeon: Dolores Frame, MD;  [facility];  Service: Gastroenterology;  Laterality: N/A;   ESOPHAGOGASTRODUODENOSCOPY N/A 11/07/2020   Cirigliano: r/t food impaction in lower third of esophagus, food bolus advanced into stomach with air insufflation and gentle endoscopic pressure, small hh, bening appearing esphageal stenosis, normal stomach and duodenum, no specimens   ESOPHAGOGASTRODUODENOSCOPY (EGD) WITH PROPOFOL N/A 02/13/2021   Procedure: ESOPHAGOGASTRODUODENOSCOPY (EGD) WITH PROPOFOL;  Surgeon: Dolores Frame, MD;  [facility];  Service: Gastroenterology;  Laterality: N/A;   FOREIGN BODY REMOVAL  11/07/2020   Procedure: FOREIGN BODY REMOVAL;  Surgeon: Shellia Cleverly, DO;  [facility];  Service: Gastroenterology;;   REVERSE SHOULDER ARTHROPLASTY Right 09/17/2020   Procedure: REVERSE SHOULDER  ARTHROPLASTY;  Surgeon: Bjorn Pippin, MD;  Location: [facility];  Service: Orthopedics;  Laterality: Right;    Allergies  Allergen Reactions   Lisinopril Diarrhea   Metformin Diarrhea     Physical Exam: General: The patient is alert and oriented x3 in no acute distress.  Dermatology: Skin is warm, dry and supple bilateral lower extremities.   Vascular: Palpable pedal pulses bilaterally. Capillary refill within normal limits.  No appreciable edema.  No erythema.  Neurological: Grossly intact via light touch  Musculoskeletal Exam: No pedal deformities noted  Radiographic Exam RT foot and ankle 08/25/2022:  Stable Charcot neuroarthropathy of the right foot.  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  Moderate degenerative changes noted throughout the midtarsal joint bilateral.  Subacute minimally displaced transverse fibular fracture noted on ankle x-rays proximal to the tibiotalar joint.  Callus formation also noted around the fracture fragment  Assessment/Plan of Care: 1.  Charcot neuroarthropathy right foot; stable 2.  Subacute fibular ankle fracture right 3.  Injury secondary to dog incident.  DOI: 08/11/2022  -Patient evaluated.  X-rays reviewed -Patient has both a Charcot restraint orthotic walker as well as a cam boot at home.  Recommend wearing one of the other to help stabilize the foot and ankle -Prescription for Vicodin 5/3 2 5  mg as needed acute pain -Return to clinic 6 weeks follow-up x-ray      Felecia Shelling, DPM [facility]  Dr. Felecia Shelling, DPM    [address]  [place], [place] [address]                Office [phone]  Fax [phone]

## 2022-10-06 ENCOUNTER — Ambulatory Visit: Payer: Medicare Other | Admitting: Podiatry

## 2022-10-11 ENCOUNTER — Ambulatory Visit (INDEPENDENT_AMBULATORY_CARE_PROVIDER_SITE_OTHER): Payer: Medicare Other | Admitting: Podiatry

## 2022-10-11 ENCOUNTER — Ambulatory Visit (INDEPENDENT_AMBULATORY_CARE_PROVIDER_SITE_OTHER): Payer: Medicare Other

## 2022-10-11 DIAGNOSIS — S82891A Other fracture of right lower leg, initial encounter for closed fracture: Secondary | ICD-10-CM | POA: Diagnosis not present

## 2022-10-11 NOTE — Progress Notes (Signed)
.  Chief Complaint  Patient presents with   Fracture    Right foot ankle fracture, follow-up patient denies any pain     HPI: 72 y.o. female presenting today for follow-up evaluation of right ankle pain secondary to fibular fracture.  Patient states that she is feeling much better.  Overall improvement.   Brief history: Patient states that on 08/11/2022 her husband was throwing a ball and her dog jumped on her foot while playing.  She was having tenderness ever since the injury.  She is was and able to ambulate without pain.  She does have a history of Charcot neuroarthropathy to the right foot.  Past Medical History:  Diagnosis Date   DM (diabetes mellitus) (HCC)    GERD (gastroesophageal reflux disease)    Gout    Hyperlipidemia    Hypertension     Past Surgical History:  Procedure Laterality Date   BIOPSY  02/13/2021   Procedure: BIOPSY;  Surgeon: Dolores Frame, MD;  [facility];  Service: Gastroenterology;;   ESOPHAGEAL DILATION N/A 02/13/2021   Procedure: ESOPHAGEAL DILATION;  Surgeon: Dolores Frame, MD;  [facility];  Service: Gastroenterology;  Laterality: N/A;   ESOPHAGOGASTRODUODENOSCOPY N/A 11/07/2020   Cirigliano: r/t food impaction in lower third of esophagus, food bolus advanced into stomach with air insufflation and gentle endoscopic pressure, small hh, bening appearing esphageal stenosis, normal stomach and duodenum, no specimens   ESOPHAGOGASTRODUODENOSCOPY (EGD) WITH PROPOFOL N/A 02/13/2021   Procedure: ESOPHAGOGASTRODUODENOSCOPY (EGD) WITH PROPOFOL;  Surgeon: Dolores Frame, MD;  [facility];  Service: Gastroenterology;  Laterality: N/A;   FOREIGN BODY REMOVAL  11/07/2020   Procedure: FOREIGN BODY REMOVAL;  Surgeon: Shellia Cleverly, DO;  [facility];  Service: Gastroenterology;;   REVERSE SHOULDER ARTHROPLASTY Right 09/17/2020   Procedure: REVERSE SHOULDER ARTHROPLASTY;  Surgeon:  Bjorn Pippin, MD;  Location: [facility];  Service: Orthopedics;  Laterality: Right;    Allergies  Allergen Reactions   Lisinopril Diarrhea   Metformin Diarrhea     Physical Exam: General: The patient is alert and oriented x3 in no acute distress.  Dermatology: Skin is warm, dry and supple bilateral lower extremities.   Vascular: Palpable pedal pulses bilaterally. Capillary refill within normal limits.  No appreciable edema.  No erythema.  Neurological: Grossly intact via light touch  Musculoskeletal Exam: No pedal deformities noted.  No pain throughout palpation of the right foot and ankle  Radiographic Exam RT foot and ankle 10/19/2022:  Mostly unchanged.  Stable Charcot neuroarthropathy of the right foot.  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  Moderate degenerative changes noted throughout the midtarsal joint bilateral.  Subacute minimally displaced transverse fibular fracture noted on ankle x-rays proximal to the tibiotalar joint with demonstration of routine healing.  Callus formation also noted around the fracture fragment  Assessment/Plan of Care: 1.  Charcot neuroarthropathy right foot; stable 2.  Subacute fibular ankle fracture right; with routine healing 3.  Injury secondary to dog incident.  DOI: 08/11/2022  -Patient evaluated.  X-rays reviewed.  Overall there is improvement and stabilization of the fibular fracture to the right lower extremity.  Patient may discontinue the cam walker -Patient has both a Charcot restraint orthotic walker as well as a cam boot at home.   -Patient has minimal pain or tenderness associated to the foot and ankle. -Return to clinic 3 months for routine footcare and follow-up x-ray  Felecia Shelling, DPM [facility]  Dr. Felecia Shelling, DPM    [address] [place], [place] [address]                Office [phone]  Fax [phone]

## 2022-10-26 ENCOUNTER — Encounter: Payer: Self-pay | Admitting: Podiatry

## 2022-10-26 ENCOUNTER — Ambulatory Visit (INDEPENDENT_AMBULATORY_CARE_PROVIDER_SITE_OTHER): Payer: Medicare Other | Admitting: Podiatry

## 2022-10-26 DIAGNOSIS — E1142 Type 2 diabetes mellitus with diabetic polyneuropathy: Secondary | ICD-10-CM

## 2022-10-26 DIAGNOSIS — L84 Corns and callosities: Secondary | ICD-10-CM | POA: Diagnosis not present

## 2022-10-26 DIAGNOSIS — B351 Tinea unguium: Secondary | ICD-10-CM | POA: Diagnosis not present

## 2022-10-26 DIAGNOSIS — M79674 Pain in right toe(s): Secondary | ICD-10-CM

## 2022-10-26 DIAGNOSIS — M79675 Pain in left toe(s): Secondary | ICD-10-CM | POA: Diagnosis not present

## 2022-10-26 NOTE — Progress Notes (Signed)
This patient returns to my office for at risk foot care.  This patient requires this care by a professional since this patient will be at risk due to having  Diabetes.   This patient is unable to cut nails himself since the patient cannot reach his nails.These nails are painful walking and wearing shoes.   . This patient presents for at risk foot care today.  Patient is now weaing cam walker right foot.  General Appearance  Alert, conversant and in no acute stress.  Vascular  Dorsalis pedis and posterior tibial  pulses are palpable  bilaterally.  Capillary return is within normal limits  bilaterally. Temperature is within normal limits  bilaterally.  Neurologic  Senn-Weinstein monofilament wire test within normal limits  bilaterally. Muscle power within normal limits bilaterally.  Nails Thick disfigured discolored nails with subungual debris  from hallux to fifth toes bilaterally. No evidence of bacterial infection or drainage bilaterally.  Orthopedic  No limitations of motion  feet .  No crepitus or effusions noted.  No bony pathology or digital deformities noted.  PTTD and foot arthritis right foot.  Skin  normotropic skin with no porokeratosis noted bilaterally.  No signs of infections or ulcers noted.     Callus medial aspect right foot due to Charcot foot.  Onychomycosis  Pain in right toes  Pain in left toes  Callus right foot.  Consent was obtained for treatment procedures.   Mechanical debridement of nails 1-5  bilaterally performed with a nail nipper.  Filed with dremel without incident.  Debride callus with dremel tool.   Return office visit   3 months                   Told patient to return for periodic foot care and evaluation due to potential at risk complications.   Helane Gunther DPM

## 2022-11-10 DIAGNOSIS — Z6837 Body mass index (BMI) 37.0-37.9, adult: Secondary | ICD-10-CM | POA: Diagnosis not present

## 2022-11-10 DIAGNOSIS — E1165 Type 2 diabetes mellitus with hyperglycemia: Secondary | ICD-10-CM | POA: Diagnosis not present

## 2022-11-10 DIAGNOSIS — Z299 Encounter for prophylactic measures, unspecified: Secondary | ICD-10-CM | POA: Diagnosis not present

## 2022-11-10 DIAGNOSIS — I10 Essential (primary) hypertension: Secondary | ICD-10-CM | POA: Diagnosis not present

## 2022-11-14 ENCOUNTER — Ambulatory Visit
Admission: RE | Admit: 2022-11-14 | Discharge: 2022-11-14 | Disposition: A | Discharge status: Home / Self Care | Payer: Medicare Other | Source: Ambulatory Visit | Attending: Family Medicine | Admitting: Family Medicine

## 2022-11-14 VITALS — BP 120/72 | HR 72 | Temp 98.7°F | Resp 20

## 2022-11-14 DIAGNOSIS — B9789 Other viral agents as the cause of diseases classified elsewhere: Secondary | ICD-10-CM | POA: Insufficient documentation

## 2022-11-14 DIAGNOSIS — J4521 Mild intermittent asthma with (acute) exacerbation: Secondary | ICD-10-CM | POA: Insufficient documentation

## 2022-11-14 DIAGNOSIS — Z1152 Encounter for screening for COVID-19: Secondary | ICD-10-CM | POA: Insufficient documentation

## 2022-11-14 DIAGNOSIS — J069 Acute upper respiratory infection, unspecified: Secondary | ICD-10-CM | POA: Diagnosis not present

## 2022-11-14 DIAGNOSIS — R059 Cough, unspecified: Secondary | ICD-10-CM | POA: Diagnosis not present

## 2022-11-14 MED ORDER — PROMETHAZINE-DM 6.25-15 MG/5ML PO SYRP: 5.0000 mL | ORAL_SOLUTION | Freq: Four times a day (QID) | ORAL | 0 refills | Status: DC | PRN

## 2022-11-14 MED ORDER — FLUTICASONE PROPIONATE 50 MCG/ACT NA SUSP: 1.0000 | Freq: Two times a day (BID) | NASAL | 2 refills | Status: DC

## 2022-11-14 NOTE — ED Triage Notes (Signed)
Pt reports she has coughing, runny nose, wheezing, watery eyes, SOB, and weak x 2 days   Home covid neg result

## 2022-11-14 NOTE — Discharge Instructions (Signed)
We have tested you for COVID today and should have those results back tomorrow morning.  If positive, someone will reach out and discuss antiviral therapy with you.  In the meantime, continue your over-the-counter medications for symptoms and I have sent over several more things to help.  Drink plenty of fluids, get lots of rest

## 2022-11-15 DIAGNOSIS — Z299 Encounter for prophylactic measures, unspecified: Secondary | ICD-10-CM | POA: Diagnosis not present

## 2022-11-15 DIAGNOSIS — N1831 Chronic kidney disease, stage 3a: Secondary | ICD-10-CM | POA: Diagnosis not present

## 2022-11-15 DIAGNOSIS — D692 Other nonthrombocytopenic purpura: Secondary | ICD-10-CM | POA: Diagnosis not present

## 2022-11-15 DIAGNOSIS — J069 Acute upper respiratory infection, unspecified: Secondary | ICD-10-CM | POA: Diagnosis not present

## 2022-11-15 DIAGNOSIS — Z6837 Body mass index (BMI) 37.0-37.9, adult: Secondary | ICD-10-CM | POA: Diagnosis not present

## 2022-11-17 NOTE — ED Provider Notes (Signed)
[facility]    CSN: [id number] Arrival date & time: 11/14/22  1236      History   Chief Complaint Chief Complaint  Patient presents with   Cough    Entered by patient    HPI Sheila [name] is a 72 y.o. female.   Patient presenting today with 2-day history of hacking cough, chest tightness, wheezing, rhinorrhea, weakness and fatigue.  Denies fever, chest pain, shortness of breath, abdominal pain, nausea vomiting or diarrhea.  Home COVID return from negative.  So far trying over-the-counter remedies with minimal relief.  No known sick contacts.  History of asthma not currently on inhaler therapy.    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   Acute respiratory failure with hypoxia (HCC) 09/17/2020   Gout attack 11/08/2018   Edema 10/24/2018   Ankle pain 06/06/2017   Upper airway cough syndrome 12/16/2015   Abnormal WBC count 02/26/2015   Arthritis 01/22/2015   History of gout 01/22/2015   Mild intermittent asthma without complication 01/22/2015    Past Surgical History:  Procedure Laterality Date   BIOPSY  02/13/2021   Procedure: BIOPSY;  Surgeon: Dolores Frame, MD;  [facility];  Service: Gastroenterology;;   ESOPHAGEAL DILATION N/A 02/13/2021   Procedure: ESOPHAGEAL DILATION;  Surgeon: Dolores Frame, MD;  [facility];  Service: Gastroenterology;  Laterality: N/A;   ESOPHAGOGASTRODUODENOSCOPY N/A 11/07/2020    Cirigliano: r/t food impaction in lower third of esophagus, food bolus advanced into stomach with air insufflation and gentle endoscopic pressure, small hh, bening appearing esphageal stenosis, normal stomach and duodenum, no specimens   ESOPHAGOGASTRODUODENOSCOPY (EGD) WITH PROPOFOL N/A 02/13/2021   Procedure: ESOPHAGOGASTRODUODENOSCOPY (EGD) WITH PROPOFOL;  Surgeon: Dolores Frame, MD;  [facility];  Service: Gastroenterology;  Laterality: N/A;   FOREIGN BODY REMOVAL  11/07/2020   Procedure: FOREIGN BODY REMOVAL;  Surgeon: Shellia Cleverly, DO;  [facility];  Service: Gastroenterology;;   REVERSE SHOULDER ARTHROPLASTY Right 09/17/2020   Procedure: REVERSE SHOULDER ARTHROPLASTY;  Surgeon: Bjorn Pippin, MD;  Location: [facility];  Service: Orthopedics;  Laterality: Right;    OB History   No obstetric history on file.      Home Medications    Prior to Admission medications   Medication Sig Start Date End Date Taking? Authorizing Provider  fluticasone (FLONASE) 50 MCG/ACT nasal spray Place 1 spray into both nostrils 2 (two) times daily. 11/14/22  Yes Particia Nearing, PA-C  promethazine-dextromethorphan (PROMETHAZINE-DM) 6.25-15 MG/5ML syrup Take 5 mLs by mouth 4 (four) times daily as needed. 11/14/22  Yes Particia Nearing, PA-C  ASPERCREME LIDOCAINE 4 % CREA Apply 1 application topically 4 (four) times daily as needed (for pain).    [provider]  CONTOUR NEXT TEST test strip 4 (four) times daily. for testing 03/13/18   [provider]  furosemide (LASIX) 40 MG tablet Take 40 mg by mouth 2 (two)  times daily. 04/26/18   [provider]  GLOBAL EASE INJECT PEN NEEDLES 31G X 5 MM MISC 4 (four) times daily. 04/17/18   [provider]  JARDIANCE 25 MG TABS tablet Take 12.5 mg by mouth in the morning.    [provider]  NOVOLOG FLEXPEN 100 UNIT/ML FlexPen Inject 16 Units into the skin See  admin instructions. Inject 16 units into thee skin two times a day before meals, per sliding scale 03/13/18   [provider]  OVER THE COUNTER MEDICATION Magnessium 500mg  daily  Vit d one daily  Potassium 99mg  one daily    [provider]  pantoprazole (PROTONIX) 40 MG tablet Take 1 tablet (40 mg total) by mouth daily. 02/13/21   Dolores Frame, MD  TRESIBA FLEXTOUCH 200 UNIT/ML SOPN Inject 52 Units into the skin at bedtime. 03/13/18   [provider]  Insulin Detemir (LEVEMIR FLEXTOUCH) 100 UNIT/ML Pen Levemir FlexTouch U-100 Insulin 100 unit/mL (3 mL) subcutaneous pen  05/03/18  [provider]    Family History Family History  Problem Relation Age of Onset   Heart disease Father     Social History Social History   Tobacco Use   Smoking status: Former    Current packs/day: 0.00    Average packs/day: 1 pack/day for 2.0 years (2.0 ttl pk-yrs)    Types: Cigarettes    Start date: 04/19/1973    Quit date: 04/20/1975    Years since quitting: 47.6   Smokeless tobacco: Never  Vaping Use   Vaping status: Never Used  Substance Use Topics   Alcohol use: Never   Drug use: Never     Allergies   Lisinopril and Metformin   Review of Systems Review of Systems Per HPI  Physical Exam Triage Vital Signs ED Triage Vitals  Encounter Vitals Group     BP 11/14/22 1241 120/72     Systolic BP Percentile --      Diastolic BP Percentile --      Pulse Rate 11/14/22 1241 72     Resp 11/14/22 1241 20     Temp 11/14/22 1241 98.7 F (37.1 C)     Temp Source 11/14/22 1241 Oral     SpO2 11/14/22 1241 93 %     Weight --      Height --      Head Circumference --      Peak Flow --      Pain Score 11/14/22 1242 0     Pain Loc --      Pain Education --      Exclude from Growth Chart --    No data found.  Updated Vital Signs BP 120/72 (BP Location: Right Arm)   Pulse 72   Temp 98.7 F (37.1 C) (Oral)   Resp 20   SpO2 93%   Visual  Acuity Right Eye Distance:   Left Eye Distance:   Bilateral Distance:    Right Eye Near:   Left Eye Near:    Bilateral Near:     Physical Exam Vitals and nursing note reviewed.  Constitutional:      Appearance: Normal appearance.  HENT:     Head: Atraumatic.     Right Ear: Tympanic membrane and external ear normal.     Left Ear: Tympanic membrane and external ear normal.     Nose: Rhinorrhea present.     Mouth/Throat:     Mouth: Mucous membranes are moist.     Pharynx: Posterior  oropharyngeal erythema present.  Eyes:     Extraocular Movements: Extraocular movements intact.     Conjunctiva/sclera: Conjunctivae normal.  Cardiovascular:     Rate and Rhythm: Normal rate and regular rhythm.     Heart sounds: Normal heart sounds.  Pulmonary:     Effort: Pulmonary effort is normal.     Breath sounds: Normal breath sounds. No wheezing or rales.  Musculoskeletal:        General: Normal range of motion.     Cervical back: Normal range of motion and neck supple.  Skin:    General: Skin is warm and dry.  Neurological:     Mental Status: She is alert and oriented to person, place, and time.  Psychiatric:        Mood and Affect: Mood normal.        Thought Content: Thought content normal.      UC Treatments / Results  Labs (all labs ordered are listed, but only abnormal results are displayed) Labs Reviewed  SARS CORONAVIRUS 2 (TAT 6-24 HRS)    EKG   Radiology No results found.  Procedures Procedures (including critical care time)  Medications Ordered in UC Medications - No data to display  Initial Impression / Assessment and Plan / UC Course  I have reviewed the triage vital signs and the nursing notes.  Pertinent labs & imaging results that were available during my care of the patient were reviewed by me and considered in my medical decision making (see chart for details).     Vitals and exam overall reassuring today, suspicious for viral respiratory infection  causing a mild asthma exacerbation.  She states she is compensating well without further intervention regarding asthma.  Good candidate for antiviral therapy if positive for COVID, in the meantime we will treat with Phenergan DM, Flonase and supportive over-the-counter medications and home care.  Return for worsening symptoms.  Final Clinical Impressions(s) / UC Diagnoses   Final diagnoses:  Viral URI with cough  Mild intermittent asthma with acute exacerbation     Discharge Instructions      We have tested you for COVID today and should have those results back tomorrow morning.  If positive, someone will reach out and discuss antiviral therapy with you.  In the meantime, continue your over-the-counter medications for symptoms and I have sent over several more things to help.  Drink plenty of fluids, get lots of rest    ED Prescriptions     Medication Sig Dispense Auth. Provider   promethazine-dextromethorphan (PROMETHAZINE-DM) 6.25-15 MG/5ML syrup Take 5 mLs by mouth 4 (four) times daily as needed. 100 mL Particia Nearing, PA-C   fluticasone [facility]) 50 MCG/ACT nasal spray Place 1 spray into both nostrils 2 (two) times daily. 16 g Particia Nearing, [place]      PDMP not reviewed this encounter.   Particia Nearing, [place] 11/17/22 1029

## 2022-11-24 ENCOUNTER — Ambulatory Visit: Payer: Medicare Other | Admitting: Podiatry

## 2022-11-29 DIAGNOSIS — R0981 Nasal congestion: Secondary | ICD-10-CM | POA: Diagnosis not present

## 2022-11-29 DIAGNOSIS — J209 Acute bronchitis, unspecified: Secondary | ICD-10-CM | POA: Diagnosis not present

## 2022-11-29 DIAGNOSIS — Z299 Encounter for prophylactic measures, unspecified: Secondary | ICD-10-CM | POA: Diagnosis not present

## 2022-11-29 DIAGNOSIS — R059 Cough, unspecified: Secondary | ICD-10-CM | POA: Diagnosis not present

## 2022-12-08 DIAGNOSIS — Z1212 Encounter for screening for malignant neoplasm of rectum: Secondary | ICD-10-CM | POA: Diagnosis not present

## 2022-12-08 DIAGNOSIS — Z1211 Encounter for screening for malignant neoplasm of colon: Secondary | ICD-10-CM | POA: Diagnosis not present

## 2023-01-05 DIAGNOSIS — J4540 Moderate persistent asthma, uncomplicated: Secondary | ICD-10-CM | POA: Diagnosis not present

## 2023-01-05 DIAGNOSIS — R0602 Shortness of breath: Secondary | ICD-10-CM | POA: Diagnosis not present

## 2023-01-05 DIAGNOSIS — J45909 Unspecified asthma, uncomplicated: Secondary | ICD-10-CM | POA: Diagnosis not present

## 2023-01-05 DIAGNOSIS — Z299 Encounter for prophylactic measures, unspecified: Secondary | ICD-10-CM | POA: Diagnosis not present

## 2023-01-05 DIAGNOSIS — R5383 Other fatigue: Secondary | ICD-10-CM | POA: Diagnosis not present

## 2023-01-05 DIAGNOSIS — R059 Cough, unspecified: Secondary | ICD-10-CM | POA: Diagnosis not present

## 2023-01-12 ENCOUNTER — Encounter: Payer: Self-pay | Admitting: Podiatry

## 2023-01-12 ENCOUNTER — Ambulatory Visit (INDEPENDENT_AMBULATORY_CARE_PROVIDER_SITE_OTHER): Payer: Medicare Other

## 2023-01-12 ENCOUNTER — Ambulatory Visit (INDEPENDENT_AMBULATORY_CARE_PROVIDER_SITE_OTHER): Payer: Medicare Other | Admitting: Podiatry

## 2023-01-12 VITALS — Ht 60.0 in | Wt 192.0 lb

## 2023-01-12 DIAGNOSIS — M14671 Charcot's joint, right ankle and foot: Secondary | ICD-10-CM | POA: Diagnosis not present

## 2023-01-12 DIAGNOSIS — S82891A Other fracture of right lower leg, initial encounter for closed fracture: Secondary | ICD-10-CM

## 2023-01-12 DIAGNOSIS — E1142 Type 2 diabetes mellitus with diabetic polyneuropathy: Secondary | ICD-10-CM | POA: Diagnosis not present

## 2023-01-12 NOTE — Progress Notes (Signed)
.  Chief Complaint  Patient presents with   Nail Problem    Patient is here for 73M F/U Charcot's joint of foot, right Some callouses as well on right foot    HPI: 72 y.o. female presenting today for follow-up evaluation of right ankle pain secondary to fibular fracture.  Patient states that she is feeling much better.  Overall improvement.  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  She says that overall she is doing very well  Brief history: Patient states that on 08/11/2022 her husband was throwing a ball and her dog jumped on her foot while playing.  She was having tenderness ever since the injury.  She is was and able to ambulate without pain.  She does have a history of Charcot neuroarthropathy to the right foot.  Past Medical History:  Diagnosis Date   DM (diabetes mellitus) (HCC)    GERD (gastroesophageal reflux disease)    Gout    Hyperlipidemia    Hypertension     Past Surgical History:  Procedure Laterality Date   BIOPSY  02/13/2021   Procedure: BIOPSY;  Surgeon: Dolores Frame, MD;  [facility];  Service: Gastroenterology;;   ESOPHAGEAL DILATION N/A 02/13/2021   Procedure: ESOPHAGEAL DILATION;  Surgeon: Dolores Frame, MD;  [facility];  Service: Gastroenterology;  Laterality: N/A;   ESOPHAGOGASTRODUODENOSCOPY N/A 11/07/2020   Cirigliano: r/t food impaction in lower third of esophagus, food bolus advanced into stomach with air insufflation and gentle endoscopic pressure, small hh, bening appearing esphageal stenosis, normal stomach and duodenum, no specimens   ESOPHAGOGASTRODUODENOSCOPY (EGD) WITH PROPOFOL N/A 02/13/2021   Procedure: ESOPHAGOGASTRODUODENOSCOPY (EGD) WITH PROPOFOL;  Surgeon: Dolores Frame, MD;  [facility];  Service: Gastroenterology;  Laterality: N/A;   FOREIGN BODY REMOVAL  11/07/2020   Procedure: FOREIGN BODY REMOVAL;  Surgeon: Shellia Cleverly,  DO;  [facility];  Service: Gastroenterology;;   REVERSE SHOULDER ARTHROPLASTY Right 09/17/2020   Procedure: REVERSE SHOULDER ARTHROPLASTY;  Surgeon: Bjorn Pippin, MD;  Location: [facility];  Service: Orthopedics;  Laterality: Right;    Allergies  Allergen Reactions   Lisinopril Diarrhea   Metformin Diarrhea     Physical Exam: General: The patient is alert and oriented x3 in no acute distress.  Dermatology: Skin is warm, dry and supple bilateral lower extremities.   Vascular: Palpable pedal pulses bilaterally. Capillary refill within normal limits.  No appreciable edema.  No erythema.  Neurological: Grossly intact via light touch  Musculoskeletal Exam: Complete collapse of the medial longitudinal arch of the foot with hindfoot valgus noted.  Findings consistent with her given history of Charcot neuroarthropathy of the foot  Radiographic Exam RT foot and ankle 01/12/2023:  Mostly unchanged.  Stable Charcot neuroarthropathy of the right foot.  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  Moderate degenerative changes noted throughout the midtarsal joint bilateral.  Subacute minimally displaced transverse fibular fracture noted on ankle x-rays proximal to the tibiotalar joint with demonstration of routine healing.  Callus formation also noted around the fracture fragment  Assessment/Plan of Care: 1.  Charcot neuroarthropathy right foot; stable 2.  Subacute fibular ankle fracture right; with routine healing 3.  Injury secondary to dog incident.  DOI: 08/11/2022  -Patient evaluated.  X-rays reviewed.  There is to be improvement and stabilization of the fibular fracture to the right lower extremity.  Patient may discontinue the  cam walker -Patient has both a Charcot restraint orthotic walker as well as a cam boot at home.  Patient prefers to wear the cam boot.  Overall there is stability of the Charcot foot.   Recommend orthopedic shoes with custom molded diabetic insoles as well as needed the [employer]	[place] or cam walker if she is going to be on her feet for prolonged period of time -Appointment with our diabetic shoe department for new shoes and custom molded diabetic insoles x 3.  Order placed -Patient has minimal pain or tenderness associated to the foot and ankle.  Overall good stability to the foot and ankle -Prescription for tramadol 50 mg as needed for occasional foot pain -Return to clinic 3 months for routine footcare with our routine footcare specialist     Felecia Shelling, DPM [facility]  Dr. Felecia Shelling, DPM    [address] [place], [place] [address]                Office [phone]  Fax [phone]

## 2023-01-13 ENCOUNTER — Other Ambulatory Visit: Payer: Self-pay | Admitting: Podiatry

## 2023-01-13 ENCOUNTER — Encounter: Payer: Self-pay | Admitting: Podiatry

## 2023-01-13 MED ORDER — TRAMADOL HCL 50 MG PO TABS
50.0000 mg | ORAL_TABLET | Freq: Three times a day (TID) | ORAL | 0 refills | Status: AC | PRN
Start: 2023-01-13 — End: 2023-01-20

## 2023-01-13 NOTE — Progress Notes (Signed)
Sent in patient's Tramadol 50mg  #20 tabs to her pharmacy

## 2023-01-14 ENCOUNTER — Telehealth: Payer: Self-pay

## 2023-01-14 ENCOUNTER — Encounter: Payer: Self-pay | Admitting: Internal Medicine

## 2023-01-14 ENCOUNTER — Ambulatory Visit (INDEPENDENT_AMBULATORY_CARE_PROVIDER_SITE_OTHER): Payer: Medicare Other | Admitting: Internal Medicine

## 2023-01-14 VITALS — BP 145/75 | HR 77 | Ht 60.0 in | Wt 196.0 lb

## 2023-01-14 DIAGNOSIS — R058 Other specified cough: Secondary | ICD-10-CM

## 2023-01-14 DIAGNOSIS — J4520 Mild intermittent asthma, uncomplicated: Secondary | ICD-10-CM

## 2023-01-14 MED ORDER — METHYLPREDNISOLONE ACETATE 80 MG/ML IJ SUSP
120.0000 mg | Freq: Once | INTRAMUSCULAR | Status: AC
Start: 2023-01-14 — End: 2023-01-14
  Administered 2023-01-14: 120 mg via INTRAMUSCULAR

## 2023-01-14 MED ORDER — FAMOTIDINE 20 MG PO TABS: ORAL_TABLET | ORAL | 11 refills | Status: AC

## 2023-01-14 MED ORDER — PANTOPRAZOLE SODIUM 40 MG PO TBEC: 40.0000 mg | DELAYED_RELEASE_TABLET | Freq: Every day | ORAL | 2 refills | Status: AC

## 2023-01-14 MED ORDER — BUDESONIDE-FORMOTEROL FUMARATE 80-4.5 MCG/ACT IN AERO: INHALATION_SPRAY | RESPIRATORY_TRACT | 12 refills | Status: DC

## 2023-01-14 MED ORDER — MOMETASONE FURO-FORMOTEROL FUM 100-5 MCG/ACT IN AERO: INHALATION_SPRAY | RESPIRATORY_TRACT | 0 refills | Status: DC

## 2023-01-14 MED ORDER — TRAMADOL HCL 50 MG PO TABS: 50.0000 mg | ORAL_TABLET | ORAL | 0 refills | Status: AC | PRN

## 2023-01-14 MED ORDER — BREZTRI AEROSPHERE 160-9-4.8 MCG/ACT IN AERO: 2.0000 | INHALATION_SPRAY | Freq: Two times a day (BID) | RESPIRATORY_TRACT | Status: DC

## 2023-01-14 NOTE — Assessment & Plan Note (Signed)
01/14/2023  After extensive coaching inhaler device,  effectiveness =    50% try dulera 100 2bid ased on hx of response to air supra

## 2023-01-14 NOTE — Telephone Encounter (Signed)
Left message for pharmacy to replace symbicort 80 with dulera 100 same rx

## 2023-01-14 NOTE — Patient Instructions (Addendum)
Dulera 100 take 2 puffs first thing in am and then another 2 puffs about 12 hours later.  (Can substitute the mustard colored inhaler breztri but just one twice daily)   Only use your albuterol as a rescue medication to be used if you can't catch your breath by resting or doing a relaxed purse lip breathing pattern.  - The less you use it, the better it will work when you need it. - Ok to use up to 2 puffs  every 4 hours if you must but call for immediate appointment if use goes up over your usual need - Don't leave home without it !!  (think of it like the spare tire for your car)    Take delsym (over the counter)  two tsp every 12 hours and supplement if needed with  tramadol 50 mg up to 1- 2 every 4 hours to suppress the urge to cough. Swallowing water and/or using ice chips/non mint and menthol containing candies (such as lifesavers or sugarless jolly ranchers) are also effective.  You should rest your voice and avoid activities that you know make you cough.  Once you have eliminated the cough for 3 straight days try reducing the tramadol first,  then the delsym as tolerated.    Depomedrol 120 mg IM   Pantoprazole (protonix) 40 mg   Take  30-60 min before first meal of the day and Pepcid (famotidine)  20 mg after supper until return to office - this is the best way to tell whether stomach acid is contributing to your problem.    Please remember to go to the  x-ray department  @  [facility] for your tests - we will call you with the results when they are available     Please schedule a follow up office visit in 4 weeks, sooner if needed  with all medications /inhalers/ solutions in hand so we can verify exactly what you are taking. This includes all medications from all doctors and over the counters

## 2023-01-14 NOTE — Progress Notes (Unsigned)
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 CAP LL with radiographic clearance but very little clinical correlation.    History of Present Illness  12/16/2015 1st [facility] office visit/ [facility]  Chief Complaint  Patient presents with   Pulmonary Consult    Referred by Dr. Sherril Croon. Sheila [name] states she was dxed with Asthma 5 yrs ago. She c/o cough for the past several yrs. Cough is prod at times with clear to white sputum.  Cough is esp in the am. Sometimes it wakes her up at night. Cough is triggered by strong smells.   severe cough esp immediately p stirring in am but not like an alarm clock  ? Some better afternoon unless talking then much worse Neb works better than saba hfa  Prednisone did not help at all Assoc with some sneezing neg resp to allegra/clariton/ zytec but minimal sense of pnds rec Pantoprazole (protonix) 40 mg   Take  30-60 min before first meal of the day and  prilosec 20 mg x 2 x 30 min before supper Stop Breo Gabapentin 100 mg three times daily  GERD diet     02/02/2016  f/u ov/Chany Woolworth re: recurrent cough x singulair and max for Reflux / neurontin 100 mg tid / no need for saba  Chief Complaint  Patient presents with   Follow-up    Sheila [name]. still has a slight dry cough, Sheila [name]. denies wheezing,chest pain,sob  hard rock candy helping  / back to work but hasn't been exposed to any of the typical triggers yet Rec If cough flares, increase  gabapentin to 300 mg three times daily. For itching/sneezing drainage / throat tickle try take CHLORPHENIRAMINE  4 mg     01/14/2023 Re-establish  ov/[place] office/Derrick Orris re: recurrent cough p hairspray exp  maint on famotine   Chief Complaint  Patient presents with   Establish Care    Chronic bronchitis  Dyspnea:  at times room to room / 15 min walk but stops 2-3 times flat driveway  Does do tricycle around dead end road x 30 min  Cough: dry hack to point of gag  Sleeping: bed is flat with 2 pillows s resp cc  SABA use: albuterol helps for sev hours has neb not using  02: none      No obvious day to day or daytime variability or assoc excess/ purulent sputum or mucus plugs or hemoptysis or cp or chest tightness, subjective wheeze or overt sinus or hb symptoms.    Also denies any obvious fluctuation of symptoms with weather or environmental changes or other aggravating or alleviating factors except as outlined above   No unusual exposure hx or h/o childhood pna/ asthma or knowledge of premature birth.  Current Allergies, Complete Past Medical History, Past Surgical History, Family History, and Social History were reviewed in [employer] record.  ROS  The following are not active complaints unless bolded Hoarseness, sore throat, dysphagia, dental problems, itching, sneezing,  nasal congestion or discharge of excess mucus or purulent secretions, ear ache,   fever, chills, sweats, unintended wt loss or wt gain, classically pleuritic or exertional cp,  orthopnea pnd or arm/hand swelling  or leg swelling, presyncope, palpitations, abdominal pain, anorexia, nausea, vomiting, diarrhea  or change in bowel habits or change in bladder habits, change in stools or change in urine, dysuria, hematuria,  rash, arthralgias, visual complaints, headache, numbness, weakness or ataxia or problems with walking or coordination,  change in mood or  memory.        Current Meds   Medication Sig   albuterol (VENTOLIN HFA) 108 (90 Base) MCG/ACT inhaler Inhale 2 puffs into the lungs every 6 (six) hours as needed for wheezing or shortness of breath.   ASPERCREME LIDOCAINE 4 % CREA Apply 1 application topically 4 (four) times daily as needed (for pain).   CONTOUR NEXT TEST test strip 4 (four) times daily. for testing   fluticasone (FLONASE) 50 MCG/ACT nasal spray Place 1 spray into both nostrils 2 (two) times daily.   furosemide (LASIX) 40 MG tablet Take 40 mg by mouth 2 (two) times daily.   GLOBAL EASE INJECT PEN NEEDLES 31G X 5 MM MISC 4 (four) times daily.   insulin aspart (FIASP FLEXTOUCH) 100 UNIT/ML FlexTouch Pen Inject into the skin.   JARDIANCE 25 MG TABS tablet Take 12.5 mg by mouth in the morning.   Magnesium 500 MG CAPS Take by mouth.   OVER THE COUNTER MEDICATION Magnessium 500mg  daily  Vit d one daily  Potassium 99mg  one daily   pantoprazole (PROTONIX) 40 MG tablet Take 1 tablet (40 mg total) by mouth daily.   Potassium 99 MG TABS Take by mouth.   promethazine-dextromethorphan (PROMETHAZINE-DM) 6.25-15 MG/5ML syrup Take 5 mLs by mouth 4 (four) times daily as needed.   traMADol (ULTRAM) 50 MG tablet Take 1 tablet (50 mg total) by mouth every 8 (eight) hours as needed for up to 7 days.   TRESIBA FLEXTOUCH 200 UNIT/ML SOPN Inject 52 Units into the skin at bedtime.   VITAMIN D PO Take by mouth.                            Objective:   Physical Exam   Wts  01/14/2023        ***   02/02/16 172 lb 9.6 oz (78.3 kg)  12/16/15 171 lb (77.6 kg)             Assessment & Plan:

## 2023-01-14 NOTE — Telephone Encounter (Signed)
Per [employer] Symbicort is not covered on patient's formulary.  Alternatives are: Advair Lavada Mesi  Please advise, thank you!

## 2023-01-15 ENCOUNTER — Encounter: Payer: Self-pay | Admitting: Internal Medicine

## 2023-01-15 NOTE — Assessment & Plan Note (Signed)
FENO 12/16/2015  =   5 - Spirometry 12/16/2015  wnl including fef 25-75 with active symptoms  - Allergy profile 12/16/2015 >  Eos 0.3 /  IgE  97 min pos RAST dust/ cockroach - 02/02/2016 trial of gabapentin 100 tid >  Resolved as of ov 02/02/2016  - abrupt flare July 2024 off maint gerd rx > 01/14/2023 resume max gerd rx cyclical cough with  tramadol   Of the three most common causes of  Sub-acute / recurrent or chronic cough, only one (GERD)  can actually contribute to/ trigger  the other two (asthma and post nasal drip syndrome)  and perpetuate the cylce of cough.  While not intuitively obvious, many patients with chronic low grade reflux do not cough until there is a primary insult that disturbs the protective epithelial barrier and exposes sensitive nerve endings.   This is typically viral but can due to PNDS and  either may apply here.     >>>  The point is that once this occurs, it is difficult to eliminate the cycle  using anything but a maximally effective acid suppression regimen at least in the short run, accompanied by an appropriate diet to address non acid GERD and control / eliminate the cough itself for at least 3 days with tramadol then return in 4 weeks with all meds in hand using a trust but verify approach to confirm accurate Medication  Reconciliation The principal here is that until we are certain that the  patients are doing what we've asked, it makes no sense to ask them to do more.

## 2023-01-17 ENCOUNTER — Ambulatory Visit (HOSPITAL_COMMUNITY)
Admission: RE | Admit: 2023-01-17 | Discharge: 2023-01-17 | Disposition: A | Discharge status: Home / Self Care | Payer: Medicare Other | Source: Ambulatory Visit | Attending: Internal Medicine | Admitting: Internal Medicine

## 2023-01-17 DIAGNOSIS — Z96611 Presence of right artificial shoulder joint: Secondary | ICD-10-CM | POA: Diagnosis not present

## 2023-01-17 DIAGNOSIS — J4520 Mild intermittent asthma, uncomplicated: Secondary | ICD-10-CM | POA: Diagnosis not present

## 2023-01-17 DIAGNOSIS — R053 Chronic cough: Secondary | ICD-10-CM | POA: Diagnosis not present

## 2023-01-17 DIAGNOSIS — R062 Wheezing: Secondary | ICD-10-CM | POA: Diagnosis not present

## 2023-01-21 ENCOUNTER — Encounter: Payer: Self-pay | Admitting: Internal Medicine

## 2023-01-26 ENCOUNTER — Ambulatory Visit (INDEPENDENT_AMBULATORY_CARE_PROVIDER_SITE_OTHER): Payer: Medicare Other | Admitting: Podiatry

## 2023-01-26 ENCOUNTER — Encounter: Payer: Self-pay | Admitting: Podiatry

## 2023-01-26 ENCOUNTER — Ambulatory Visit: Payer: Medicare Other

## 2023-01-26 DIAGNOSIS — E1142 Type 2 diabetes mellitus with diabetic polyneuropathy: Secondary | ICD-10-CM

## 2023-01-26 DIAGNOSIS — L84 Corns and callosities: Secondary | ICD-10-CM | POA: Diagnosis not present

## 2023-01-26 DIAGNOSIS — M79674 Pain in right toe(s): Secondary | ICD-10-CM

## 2023-01-26 DIAGNOSIS — M79675 Pain in left toe(s): Secondary | ICD-10-CM | POA: Diagnosis not present

## 2023-01-26 DIAGNOSIS — M14671 Charcot's joint, right ankle and foot: Secondary | ICD-10-CM

## 2023-01-26 DIAGNOSIS — B351 Tinea unguium: Secondary | ICD-10-CM | POA: Diagnosis not present

## 2023-01-26 DIAGNOSIS — M2140 Flat foot [pes planus] (acquired), unspecified foot: Secondary | ICD-10-CM

## 2023-01-26 DIAGNOSIS — M216X1 Other acquired deformities of right foot: Secondary | ICD-10-CM

## 2023-01-26 NOTE — Progress Notes (Signed)
This patient returns to my office for at risk foot care.  This patient requires this care by a professional since this patient will be at risk due to having  Diabetes.   This patient is unable to cut nails himself since the patient cannot reach his nails.These nails are painful walking and wearing shoes.   . This patient presents for at risk foot care today.  Patient is now weaing cam walker right foot.  General Appearance  Alert, conversant and in no acute stress.  Vascular  Dorsalis pedis and posterior tibial  pulses are palpable  bilaterally.  Capillary return is within normal limits  bilaterally. Temperature is within normal limits  bilaterally.  Neurologic  Senn-Weinstein monofilament wire test within normal limits  bilaterally. Muscle power within normal limits bilaterally.  Nails Thick disfigured discolored nails with subungual debris  from hallux to fifth toes bilaterally. No evidence of bacterial infection or drainage bilaterally.  Orthopedic  No limitations of motion  feet .  No crepitus or effusions noted.  No bony pathology or digital deformities noted.  PTTD and foot arthritis right foot.  Skin  normotropic skin with no porokeratosis noted bilaterally.  No signs of infections or ulcers noted.     Callus medial aspect right foot due to Charcot foot.  Onychomycosis  Pain in right toes  Pain in left toes  Callus right foot.  Consent was obtained for treatment procedures.   Mechanical debridement of nails 1-5  bilaterally performed with a nail nipper.  Filed with dremel without incident.  Debride callus   with dremel tool. Patient is brought to [facility] for shoes.   Return office visit   3 months                   Told patient to return for periodic foot care and evaluation due to potential at risk complications.   Helane Gunther DPM

## 2023-01-26 NOTE — Progress Notes (Signed)
Patient presents to the office today for diabetic shoe and insole measuring.  Patient was measured with brannock device to determine size and width for 1 pair of extra depth shoes and foam casted for 3 pair of insoles.   Documentation of medical necessity will be sent to patient's treating diabetic doctor to verify and sign.   Patient's diabetic provider: Leroy Libman  Shoes and insoles will be ordered at that time and patient will be notified for an appointment for fitting when they arrive.   Shoe size (per patient): 8XWD Brannock measurement: 8 Patient shoe selection- Shoe choice:   X527 /X525 Shoe size ordered: 8WD [employer] signed  Addison Bailey Cped, CFo, CFm

## 2023-02-11 DIAGNOSIS — Z299 Encounter for prophylactic measures, unspecified: Secondary | ICD-10-CM | POA: Diagnosis not present

## 2023-02-11 DIAGNOSIS — Z6837 Body mass index (BMI) 37.0-37.9, adult: Secondary | ICD-10-CM | POA: Diagnosis not present

## 2023-02-11 DIAGNOSIS — E1165 Type 2 diabetes mellitus with hyperglycemia: Secondary | ICD-10-CM | POA: Diagnosis not present

## 2023-02-11 DIAGNOSIS — I10 Essential (primary) hypertension: Secondary | ICD-10-CM | POA: Diagnosis not present

## 2023-02-20 NOTE — Progress Notes (Unsigned)
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 CAP LL with radiographic clearance but very little clinical correlation.    History of Present Illness  12/16/2015 1st [name] visit/ [name]  Chief Complaint  Patient presents with   Pulmonary Consult    Referred by Dr. Sherril Croon. Pt states she was dxed with Asthma 5 yrs ago. She c/o cough for the past several yrs. Cough is prod at times with clear to white sputum.  Cough is esp in the am. Sometimes it wakes her up at night. Cough is triggered by strong smells.   severe cough esp immediately p stirring in am but not like an alarm clock  ? Some better afternoon unless talking then much worse Neb works better than saba hfa  Prednisone did not help at all Assoc with some sneezing neg resp to allegra/clariton/ zytec but minimal sense of pnds rec Pantoprazole (protonix) 40 mg   Take  30-60 min before first meal of the day and  prilosec 20 mg x 2 x 30 min before supper Stop Breo Gabapentin 100 mg three times daily  GERD diet     02/02/2016  f/u ov/[name] re: recurrent cough x singulair and max for Reflux / neurontin 100 mg tid / no need for saba  Chief Complaint  Patient presents with   Follow-up    Pt. still has a slight dry cough, Pt. denies wheezing,chest pain,sob  hard rock candy helping  / back to work but hasn't been exposed to any of the typical triggers yet Rec If cough flares, increase  gabapentin to 300 mg three times daily. For itching/sneezing drainage / throat tickle try take CHLORPHENIRAMINE  4 mg     01/14/2023 Re-establish  ov/[name] re: recurrent cough p exp  to strong fumes at hairdresser's  maint on famotidine  once daily  Chief Complaint  Patient presents with   Establish Care    Chronic bronchitis  Dyspnea:  at times room to room / at most can do 15 min walk but stops 2-3 times flat driveway  Does do tricycle around dead end road x 30 min and better with this type of non wt bearing ex  Cough: dry hack to point of gag  Sleeping: bed is flat with 2 pillows s resp cc  SABA use: air supra  helps for sev hours and  has neb not using  02: none  Rec Dulera 100 take 2 puffs first thing in am and then another 2 puffs about 12 hours later.  (Can substitute the mustard colored inhaler breztri but just one twice daily)  Only use your albuterol as a rescue medication Take delsym (over the counter)  two tsp every 12 hours and supplement if needed with  tramadol 50 mg up to 1- 2 every 4 hours. Once you have eliminated the cough for  3 straight days try reducing the tramadol first,  then the delsym as tolerated.   Depomedrol 120 mg IM  Pantoprazole (protonix) 40 mg   Take  30-60 min before first meal of the day and Pepcid (famotidine)  20 mg after supper until return to [name]   Please schedule a follow up [name] visit in 4 weeks, sooner if needed  with all medications /inhalers/ solutions in hand   02/21/2023  f/u ov/[name] re: recurrent cough x 2013  maint on gerd rx/ no inhalers  did  bring meds Chief Complaint  Patient presents with   Upper airway cough syndrome    Dyspnea:  walks with cane or rollator  Cough: resolved to her satisfaction  Sleeping: flat pillow 2 until head s   resp cc  SABA use: albuterol  02: none    No obvious day to day or daytime variability or assoc excess/ purulent sputum or mucus plugs or hemoptysis or cp or  chest tightness, subjective wheeze or overt sinus or hb symptoms.    Also denies any obvious fluctuation of symptoms with weather or environmental changes or other aggravating or alleviating factors except as outlined above   No unusual exposure hx or h/o childhood pna/ asthma or knowledge of premature birth.  Current Allergies, Complete Past Medical History, Past Surgical History, Family History, and Social History were reviewed in [employer] record.  ROS  The following are not active complaints unless bolded Hoarseness, sore throat, dysphagia, dental problems, itching, sneezing,  nasal congestion or discharge of excess mucus or purulent secretions, ear ache,   fever, chills, sweats, unintended wt loss or wt gain, classically pleuritic or exertional cp,  orthopnea pnd or arm/hand swelling  or leg swelling, presyncope, palpitations, abdominal pain, anorexia, nausea, vomiting, diarrhea  or change in bowel habits or change in bladder habits, change in stools or change in urine, dysuria, hematuria,  rash, arthralgias, visual complaints, headache, numbness, weakness or ataxia or problems with walking/uses cane or coordination,  change in mood or  memory.        Current Meds - - NOTE:   Unable to verify as accurately reflecting what pt takes    Medication Sig   albuterol (VENTOLIN HFA) 108 (90 Base) MCG/ACT inhaler Inhale 2 puffs into the lungs every 6 (six) hours as needed for wheezing or shortness of breath.   ASPERCREME LIDOCAINE 4 % CREA Apply 1 application topically 4 (four) times daily as needed (for pain).   Budeson-Glycopyrrol-Formoterol (BREZTRI AEROSPHERE) 160-9-4.8 MCG/ACT AERO Inhale 2 puffs into the lungs in the morning and at bedtime.   budesonide-formoterol (SYMBICORT) 80-4.5 MCG/ACT inhaler Take 2 puffs first thing in am and then another 2 puffs about 12 hours later.   CONTOUR NEXT TEST test strip 4 (four) times daily. for testing   famotidine (PEPCID) 20 MG  tablet One after supper   fluticasone (FLONASE) 50 MCG/ACT nasal spray Place 1 spray into both nostrils 2 (two) times daily.   furosemide (LASIX) 40 MG tablet Take 40 mg by mouth 2 (two) times daily.   GLOBAL EASE INJECT PEN NEEDLES 31G X 5 MM MISC 4 (four) times daily.   insulin aspart (FIASP FLEXTOUCH) 100 UNIT/ML FlexTouch Pen Inject into the skin.   JARDIANCE 25 MG TABS tablet Take 12.5 mg by mouth in the morning.   Magnesium 500 MG CAPS Take by mouth.   mometasone-formoterol (DULERA) 100-5 MCG/ACT AERO Take 2 puffs first thing in am and then another 2  puffs about 12 hours later.   OVER THE COUNTER MEDICATION Magnessium 500mg  daily  Vit d one daily  Potassium 99mg  one daily   pantoprazole (PROTONIX) 40 MG tablet Take 1 tablet (40 mg total) by mouth daily. Take 30-60 min before first meal of the day   Potassium 99 MG TABS Take by mouth.   TRESIBA FLEXTOUCH 200 UNIT/ML SOPN Inject 52 Units into the skin at bedtime.   VITAMIN D PO Take by mouth.                              Objective:   Physical Exam   Wts  02/21/2023        196  01/14/2023        196    02/02/16 172 lb 9.6 oz (78.3 kg)  12/16/15 171 lb (77.6 kg)    Vital signs reviewed  02/21/2023  - Note at rest 02 sats  96% on RA   General appearance:  amb with cane/ mod obese (by BMI) pleasant wf nad    HEENT : Oropharynx  clear      Nasal turbinates nl    NECK :  without  apparent JVD/ palpable Nodes/TM    LUNGS: no acc muscle use,  Nl contour chest which is clear to A and P bilaterally without cough on insp or exp maneuvers   CV:  RRR  no s3 or murmur or increase in P2, and no edema   ABD:  soft and nontender with nl inspiratory excursion in the supine position. No bruits or organomegaly appreciated   MS:  walks with cane /ext warm without deformities Or obvious joint restrictions  calf tenderness, cyanosis or clubbing    SKIN: warm and dry without lesions    NEURO:  alert, approp, nl sensorium with  no  motor or cerebellar deficits apparent.           01/17/23 cxr wnl      Assessment & Plan:

## 2023-02-21 ENCOUNTER — Ambulatory Visit (INDEPENDENT_AMBULATORY_CARE_PROVIDER_SITE_OTHER): Payer: Medicare Other | Admitting: Internal Medicine

## 2023-02-21 ENCOUNTER — Encounter: Payer: Self-pay | Admitting: Internal Medicine

## 2023-02-21 VITALS — BP 125/74 | HR 73 | Ht 60.0 in | Wt 196.0 lb

## 2023-02-21 DIAGNOSIS — J4520 Mild intermittent asthma, uncomplicated: Secondary | ICD-10-CM

## 2023-02-21 DIAGNOSIS — R058 Other specified cough: Secondary | ICD-10-CM | POA: Diagnosis not present

## 2023-02-21 NOTE — Assessment & Plan Note (Addendum)
01/14/2023  After extensive coaching inhaler device,  effectiveness =    50% from a baseline of near 0 so  try dulera 100 2bid based on hx of response to air supra    No evidence of active asthma on max gerd rx/ rec keep saba on hand anyway for prn use           Each maintenance medication was reviewed in detail including emphasizing most importantly the difference between maintenance and prns and under what circumstances the prns are to be triggered using an action plan format where appropriate.  Total time for H and P, chart review, counseling, reviewing hfa device(s) and generating customized AVS unique to this office visit / same day charting = 30 min summary f/u ov

## 2023-02-21 NOTE — Assessment & Plan Note (Signed)
FENO 12/16/2015  =   5 - Spirometry 12/16/2015  wnl including fef 25-75 with active symptoms  - Allergy profile 12/16/2015 >  Eos 0.3 /  IgE  97 min pos RAST dust/ cockroach - 02/02/2016 trial of gabapentin 100 tid >  Resolved as of ov 02/02/2016  - abrupt flare July 2024 off maint gerd rx > 01/14/2023 resume max gerd rx cyclical cough with  tramadol > resolved to her satisfaction 02/21/2023   No evidence of asthma off all resp rx and maint on gerd rx only - keep saba on hand prn but no need for maint resp rx   F/u in 3 m with all meds in hand using a trust but verify approach to confirm accurate Medication  Reconciliation The principal here is that until we are certain that the  patients are doing what we've asked, it makes no sense to ask them to do more.

## 2023-02-21 NOTE — Patient Instructions (Addendum)
No change in medications  and  bring any inhalers you have with you to the next visit  Please schedule a follow up visit in 3 months but call sooner if needed.

## 2023-03-04 DIAGNOSIS — Z23 Encounter for immunization: Secondary | ICD-10-CM | POA: Diagnosis not present

## 2023-03-14 ENCOUNTER — Encounter: Payer: Self-pay | Admitting: Podiatry

## 2023-03-14 ENCOUNTER — Other Ambulatory Visit: Payer: Self-pay | Admitting: Podiatry

## 2023-03-14 MED ORDER — TRAMADOL HCL 50 MG PO TABS
50.0000 mg | ORAL_TABLET | Freq: Four times a day (QID) | ORAL | 0 refills | Status: AC | PRN
Start: 2023-03-14 — End: 2023-03-29

## 2023-03-14 NOTE — Progress Notes (Signed)
PRN chronic foot pain  Felecia Shelling, DPM [facility]  Dr. Felecia Shelling, DPM    [address] [place], [place] [address]                Office [phone]  Fax [phone]

## 2023-03-29 DIAGNOSIS — Z299 Encounter for prophylactic measures, unspecified: Secondary | ICD-10-CM | POA: Diagnosis not present

## 2023-03-29 DIAGNOSIS — E1165 Type 2 diabetes mellitus with hyperglycemia: Secondary | ICD-10-CM | POA: Diagnosis not present

## 2023-03-29 DIAGNOSIS — I10 Essential (primary) hypertension: Secondary | ICD-10-CM | POA: Diagnosis not present

## 2023-03-29 DIAGNOSIS — Z6837 Body mass index (BMI) 37.0-37.9, adult: Secondary | ICD-10-CM | POA: Diagnosis not present

## 2023-03-30 ENCOUNTER — Ambulatory Visit (INDEPENDENT_AMBULATORY_CARE_PROVIDER_SITE_OTHER): Payer: Medicare Other

## 2023-03-30 DIAGNOSIS — E1142 Type 2 diabetes mellitus with diabetic polyneuropathy: Secondary | ICD-10-CM

## 2023-03-30 DIAGNOSIS — M2142 Flat foot [pes planus] (acquired), left foot: Secondary | ICD-10-CM | POA: Diagnosis not present

## 2023-03-30 DIAGNOSIS — M2141 Flat foot [pes planus] (acquired), right foot: Secondary | ICD-10-CM | POA: Diagnosis not present

## 2023-03-30 DIAGNOSIS — L84 Corns and callosities: Secondary | ICD-10-CM

## 2023-03-30 DIAGNOSIS — M2140 Flat foot [pes planus] (acquired), unspecified foot: Secondary | ICD-10-CM

## 2023-04-07 ENCOUNTER — Other Ambulatory Visit: Payer: Self-pay | Admitting: Internal Medicine

## 2023-04-07 DIAGNOSIS — Z1231 Encounter for screening mammogram for malignant neoplasm of breast: Secondary | ICD-10-CM

## 2023-04-07 IMAGING — CT CT SHOULDER*R* W/O CM
1 series · 12 of 14 positions shown, 15 images · non-contrast
Comparison: None.

CLINICAL DATA: Right shoulder pain, decreased range of motion

EXAM:
CT OF THE UPPER RIGHT EXTREMITY WITHOUT CONTRAST
TECHNIQUE: Multidetector CT imaging of the upper right extremity was performed
according to the standard protocol.

[Series 3: soft tissue · axial · 0.53mm/px · z∈[-231,-55]mm · 12 of 106 slices shown, 15 images]
[im 9/106  soft-tissue]
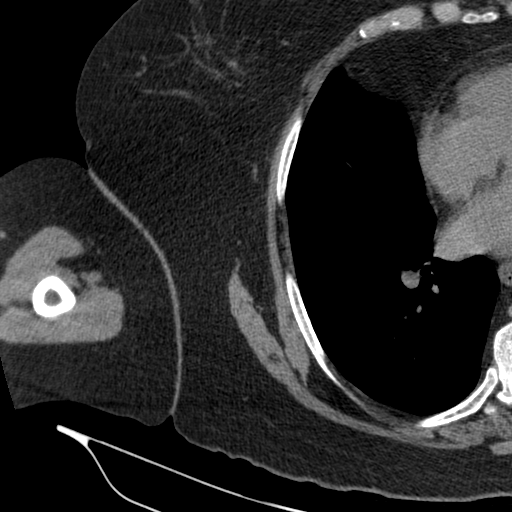
[im 9/106  bone]
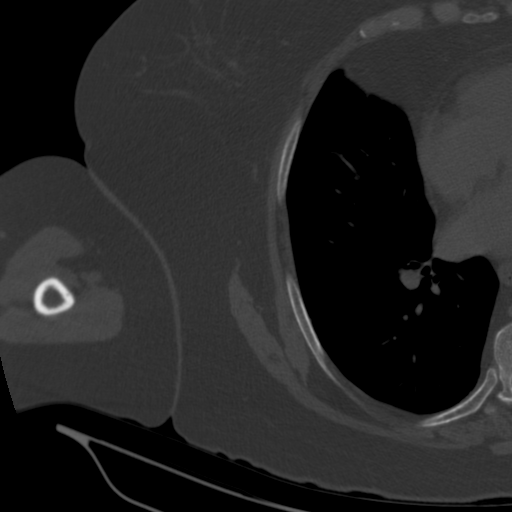
[im 17/106  bone]
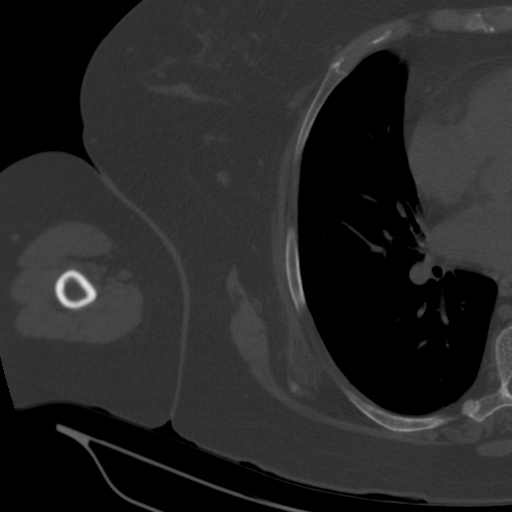
[im 25/106  bone]
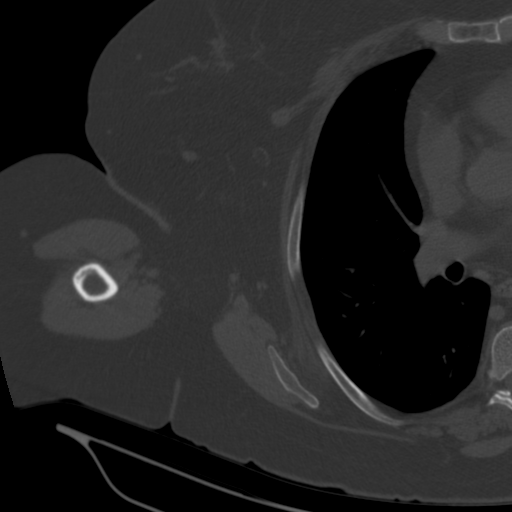
[im 33/106  bone]
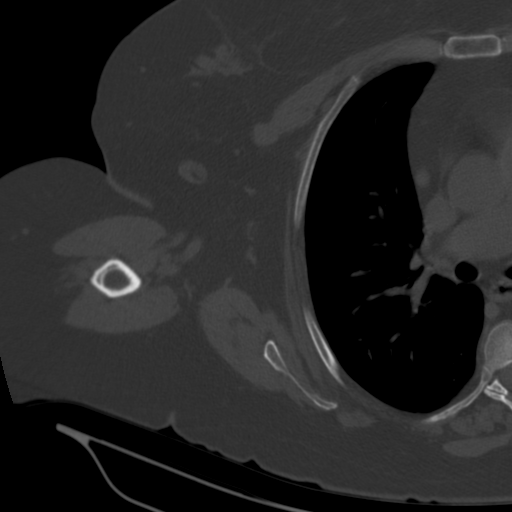
[im 41/106  soft-tissue]
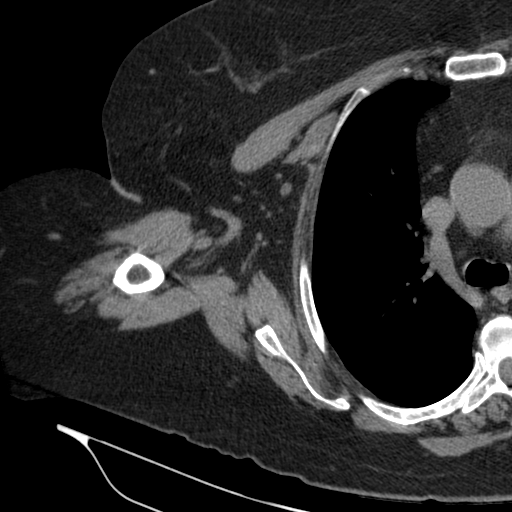
[im 41/106  bone]
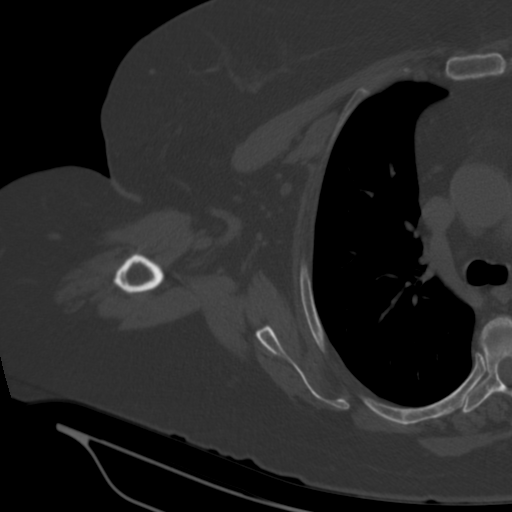
[im 49/106  bone]
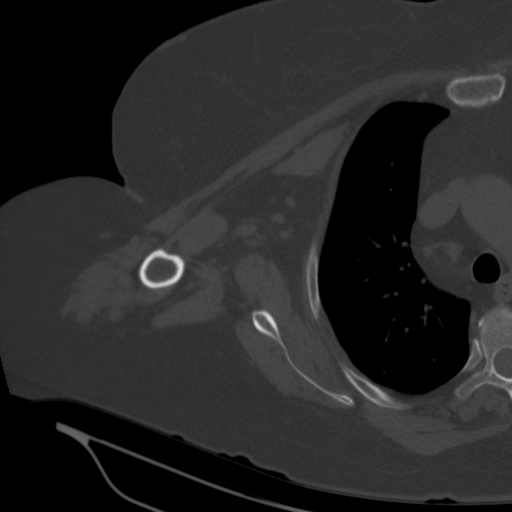
[im 57/106  bone]
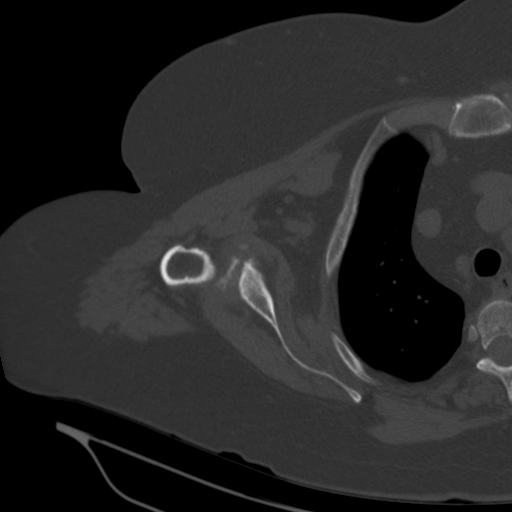
[im 65/106  bone]
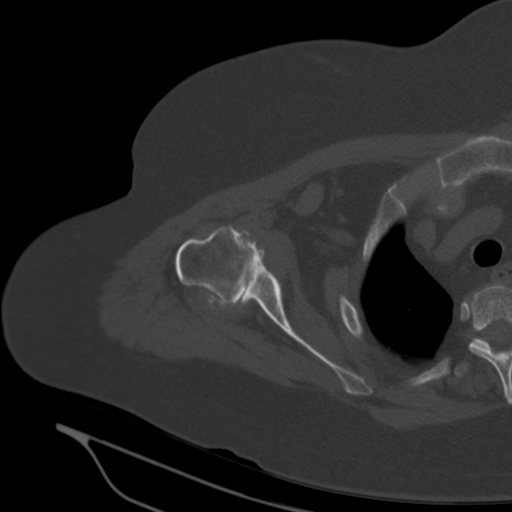
[im 73/106  soft-tissue]
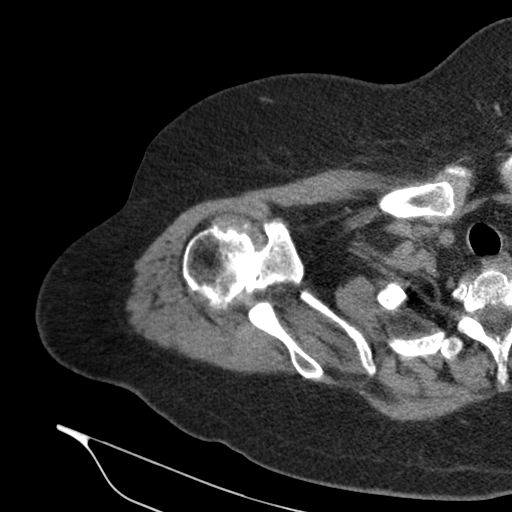
[im 73/106  bone]
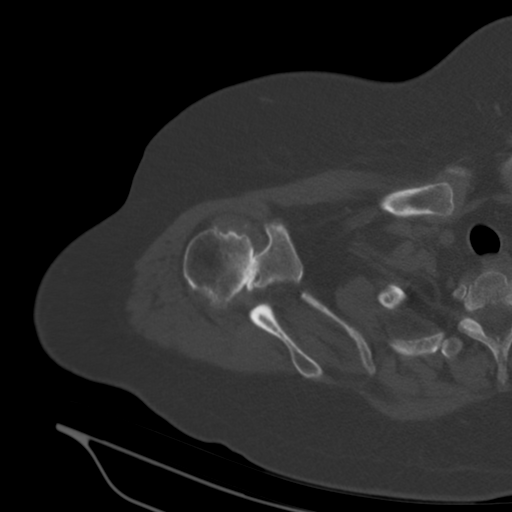
[im 81/106  bone]
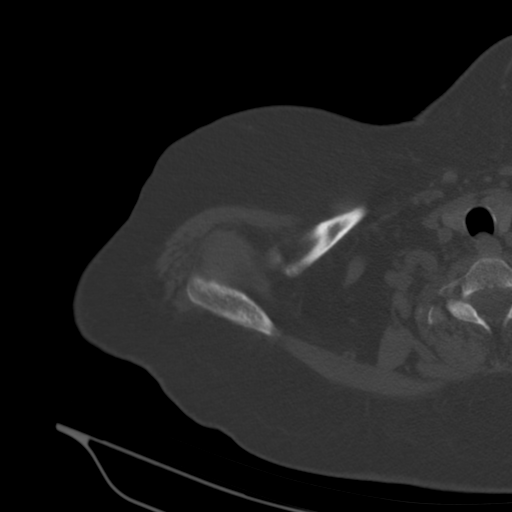
[im 89/106  bone]
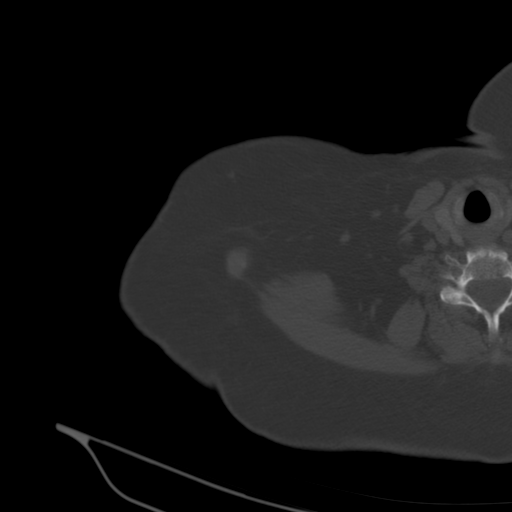
[im 97/106  bone]
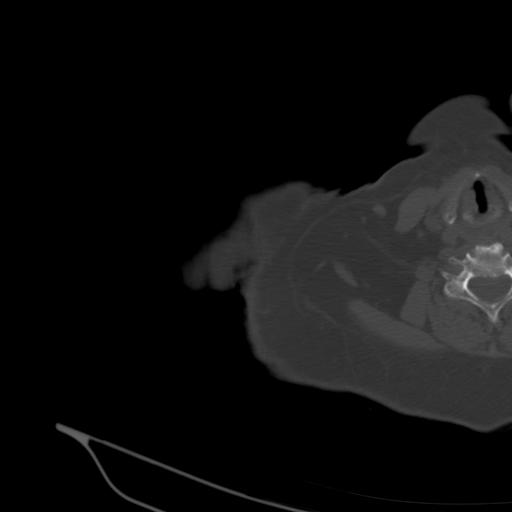

[12 of 14 positions shown; findings below may reference images not displayed]

FINDINGS: Bones/Joint/Cartilage

No fracture or dislocation. Normal alignment. No joint effusion.

Severe osteoarthritis of the glenohumeral joint with severe joint
space narrowing, a bone-on-bone appearance, subchondral sclerosis,
subchondral cystic changes and marginal osteophytosis.

Mild arthropathy of the acromioclavicular joint.  Type I acromion.

Ligaments

Ligaments are suboptimally evaluated by CT.

Muscles and Tendons
Muscles are normal.  No muscle atrophy.

Soft tissue
No fluid collection or hematoma. No soft tissue mass. 3 mm right
middle lobe pulmonary nodule. Second 2 mm right middle lobe
pulmonary nodule.
IMPRESSION: 1. Severe osteoarthritis of the glenohumeral joint.
2. 3 mm right middle lobe pulmonary nodule. Second 2 mm right middle
lobe pulmonary nodule. No follow-up needed if patient is low-risk
(and has no known or suspected primary neoplasm). Non-contrast chest
CT can be considered in 12 months if patient is high-risk. This
recommendation follows the consensus statement: Guidelines for
Management of Incidental Pulmonary Nodules Detected on CT Images:

## 2023-04-07 NOTE — Progress Notes (Signed)

## 2023-04-26 ENCOUNTER — Ambulatory Visit
Admission: RE | Admit: 2023-04-26 | Discharge: 2023-04-26 | Disposition: A | Discharge status: Home / Self Care | Payer: Medicare Other | Source: Ambulatory Visit | Attending: Internal Medicine | Admitting: Internal Medicine

## 2023-04-26 DIAGNOSIS — Z1231 Encounter for screening mammogram for malignant neoplasm of breast: Secondary | ICD-10-CM | POA: Diagnosis not present

## 2023-05-03 IMAGING — DX DG CHEST 1V PORT
1 series · 1 of 1 positions shown · non-contrast
Comparison: Portable exam 0998 hours compared to 2062 hours

CLINICAL DATA: Pneumothorax

EXAM:
PORTABLE CHEST 1 VIEW

[chest ap]
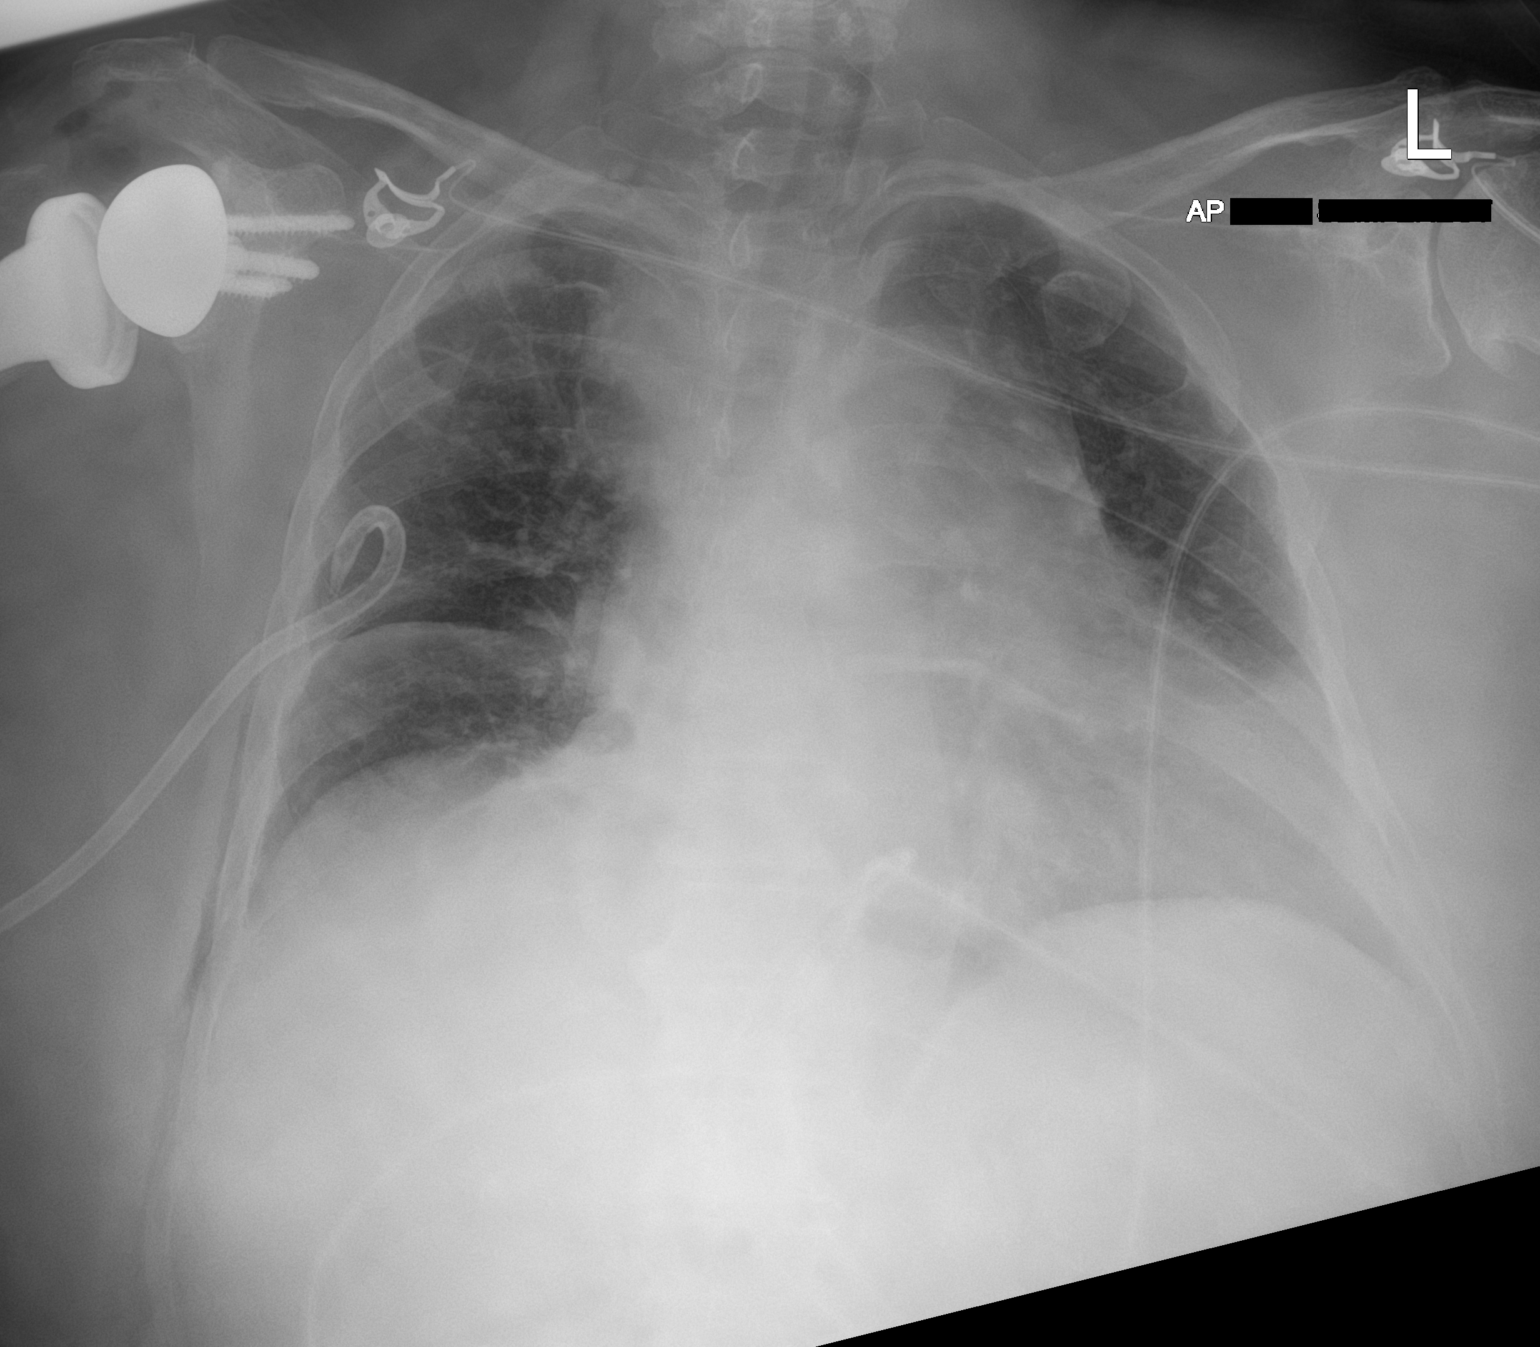

[1 of 1 positions shown; findings below may reference images not displayed]

FINDINGS: Interval placement of pigtail RIGHT thoracostomy tube with
resolution of previously identified RIGHT pneumothorax.

Incomplete re-expansion of RIGHT lung with partial basilar and
medial RIGHT upper lobe atelectasis.

Enlargement of cardiac silhouette.

LEFT lung clear.

Bones demineralized with note of a reverse RIGHT shoulder
arthroplasty.
IMPRESSION: Resolution of previously seen RIGHT pneumothorax post thoracostomy
tube insertion.

Partial atelectasis of medial RIGHT upper lobe with subsegmental
atelectasis at RIGHT base.

## 2023-05-04 IMAGING — DX DG CHEST 1V PORT
1 series · 1 of 1 positions shown · non-contrast
Comparison: September 17, 2020

CLINICAL DATA: Chest tube in place for prior pneumothorax

EXAM:
PORTABLE CHEST 1 VIEW

[chest]
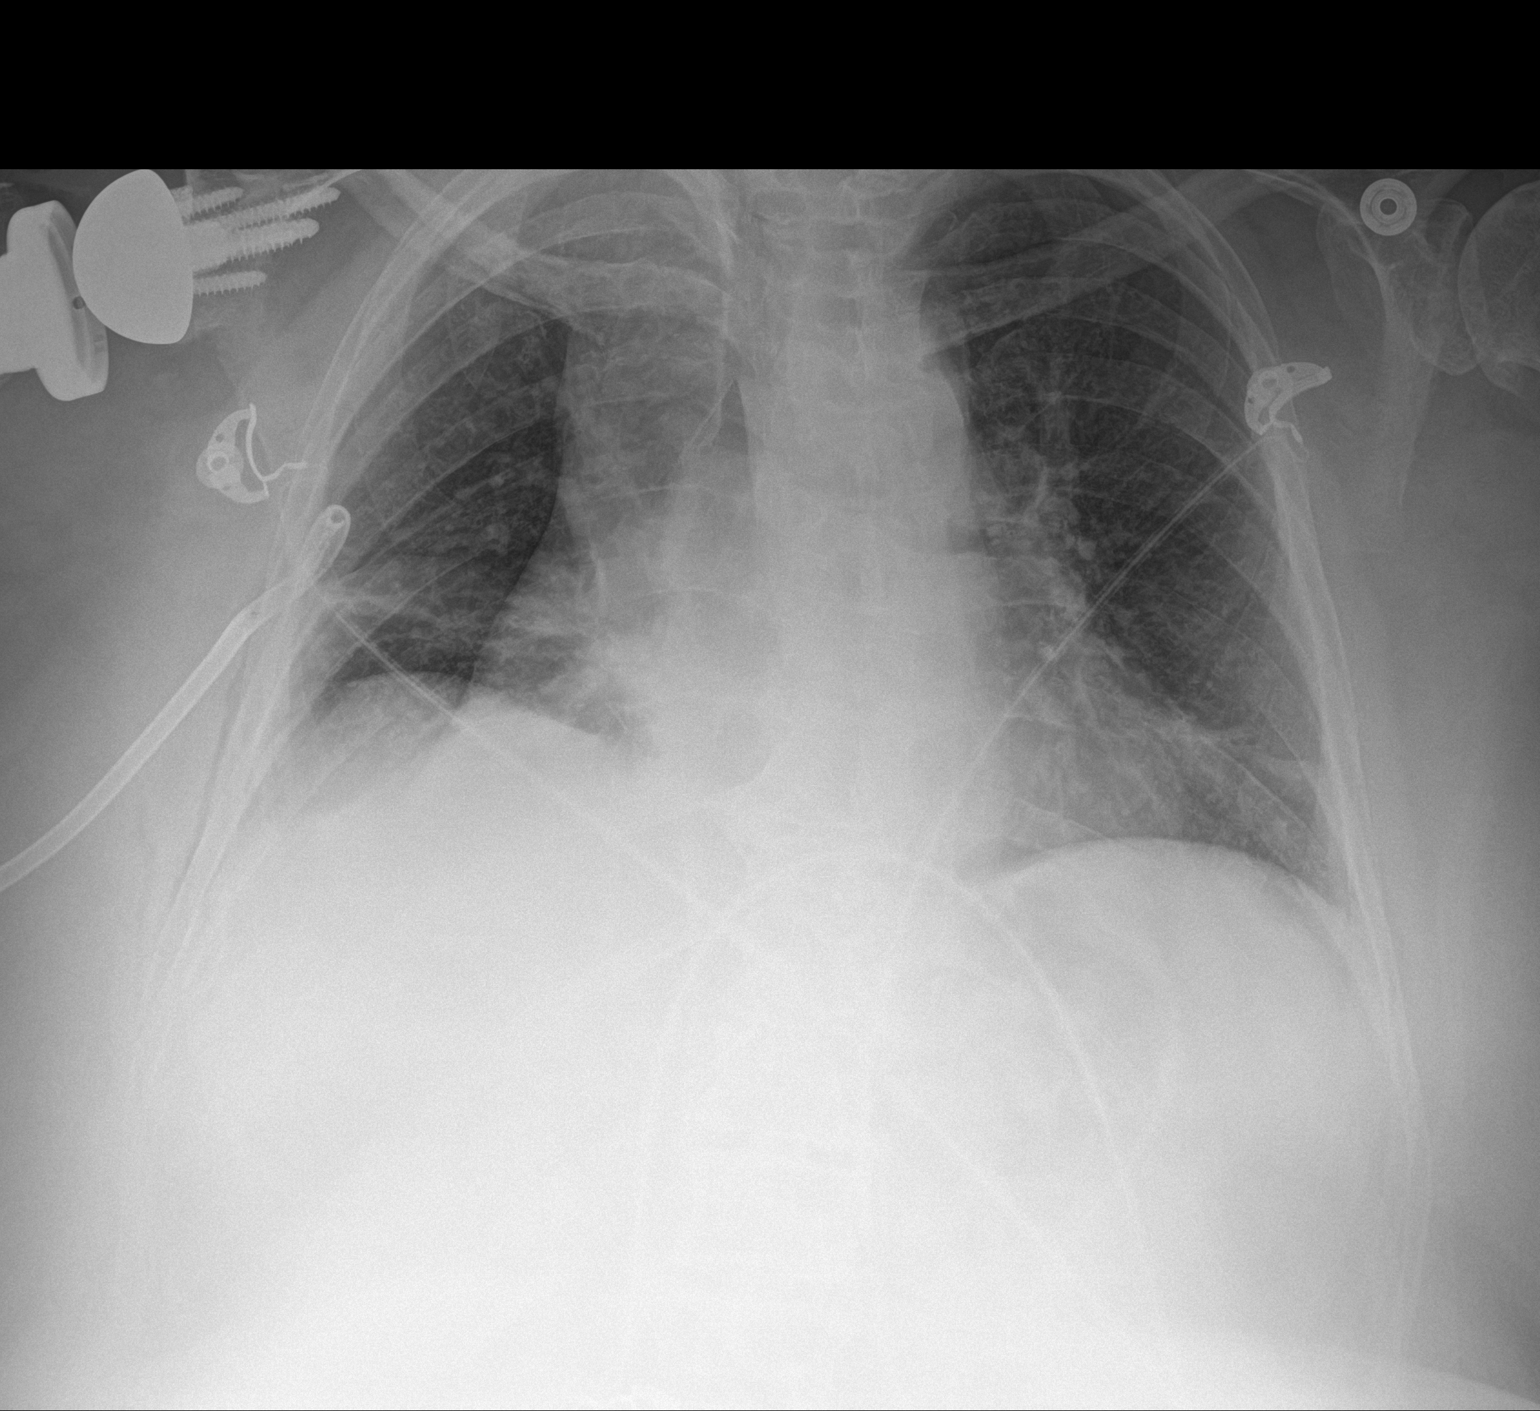

[1 of 1 positions shown; findings below may reference images not displayed]

FINDINGS: Chest tube again noted on the right without demonstrable
pneumothorax. Bibasilar atelectasis noted. Lungs otherwise clear.
Heart is mildly enlarged with pulmonary vascularity normal, stable.
No adenopathy. Total shoulder replacement on the right.
IMPRESSION: Chest tube remains on the right without apparent pneumothorax.
Bibasilar atelectasis. No new opacity. Stable cardiac silhouette.

## 2023-05-05 ENCOUNTER — Ambulatory Visit: Payer: Medicare Other | Admitting: Podiatry

## 2023-05-22 NOTE — Progress Notes (Addendum)
 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 CAP LL with radiographic clearance but very little clinical correlation.    History of Present Illness  12/16/2015 1st [facility]/ [facility]  Chief Complaint  Patient presents with   Pulmonary Consult    Referred by Dr. Rosamond. Pt states she was dxed with Asthma 5 yrs ago. She c/o cough for the past several yrs. Cough is prod at times with clear to white sputum.  Cough is esp in the am. Sometimes it wakes her up at night. Cough is triggered by strong smells.   severe cough esp immediately p stirring in am but not like an alarm clock  ? Some better afternoon unless talking then much worse Neb works better than saba hfa  Prednisone did not help at all Assoc with some sneezing neg resp to allegra/clariton/ zytec but minimal sense of pnds rec Pantoprazole  (protonix ) 40 mg   Take  30-60 min before first meal of the day and  prilosec 20 mg x 2 x 30 min before supper Stop Breo Gabapentin  100 mg three times daily  GERD diet     02/02/2016  f/u ov/Zareya Tuckett re: recurrent cough x singulair and max for Reflux / neurontin  100 mg tid / no need for saba  Chief Complaint  Patient presents with   Follow-up    Pt. still has a slight dry cough, Pt. denies wheezing,chest pain,sob  hard rock candy helping  / back to work but hasn't been exposed to any of the typical triggers yet Rec If cough flares,  increase gabapentin  to 300 mg three times daily. For itching/sneezing drainage / throat tickle try take CHLORPHENIRAMINE  4 mg     01/14/2023 Re-establish  ov/[place] office/Desirae Mancusi re: recurrent cough p exp  to strong fumes at hairdresser's  maint on famotidine   once daily  Chief Complaint  Patient presents with   Establish Care    Chronic bronchitis  Dyspnea:  at times room to room / at most can do 15 min walk but stops 2-3 times flat driveway  Does do tricycle around dead end road x 30 min and better with this type of non wt bearing ex  Cough: dry hack to point of gag  Sleeping: bed is flat with 2 pillows s resp cc  SABA use: air supra  helps for sev hours and  has neb not using  02: none  Rec Dulera 100 take 2 puffs first thing in am and then another 2 puffs about 12 hours later.  (Can substitute the mustard colored inhaler breztri  but just one twice daily)  Only use your albuterol  as a rescue medication Take delsym (over the counter)  two tsp every 12 hours and supplement if needed with  tramadol  50 mg up to 1- 2 every 4 hours. Once you have eliminated the cough  for 3 straight days try reducing the tramadol  first,  then the delsym as tolerated.   Depomedrol 120 mg IM  Pantoprazole  (protonix ) 40 mg   Take  30-60 min before first meal of the day and Pepcid  (famotidine )  20 mg after supper until return to office   Please schedule a follow up office visit in 4 weeks, sooner if needed  with all medications /inhalers/ solutions in hand   02/21/2023  f/u ov/[place] office/Demarques Pilz re: recurrent cough x 2013  maint on gerd rx/ no inhalers  did  bring meds Chief Complaint  Patient presents with   Upper airway cough syndrome   Dyspnea:  walks with cane or rollator  Cough: resolved to her satisfaction  Sleeping: flat pillow 2 until head s   resp cc  SABA use: albuterol   02: none Rec No change in medications  and  bring any inhalers you have with you to the next visit Please schedule a  follow up visit in 3 months but call sooner if needed.   05/24/2023  f/u ov/[place] office/Carlotta Telfair re: cough x 2013  maint on only  prn albuterol   / improved   Dyspnea:  getting around with cane/ mostly goes out in yard / all shopping on line  Has elipse exerciser starting up ex program at home Cough: moslty with perfume  Sleeping: flat bed with 2 pillws s    resp cc  SABA use: up to times  02: none     No obvious day to day or daytime variability or assoc excess/ purulent sputum or mucus plugs or hemoptysis or cp or chest tightness, subjective wheeze or overt sinus or hb symptoms.    Also denies any obvious fluctuation of symptoms with weather or environmental changes or other aggravating or alleviating factors except as outlined above   No unusual exposure hx or h/o childhood pna/ asthma or knowledge of premature birth.  Current Allergies, Complete Past Medical History, Past Surgical History, Family History, and Social History were reviewed in [employer] record.  ROS  The following are not active complaints unless bolded Hoarseness, sore throat, dysphagia, dental problems, itching, sneezing,  nasal congestion or discharge of excess mucus or purulent secretions, ear ache,   fever, chills, sweats, unintended wt loss or wt gain, classically pleuritic or exertional cp,  orthopnea pnd or arm/hand swelling  or leg swelling, presyncope, palpitations, abdominal pain, anorexia, nausea, vomiting, diarrhea  or change in bowel habits or change in bladder habits, change in stools or change in urine, dysuria, hematuria,  rash, arthralgias, visual complaints, headache, numbness, weakness or ataxia or problems with walking or coordination,  change in mood or  memory.        Current Meds  Medication Sig   albuterol  (VENTOLIN  HFA) 108 (90 Base) MCG/ACT inhaler Inhale 2 puffs into the lungs every 6 (six) hours as needed for wheezing or shortness of breath.   ASPERCREME LIDOCAINE  4 %  CREA Apply 1 application topically 4 (four) times daily as needed (for pain).   CONTOUR NEXT TEST test strip 4 (four) times daily. for testing   famotidine  (PEPCID ) 20 MG tablet One after supper   fluticasone  (FLONASE ) 50 MCG/ACT nasal spray Place 1 spray into both nostrils 2 (two) times daily.   furosemide (LASIX) 40 MG tablet Take 40 mg by mouth 2 (two) times daily.   GLOBAL EASE INJECT PEN NEEDLES 31G X 5 MM MISC 4 (four) times daily.   insulin  aspart (FIASP  FLEXTOUCH) 100  UNIT/ML FlexTouch Pen Inject into the skin.   JARDIANCE 25 MG TABS tablet Take 12.5 mg by mouth in the morning.   Magnesium 500 MG CAPS Take by mouth.   OVER THE COUNTER MEDICATION Magnessium 500mg  daily  Vit d one daily  Potassium 99mg  one daily   Potassium 99 MG TABS Take by mouth.   TRESIBA FLEXTOUCH 200 UNIT/ML SOPN Inject 52 Units into the skin at bedtime.   VITAMIN D PO Take by mouth.                               Objective:   Physical Exam   Wts  05/24/2023           196  02/21/2023        196  01/14/2023        196    02/02/16 172 lb 9.6 oz (78.3 kg)  12/16/15 171 lb (77.6 kg)    Vital signs reviewed  05/24/2023  - Note at rest 02 sats  95% on RA   General appearance:    amb MO wf /minimal dry raspy upper airway cough     HEENT : Oropharynx  clear      Nasal turbinates nl    NECK :  without  apparent JVD/ palpable Nodes/TM    LUNGS: no acc muscle use,  Nl contour chest which is clear to A and P bilaterally without cough on insp or exp maneuvers   CV:  RRR  no s3 or murmur or increase in P2, and no edema   ABD:  soft and nontender   MS:  Gait nl   ext warm without deformities Or obvious joint restrictions  calf tenderness, cyanosis or clubbing    SKIN: warm and dry without lesions    NEURO:  alert, approp, nl sensorium with  no motor or cerebellar deficits apparent.                 Assessment & Plan:

## 2023-05-24 ENCOUNTER — Encounter: Payer: Self-pay | Admitting: Internal Medicine

## 2023-05-24 ENCOUNTER — Ambulatory Visit: Payer: Self-pay | Admitting: Internal Medicine

## 2023-05-24 VITALS — BP 148/62 | HR 83 | Ht 60.0 in | Wt 196.0 lb

## 2023-05-24 DIAGNOSIS — R058 Other specified cough: Secondary | ICD-10-CM | POA: Diagnosis not present

## 2023-05-24 NOTE — Patient Instructions (Addendum)
 If cough erupts for any reason >>  add the Pantoprazole  (protonix ) 40 mg   Take  30-60 min before first meal of the day and Pepcid  (famotidine )  20 mg after supper until cough is gone   Call if needing more and more albuterol  than usual  Pulmonary follow up is as needed  - ok to let your PCP do your refills if doing well

## 2023-05-25 NOTE — Assessment & Plan Note (Signed)
 FENO 12/16/2015  =   5 - Spirometry 12/16/2015  wnl including fef 25-75 with active symptoms  - Allergy  profile 12/16/2015 >  Eos 0.3 /  IgE  97 min pos RAST dust/ cockroach - 02/02/2016 trial of gabapentin  100 tid >  Resolved as of ov 02/02/2016  - abrupt flare July 2024 off maint gerd rx > 01/14/2023 resume max gerd rx cyclical cough with  tramadol  > resolved to her satisfaction 02/21/2023   I strongly favor UACS over cough variant asthma though hard to exclude completely so ok to use prn saba as long as remember max rx for GERD if cough flares - in meantime work on wt loss/ f/u prn          Each maintenance medication was reviewed in detail including emphasizing most importantly the difference between maintenance and prns and under what circumstances the prns are to be triggered using an action plan format where appropriate.  Total time for H and P, chart review, counseling, reviewing hfa device(s) and generating customized AVS unique to this office visit / same day charting = 21 min

## 2023-05-31 ENCOUNTER — Ambulatory Visit: Payer: Medicare Other | Admitting: Podiatry

## 2023-06-15 ENCOUNTER — Ambulatory Visit (INDEPENDENT_AMBULATORY_CARE_PROVIDER_SITE_OTHER): Payer: PPO | Admitting: Podiatry

## 2023-06-15 ENCOUNTER — Encounter: Payer: Self-pay | Admitting: Podiatry

## 2023-06-15 DIAGNOSIS — B351 Tinea unguium: Secondary | ICD-10-CM

## 2023-06-15 DIAGNOSIS — L84 Corns and callosities: Secondary | ICD-10-CM

## 2023-06-15 DIAGNOSIS — M79674 Pain in right toe(s): Secondary | ICD-10-CM

## 2023-06-15 DIAGNOSIS — E1142 Type 2 diabetes mellitus with diabetic polyneuropathy: Secondary | ICD-10-CM | POA: Diagnosis not present

## 2023-06-15 DIAGNOSIS — M14671 Charcot's joint, right ankle and foot: Secondary | ICD-10-CM

## 2023-06-15 DIAGNOSIS — M79675 Pain in left toe(s): Secondary | ICD-10-CM

## 2023-06-15 NOTE — Progress Notes (Signed)
 This patient returns to my office for at risk foot care.  This patient requires this care by a professional since this patient will be at risk due to having  Diabetes.   This patient is unable to cut nails himself since the patient cannot reach his nails.These nails are painful walking and wearing shoes.   . This patient presents for at risk foot care today.  Patient is now weaing cam walker right foot.  General Appearance  Alert, conversant and in no acute stress.  Vascular  Dorsalis pedis and posterior tibial  pulses are palpable  bilaterally.  Capillary return is within normal limits  bilaterally. Temperature is within normal limits  bilaterally.  Neurologic  Senn-Weinstein monofilament wire test within normal limits  bilaterally. Muscle power within normal limits bilaterally.  Nails Thick disfigured discolored nails with subungual debris  from hallux to fifth toes bilaterally. No evidence of bacterial infection or drainage bilaterally.  Orthopedic  No limitations of motion  feet .  No crepitus or effusions noted.  No bony pathology or digital deformities noted.  PTTD and foot arthritis right foot.  Skin  normotropic skin with no porokeratosis noted bilaterally.  No signs of infections or ulcers noted.     Callus medial aspect right foot due to Charcot foot.  Onychomycosis  Pain in right toes  Pain in left toes  Callus right foot.  Consent was obtained for treatment procedures.   Mechanical debridement of nails 1-5  bilaterally performed with a nail nipper.  Filed with dremel without incident.  Debride callus   with dremel tool.    Return office visit   2  months                   Told patient to return for periodic foot care and evaluation due to potential at risk complications.   Helane Gunther DPM

## 2023-06-20 DIAGNOSIS — Z1331 Encounter for screening for depression: Secondary | ICD-10-CM | POA: Diagnosis not present

## 2023-06-20 DIAGNOSIS — F321 Major depressive disorder, single episode, moderate: Secondary | ICD-10-CM | POA: Diagnosis not present

## 2023-06-20 DIAGNOSIS — I10 Essential (primary) hypertension: Secondary | ICD-10-CM | POA: Diagnosis not present

## 2023-06-20 DIAGNOSIS — Z1339 Encounter for screening examination for other mental health and behavioral disorders: Secondary | ICD-10-CM | POA: Diagnosis not present

## 2023-06-20 DIAGNOSIS — Z6836 Body mass index (BMI) 36.0-36.9, adult: Secondary | ICD-10-CM | POA: Diagnosis not present

## 2023-06-20 DIAGNOSIS — N1831 Chronic kidney disease, stage 3a: Secondary | ICD-10-CM | POA: Diagnosis not present

## 2023-06-20 DIAGNOSIS — Z0000 Encounter for general adult medical examination without abnormal findings: Secondary | ICD-10-CM | POA: Diagnosis not present

## 2023-06-20 DIAGNOSIS — Z87891 Personal history of nicotine dependence: Secondary | ICD-10-CM | POA: Diagnosis not present

## 2023-06-20 DIAGNOSIS — E11610 Type 2 diabetes mellitus with diabetic neuropathic arthropathy: Secondary | ICD-10-CM | POA: Diagnosis not present

## 2023-06-20 DIAGNOSIS — Z299 Encounter for prophylactic measures, unspecified: Secondary | ICD-10-CM | POA: Diagnosis not present

## 2023-06-20 DIAGNOSIS — Z7189 Other specified counseling: Secondary | ICD-10-CM | POA: Diagnosis not present

## 2023-07-12 DIAGNOSIS — I10 Essential (primary) hypertension: Secondary | ICD-10-CM | POA: Diagnosis not present

## 2023-07-12 DIAGNOSIS — E8940 Asymptomatic postprocedural ovarian failure: Secondary | ICD-10-CM | POA: Diagnosis not present

## 2023-07-12 DIAGNOSIS — E1165 Type 2 diabetes mellitus with hyperglycemia: Secondary | ICD-10-CM | POA: Diagnosis not present

## 2023-07-12 DIAGNOSIS — M25512 Pain in left shoulder: Secondary | ICD-10-CM | POA: Diagnosis not present

## 2023-07-12 DIAGNOSIS — Z299 Encounter for prophylactic measures, unspecified: Secondary | ICD-10-CM | POA: Diagnosis not present

## 2023-07-19 DIAGNOSIS — E8940 Asymptomatic postprocedural ovarian failure: Secondary | ICD-10-CM | POA: Diagnosis not present

## 2023-08-17 DIAGNOSIS — Z299 Encounter for prophylactic measures, unspecified: Secondary | ICD-10-CM | POA: Diagnosis not present

## 2023-08-17 DIAGNOSIS — M25551 Pain in right hip: Secondary | ICD-10-CM | POA: Diagnosis not present

## 2023-08-17 DIAGNOSIS — I10 Essential (primary) hypertension: Secondary | ICD-10-CM | POA: Diagnosis not present

## 2023-08-23 ENCOUNTER — Encounter: Payer: Self-pay | Admitting: Podiatry

## 2023-08-23 ENCOUNTER — Ambulatory Visit: Payer: BLUE CROSS/BLUE SHIELD | Admitting: Podiatry

## 2023-08-23 DIAGNOSIS — M79675 Pain in left toe(s): Secondary | ICD-10-CM | POA: Diagnosis not present

## 2023-08-23 DIAGNOSIS — L84 Corns and callosities: Secondary | ICD-10-CM | POA: Diagnosis not present

## 2023-08-23 DIAGNOSIS — B351 Tinea unguium: Secondary | ICD-10-CM | POA: Diagnosis not present

## 2023-08-23 DIAGNOSIS — M79674 Pain in right toe(s): Secondary | ICD-10-CM

## 2023-08-23 DIAGNOSIS — E1142 Type 2 diabetes mellitus with diabetic polyneuropathy: Secondary | ICD-10-CM

## 2023-08-23 DIAGNOSIS — M14671 Charcot's joint, right ankle and foot: Secondary | ICD-10-CM

## 2023-08-23 NOTE — Progress Notes (Signed)
This patient returns to my office for at risk foot care.  This patient requires this care by a professional since this patient will be at risk due to having  Diabetes.   This patient is unable to cut nails himself since the patient cannot reach his nails.These nails are painful walking and wearing shoes.   . This patient presents for at risk foot care today.  Patient is now weaing cam walker right foot.  General Appearance  Alert, conversant and in no acute stress.  Vascular  Dorsalis pedis and posterior tibial  pulses are palpable  bilaterally.  Capillary return is within normal limits  bilaterally. Temperature is within normal limits  bilaterally.  Neurologic  Senn-Weinstein monofilament wire test within normal limits  bilaterally. Muscle power within normal limits bilaterally.  Nails Thick disfigured discolored nails with subungual debris  from hallux to fifth toes bilaterally. No evidence of bacterial infection or drainage bilaterally.  Orthopedic  No limitations of motion  feet .  No crepitus or effusions noted.  No bony pathology or digital deformities noted.  PTTD and foot arthritis right foot.  Skin  normotropic skin with no porokeratosis noted bilaterally.  No signs of infections or ulcers noted.     Callus medial aspect right foot due to Charcot foot.  Onychomycosis  Pain in right toes  Pain in left toes  Callus right foot.  Consent was obtained for treatment procedures.   Mechanical debridement of nails 1-5  bilaterally performed with a nail nipper.  Filed with dremel without incident.  Debride callus with dremel tool.   Return office visit   3 months                   Told patient to return for periodic foot care and evaluation due to potential at risk complications.   Helane Gunther DPM

## 2023-10-13 DIAGNOSIS — Z299 Encounter for prophylactic measures, unspecified: Secondary | ICD-10-CM | POA: Diagnosis not present

## 2023-10-13 DIAGNOSIS — M1990 Unspecified osteoarthritis, unspecified site: Secondary | ICD-10-CM | POA: Diagnosis not present

## 2023-10-13 DIAGNOSIS — I10 Essential (primary) hypertension: Secondary | ICD-10-CM | POA: Diagnosis not present

## 2023-10-13 DIAGNOSIS — E1165 Type 2 diabetes mellitus with hyperglycemia: Secondary | ICD-10-CM | POA: Diagnosis not present

## 2023-10-26 DIAGNOSIS — Z299 Encounter for prophylactic measures, unspecified: Secondary | ICD-10-CM | POA: Diagnosis not present

## 2023-10-26 DIAGNOSIS — M79672 Pain in left foot: Secondary | ICD-10-CM | POA: Diagnosis not present

## 2023-10-26 DIAGNOSIS — R5383 Other fatigue: Secondary | ICD-10-CM | POA: Diagnosis not present

## 2023-10-26 DIAGNOSIS — E7800 Pure hypercholesterolemia, unspecified: Secondary | ICD-10-CM | POA: Diagnosis not present

## 2023-10-26 DIAGNOSIS — Z79899 Other long term (current) drug therapy: Secondary | ICD-10-CM | POA: Diagnosis not present

## 2023-10-26 DIAGNOSIS — I10 Essential (primary) hypertension: Secondary | ICD-10-CM | POA: Diagnosis not present

## 2023-10-26 DIAGNOSIS — F321 Major depressive disorder, single episode, moderate: Secondary | ICD-10-CM | POA: Diagnosis not present

## 2023-10-26 DIAGNOSIS — Z0000 Encounter for general adult medical examination without abnormal findings: Secondary | ICD-10-CM | POA: Diagnosis not present

## 2023-11-15 DIAGNOSIS — D729 Disorder of white blood cells, unspecified: Secondary | ICD-10-CM | POA: Diagnosis not present

## 2023-11-23 ENCOUNTER — Ambulatory Visit: Admitting: Podiatry

## 2023-11-28 ENCOUNTER — Encounter: Payer: Self-pay | Admitting: Podiatry

## 2023-11-28 ENCOUNTER — Ambulatory Visit: Admitting: Podiatry

## 2023-11-28 DIAGNOSIS — E1142 Type 2 diabetes mellitus with diabetic polyneuropathy: Secondary | ICD-10-CM | POA: Diagnosis not present

## 2023-11-28 DIAGNOSIS — B351 Tinea unguium: Secondary | ICD-10-CM | POA: Diagnosis not present

## 2023-11-28 DIAGNOSIS — M79674 Pain in right toe(s): Secondary | ICD-10-CM | POA: Diagnosis not present

## 2023-11-28 DIAGNOSIS — M79675 Pain in left toe(s): Secondary | ICD-10-CM

## 2023-11-28 DIAGNOSIS — L84 Corns and callosities: Secondary | ICD-10-CM | POA: Diagnosis not present

## 2023-11-28 NOTE — Progress Notes (Signed)
 This patient returns to my office for at risk foot care.  This patient requires this care by a professional since this patient will be at risk due to having  Diabetes.   This patient is unable to cut nails himself since the patient cannot reach his nails.These nails are painful walking and wearing shoes.   . This patient presents for at risk foot care today.  Patient is now weaing cam walker right foot.  General Appearance  Alert, conversant and in no acute stress.  Vascular  Dorsalis pedis and posterior tibial  pulses are palpable  bilaterally.  Capillary return is within normal limits  bilaterally. Temperature is within normal limits  bilaterally.  Neurologic  Senn-Weinstein monofilament wire test within normal limits  bilaterally. Muscle power within normal limits bilaterally.  Nails Thick disfigured discolored nails with subungual debris  from hallux to fifth toes bilaterally. No evidence of bacterial infection or drainage bilaterally.  Orthopedic  No limitations of motion  feet .  No crepitus or effusions noted.  No bony pathology or digital deformities noted.  PTTD and foot arthritis right foot.  Skin  normotropic skin with no porokeratosis noted bilaterally.  No signs of infections or ulcers noted.     Callus medial aspect right foot due to Charcot foot.  Onychomycosis  Pain in right toes  Pain in left toes  Callus right foot.  Consent was obtained for treatment procedures.   Mechanical debridement of nails 1-5  bilaterally performed with a nail nipper.  Filed with dremel without incident.  Debride callus   with dremel tool.    Return office visit   3 months                   Told patient to return for periodic foot care and evaluation due to potential at risk complications.   Cordella Bold DPM

## 2023-12-02 DIAGNOSIS — Z299 Encounter for prophylactic measures, unspecified: Secondary | ICD-10-CM | POA: Diagnosis not present

## 2023-12-02 DIAGNOSIS — I10 Essential (primary) hypertension: Secondary | ICD-10-CM | POA: Diagnosis not present

## 2023-12-02 DIAGNOSIS — M25561 Pain in right knee: Secondary | ICD-10-CM | POA: Diagnosis not present

## 2024-01-17 DIAGNOSIS — I10 Essential (primary) hypertension: Secondary | ICD-10-CM | POA: Diagnosis not present

## 2024-01-17 DIAGNOSIS — E119 Type 2 diabetes mellitus without complications: Secondary | ICD-10-CM | POA: Diagnosis not present

## 2024-01-17 DIAGNOSIS — Z23 Encounter for immunization: Secondary | ICD-10-CM | POA: Diagnosis not present

## 2024-01-17 DIAGNOSIS — Z299 Encounter for prophylactic measures, unspecified: Secondary | ICD-10-CM | POA: Diagnosis not present

## 2024-01-17 DIAGNOSIS — M25571 Pain in right ankle and joints of right foot: Secondary | ICD-10-CM | POA: Diagnosis not present

## 2024-02-21 DIAGNOSIS — M19012 Primary osteoarthritis, left shoulder: Secondary | ICD-10-CM | POA: Diagnosis not present

## 2024-02-28 ENCOUNTER — Ambulatory Visit: Admitting: Podiatry

## 2024-03-22 ENCOUNTER — Ambulatory Visit: Admitting: Podiatry

## 2024-03-22 ENCOUNTER — Encounter: Payer: Self-pay | Admitting: Podiatry

## 2024-03-22 DIAGNOSIS — M79675 Pain in left toe(s): Secondary | ICD-10-CM | POA: Diagnosis not present

## 2024-03-22 DIAGNOSIS — B351 Tinea unguium: Secondary | ICD-10-CM | POA: Diagnosis not present

## 2024-03-22 DIAGNOSIS — M79674 Pain in right toe(s): Secondary | ICD-10-CM

## 2024-03-22 DIAGNOSIS — L84 Corns and callosities: Secondary | ICD-10-CM

## 2024-03-22 DIAGNOSIS — E1142 Type 2 diabetes mellitus with diabetic polyneuropathy: Secondary | ICD-10-CM | POA: Diagnosis not present

## 2024-03-22 NOTE — Progress Notes (Signed)
 This patient returns to my office for at risk foot care.  This patient requires this care by a professional since this patient will be at risk due to having  Diabetes.   This patient is unable to cut nails himself since the patient cannot reach his nails.These nails are painful walking and wearing shoes.   . This patient presents for at risk foot care today.  Patient is now weaing cam walker right foot.  General Appearance  Alert, conversant and in no acute stress.  Vascular  Dorsalis pedis and posterior tibial  pulses are palpable  bilaterally.  Capillary return is within normal limits  bilaterally. Temperature is within normal limits  bilaterally.  Neurologic  Senn-Weinstein monofilament wire test within normal limits  bilaterally. Muscle power within normal limits bilaterally.  Nails Thick disfigured discolored nails with subungual debris  from hallux to fifth toes bilaterally. No evidence of bacterial infection or drainage bilaterally.  Orthopedic  No limitations of motion  feet .  No crepitus or effusions noted.  No bony pathology or digital deformities noted.  PTTD and foot arthritis right foot.  Skin  normotropic skin with no porokeratosis noted bilaterally.  No signs of infections or ulcers noted.     Callus medial aspect right foot due to Charcot foot.  Onychomycosis  Pain in right toes  Pain in left toes  Callus right foot.  Consent was obtained for treatment procedures.   Mechanical debridement of nails 1-5  bilaterally performed with a nail nipper.  Filed with dremel without incident.  Debride callus   with dremel tool.    Return office visit   3 months                   Told patient to return for periodic foot care and evaluation due to potential at risk complications.   Cordella Bold DPM

## 2024-04-27 ENCOUNTER — Ambulatory Visit

## 2024-04-27 ENCOUNTER — Encounter: Payer: Self-pay | Admitting: Emergency Medicine

## 2024-04-27 ENCOUNTER — Ambulatory Visit
Admission: EM | Admit: 2024-04-27 | Discharge: 2024-04-27 | Disposition: A | Discharge status: Home / Self Care | Attending: Physician Assistant | Admitting: Physician Assistant

## 2024-04-27 DIAGNOSIS — L03114 Cellulitis of left upper limb: Secondary | ICD-10-CM | POA: Diagnosis not present

## 2024-04-27 DIAGNOSIS — M25522 Pain in left elbow: Secondary | ICD-10-CM | POA: Diagnosis not present

## 2024-04-27 MED ORDER — HYDROCODONE-ACETAMINOPHEN 5-325 MG PO TABS: 0.5000 | ORAL_TABLET | Freq: Two times a day (BID) | ORAL | 0 refills | Status: DC | PRN

## 2024-04-27 MED ORDER — DOXYCYCLINE HYCLATE 100 MG PO CAPS: 100.0000 mg | ORAL_CAPSULE | Freq: Two times a day (BID) | ORAL | 0 refills | Status: DC

## 2024-04-27 NOTE — ED Triage Notes (Signed)
 [place] on Christmas day.  Has knot on left elbow.  Yesterday her dog jumped on left hand and hand is swollen and red around wrist area.  States has taken pain pills and nothing is getting rid of pain.

## 2024-04-27 NOTE — ED Provider Notes (Addendum)
 " [facility]    CSN: [id number] Arrival date & time: 04/27/24  1345      History   Chief Complaint No chief complaint on file.   HPI Roza Creamer is a 74 y.o. female.   Patient presents today with pain in her left elbow and hand.  She reports that the pain in her elbow started when she fell on 04/15/2024.  She was using her rollator to help get groceries into the house when she forgot to lock it and it rolled forward causing her to fall.  She has had ongoing pain in her elbow since that time and has noted a bony nodule that has gradually been getting bigger.  The pain is rated 10 on a 0-10 pain scale, described as throbbing, worse with movement, no alleviating factors identified.  She reports that over the past several days she has also developed redness and swelling of her left hand after her dog jumped up and caused a small scratch.  She is left-handed.  Denies any numbness or paresthesias.  She denies history of rheumatoid arthritis but does report history of gout.  Reports that current symptoms are not similar to previous episodes of gout.  She is diabetic and reports that her blood sugars have been running high but is unable to quantify this.  She denies any recent antibiotics in the past 90 days.  She has been taking over-the-counter medications as well as prescribed tramadol  and hydrocodone  without improvement of symptoms.  She denies any fever, nausea, vomiting.    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   Acute respiratory failure with hypoxia (HCC) 09/17/2020   Gout attack 11/08/2018   Edema 10/24/2018   Ankle pain 06/06/2017   Upper airway cough syndrome vs cough variant asthma 12/16/2015   Abnormal WBC count 02/26/2015   Arthritis 01/22/2015   History of gout 01/22/2015   Mild intermittent asthma without complication 01/22/2015    Past Surgical History:  Procedure Laterality Date   BIOPSY  02/13/2021   Procedure: BIOPSY;  Surgeon: Eartha Angelia Sieving, MD;  Location: [facility];  Service: Gastroenterology;;   ESOPHAGEAL DILATION N/A 02/13/2021   Procedure: ESOPHAGEAL DILATION;  Surgeon: Eartha Angelia Sieving, MD;  Location: [facility];  Service: Gastroenterology;  Laterality: N/A;   ESOPHAGOGASTRODUODENOSCOPY N/A 11/07/2020   Cirigliano: r/t food impaction in lower third of esophagus, food bolus advanced into stomach with air insufflation and gentle endoscopic pressure, small hh, bening appearing esphageal stenosis, normal stomach and duodenum, no specimens   ESOPHAGOGASTRODUODENOSCOPY (EGD) WITH PROPOFOL  N/A 02/13/2021   Procedure: ESOPHAGOGASTRODUODENOSCOPY (EGD) WITH PROPOFOL ;  Surgeon: Eartha Angelia Sieving, MD;  Location: [facility];  Service: Gastroenterology;  Laterality: N/A;   FOREIGN BODY REMOVAL  11/07/2020   Procedure: FOREIGN BODY REMOVAL;  Surgeon: San Sandor GAILS, DO;  Location: [facility];  Service: Gastroenterology;;   REVERSE SHOULDER ARTHROPLASTY Right 09/17/2020   Procedure: REVERSE SHOULDER ARTHROPLASTY;  Surgeon: Cristy,  Bonner DASEN, MD;  Location: [facility];  Service: Orthopedics;  Laterality: Right;    OB History   No obstetric history on file.      Home Medications    Prior to Admission medications  Medication Sig Start Date End Date Taking? Authorizing Provider  doxycycline  (VIBRAMYCIN ) 100 MG capsule Take 1 capsule (100 mg total) by mouth 2 (two) times daily. 04/27/24  Yes  Kourtney Terriquez K, PA-C  HYDROcodone -acetaminophen  (NORCO/VICODIN) 5-325 MG tablet Take 0.5-1 tablets by mouth 2 (two) times daily as needed for up to 3 days. 04/27/24 04/30/24 Yes Kerrion Kemppainen K, PA-C  albuterol  (VENTOLIN  HFA) 108 (90 Base) MCG/ACT inhaler Inhale 2 puffs into the lungs every 6 (six) hours as needed for wheezing or shortness of breath.    [provider]  ASPERCREME LIDOCAINE  4 % CREA Apply 1 application topically 4 (four) times daily as needed (for pain).    [provider]  Budeson-Glycopyrrol-Formoterol  (BREZTRI  AEROSPHERE) 160-9-4.8 MCG/ACT AERO Inhale 2 puffs into the lungs in the morning and at bedtime. 01/14/23   [name], MD  budesonide -formoterol  (SYMBICORT ) 80-4.5 MCG/ACT inhaler Take 2 puffs first thing in am and then another 2 puffs about 12 hours later. 01/14/23   [name], MD  CONTOUR NEXT TEST test strip 4 (four) times daily. for testing 03/13/18   [provider]  famotidine  (PEPCID ) 20 MG tablet One after supper 01/14/23   Wert, Michael B, MD  fluticasone  (FLONASE ) 50 MCG/ACT nasal spray Place 1 spray into both nostrils 2 (two) times daily. 11/14/22   Stuart Vernell Norris, PA-C  furosemide  (LASIX ) 40 MG tablet Take 40 mg by mouth 2 (two) times daily. 04/26/18   [provider]  GLOBAL EASE INJECT PEN NEEDLES 31G X 5 MM MISC 4 (four) times daily. 04/17/18   [provider]  insulin  aspart (FIASP  FLEXTOUCH) 100 UNIT/ML FlexTouch Pen Inject into the skin.    [provider]  JARDIANCE 25 MG TABS tablet Take 12.5 mg by mouth in the morning.    [provider]  Magnesium 500 MG CAPS Take by mouth.    [provider]  mometasone -formoterol  (DULERA) 100-5 MCG/ACT AERO Take 2 puffs first thing in am and then another 2 puffs about 12 hours later. 01/14/23   [name], MD  OVER THE COUNTER MEDICATION Magnessium 500mg  daily  Vit d one daily  Potassium 99mg  one daily    [provider]   pantoprazole  (PROTONIX ) 40 MG tablet Take 1 tablet (40 mg total) by mouth daily. Take 30-60 min before first meal of the day 01/14/23   [name], MD  Potassium 99 MG TABS Take by mouth.    [provider]  TRESIBA FLEXTOUCH 200 UNIT/ML SOPN Inject 52 Units into the skin at bedtime. 03/13/18   [provider]  VITAMIN D PO Take by mouth.    [provider]  Insulin  Detemir (LEVEMIR FLEXTOUCH) 100 UNIT/ML Pen Levemir FlexTouch U-100 Insulin  100 unit/mL (3 mL) subcutaneous pen  05/03/18  [provider]    Family History Family History  Problem Relation Age of Onset   Heart disease Father     Social History Social History[1]   Allergies   Lisinopril and Metformin   Review of Systems Review of Systems  Constitutional:  Positive for activity change. Negative for appetite change, fatigue and fever.  Gastrointestinal:  Negative for diarrhea, nausea and vomiting.  Musculoskeletal:  Positive for arthralgias. Negative for  myalgias.  Skin:  Positive for color change and wound.  Neurological:  Negative for weakness and numbness.     Physical Exam Triage Vital Signs ED Triage Vitals  Encounter Vitals Group     BP 04/27/24 1517 117/77     Girls Systolic BP Percentile --      Girls Diastolic BP Percentile --      Boys Systolic BP Percentile --      Boys Diastolic BP Percentile --      Pulse Rate 04/27/24 1517 87     Resp 04/27/24 1517 18     Temp 04/27/24 1517 98.2 F (36.8 C)     Temp Source 04/27/24 1517 Oral     SpO2 04/27/24 1517 96 %     Weight --      Height --      Head Circumference --      Peak Flow --      Pain Score 04/27/24 1519 8     Pain Loc --      Pain Education --      Exclude from Growth Chart --    No data found.  Updated Vital Signs BP 117/77 (BP Location: Right Arm)   Pulse 87   Temp 98.2 F (36.8 C) (Oral)   Resp 18   SpO2 96%   Visual Acuity Right Eye Distance:   Left Eye Distance:   Bilateral  Distance:    Right Eye Near:   Left Eye Near:    Bilateral Near:     Physical Exam Vitals reviewed.  Constitutional:      General: She is awake. She is not in acute distress.    Appearance: Normal appearance. She is well-developed. She is not ill-appearing.     Comments: Very pleasant female appears stated age in no acute distress sitting comfortably in exam room  HENT:     Head: Normocephalic and atraumatic.  Cardiovascular:     Rate and Rhythm: Normal rate and regular rhythm.     Pulses:          Radial pulses are 2+ on the left side.     Heart sounds: Normal heart sounds, S1 normal and S2 normal. No murmur heard.    Comments: Capillary refill within 2 seconds left fingers Pulmonary:     Effort: Pulmonary effort is normal.     Breath sounds: Normal breath sounds. No wheezing, rhonchi or rales.     Comments: Clear to auscultation bilaterally Abdominal:     Palpations: Abdomen is soft.     Tenderness: There is no abdominal tenderness.  Musculoskeletal:     Left elbow: Swelling and deformity present. Normal range of motion. Tenderness present in olecranon process.     Left hand: Swelling and tenderness present. Normal range of motion. There is no disruption of two-point discrimination. Normal capillary refill.     Comments: Left elbow: Bony nodule along ulnar ulna measuring approximately 2 cm x 1 cm that is tender to palpation.  No additional deformity noted.  Normal active range of motion at elbow.  Left hand: Area of erythema, warmth to touch, tenderness on dorsal hand measuring approximately 3 cm x 2 cm.  No streaking or evidence of lymphangitis.  Healing wound measuring approximately 1 cm located distal to the area of erythema.  Hand is neurovascularly intact.  No focal tenderness.  Psychiatric:        Behavior: Behavior is cooperative.      UC Treatments / Results  Labs (all labs ordered are listed, but only abnormal results are displayed) Labs Reviewed - No data to  display  EKG   Radiology DG Wrist Complete Left Result Date: 04/27/2024 CLINICAL DATA:  Left wrist pain after fall EXAM: LEFT WRIST - COMPLETE 3+ VIEW COMPARISON:  None Available. FINDINGS: There is no evidence of fracture or dislocation. There is no evidence of arthropathy or other focal bone abnormality. Soft tissues are unremarkable. IMPRESSION: Negative. Electronically Signed   By: Lynwood Landy Raddle M.D.   On: 04/27/2024 16:01   DG Elbow Complete Left Result Date: 04/27/2024 CLINICAL DATA:  Left elbow pain after fall EXAM: LEFT ELBOW - COMPLETE 3+ VIEW COMPARISON:  None Available. FINDINGS: There is no evidence of fracture, dislocation, or joint effusion. There is no evidence of arthropathy or other focal bone abnormality. Soft tissues are unremarkable. IMPRESSION: Negative. Electronically Signed   By: Lynwood Landy Raddle M.D.   On: 04/27/2024 15:58    Procedures Procedures (including critical care time)  Medications Ordered in UC Medications - No data to display  Initial Impression / Assessment and Plan / UC Course  I have reviewed the triage vital signs and the nursing notes.  Pertinent labs & imaging results that were available during my care of the patient were reviewed by me and considered in my medical decision making (see chart for details).     Patient is well-appearing, afebrile, nontoxic, nontachycardic.  Her hand is neurovascularly intact.  X-ray of wrist and elbow was obtained given recent fall with bony tenderness that showed no acute osseous abnormality.  I am concerned for the beginning of cellulitis given she has developed redness, pain, warmth to touch on her dorsal hand after being scratched in this area by her dog.  We discussed symptoms could also be gout, ever, I am hesitant to start prednisone due to her history of uncontrolled diabetes and she can use over-the-counter NSAIDs as previously recommended by her primary care as we do not have recent kidney function to  appropriately adjust these medications.  Will start doxycycline  100 mg twice daily for 10 days.  Discussed that she should monitor the area of erythema and if this spreads or changes despite the antibiotic she needs to be reevaluated.  I am unclear what caused the enlarging nodule on her left elbow but encouraged her to use warm compress on the area.  There does not appear to be evidence of infection.  Recommend she follow-up with orthopedics; she has an appointment scheduled for Monday (04/30/2024) and was encouraged to keep that appointment.  She has been unable to manage her pain with over-the-counter medication or previously prescribed tramadol .  I did agree to give her a few doses of hydrocodone .  Review of [place]  controlled substance database shows no inappropriate refills.  Discussed that we are unable to find refills of this medication and that she should limit use is much as possible as it is both addictive and sedating.  We discussed that if this is unable to control her pain or if anything changes and she has red streaks up her arm, enlarging redness, fever, numbness or tingling in the hand, weakness that she needs to go to the emergency room.  Strict return precautions given.    Final Clinical Impressions(s) / UC Diagnoses   Final diagnoses:  Cellulitis of left hand  Left elbow pain     Discharge Instructions      Your x-rays did not show any broken or out of  placement in your right wrist or elbow.  I am concerned that you are developing an infection in your hand.  Start doxycycline  100 mg twice daily for 10 days.  Draw a line around the area of redness and if this spreads after being on the antibiotics or if anything worsens you need to be seen immediately.  I have called in a few doses of hydrocodone .  We cannot provide any refills of this medicine.  It makes you sleepy and can be addictive so try to limit use is much as possible.  This replaces your tramadol  and should not be used  together.  Follow-up with orthopedics on Monday as scheduled.  If anything worsens go to the emergency room as we discussed.     ED Prescriptions     Medication Sig Dispense Auth. Provider   doxycycline  (VIBRAMYCIN ) 100 MG capsule Take 1 capsule (100 mg total) by mouth 2 (two) times daily. 20 capsule Krystie Leiter K, PA-C   HYDROcodone -acetaminophen  (NORCO/VICODIN) 5-325 MG tablet Take 0.5-1 tablets by mouth 2 (two) times daily as needed for up to 3 days. 6 tablet Schelly Chuba K, PA-C      I have reviewed the PDMP during this encounter.    Sherrell Rocky POUR, PA-C 04/27/24 1651     [1]  Social History Tobacco Use   Smoking status: Former    Current packs/day: 0.00    Average packs/day: 1 pack/day for 2.0 years (2.0 ttl pk-yrs)    Types: Cigarettes    Start date: 04/19/1973    Quit date: 04/20/1975    Years since quitting: 49.0   Smokeless tobacco: Never  Vaping Use   Vaping status: Never Used  Substance Use Topics   Alcohol  use: Never   Drug use: Never     Sherrell Rocky POUR, PA-C 04/27/24 1652  "

## 2024-04-27 NOTE — Discharge Instructions (Signed)
 Your x-rays did not show any broken or out of placement in your right wrist or elbow.  I am concerned that you are developing an infection in your hand.  Start doxycycline  100 mg twice daily for 10 days.  Draw a line around the area of redness and if this spreads after being on the antibiotics or if anything worsens you need to be seen immediately.  I have called in a few doses of hydrocodone .  We cannot provide any refills of this medicine.  It makes you sleepy and can be addictive so try to limit use is much as possible.  This replaces your tramadol  and should not be used together.  Follow-up with orthopedics on Monday as scheduled.  If anything worsens go to the emergency room as we discussed.

## 2024-04-30 ENCOUNTER — Inpatient Hospital Stay (HOSPITAL_COMMUNITY)
Admission: EM | Admit: 2024-04-30 | Discharge: 2024-05-07 | DRG: 638 | Disposition: A | Discharge status: Home Health | Attending: Family Medicine | Admitting: Family Medicine

## 2024-04-30 ENCOUNTER — Encounter (HOSPITAL_COMMUNITY): Payer: Self-pay

## 2024-04-30 ENCOUNTER — Other Ambulatory Visit: Payer: Self-pay

## 2024-04-30 ENCOUNTER — Emergency Department (HOSPITAL_COMMUNITY)

## 2024-04-30 DIAGNOSIS — Z794 Long term (current) use of insulin: Secondary | ICD-10-CM | POA: Diagnosis not present

## 2024-04-30 DIAGNOSIS — D72829 Elevated white blood cell count, unspecified: Secondary | ICD-10-CM | POA: Diagnosis not present

## 2024-04-30 DIAGNOSIS — N1831 Chronic kidney disease, stage 3a: Secondary | ICD-10-CM | POA: Diagnosis present

## 2024-04-30 DIAGNOSIS — Z888 Allergy status to other drugs, medicaments and biological substances status: Secondary | ICD-10-CM

## 2024-04-30 DIAGNOSIS — E876 Hypokalemia: Secondary | ICD-10-CM | POA: Diagnosis not present

## 2024-04-30 DIAGNOSIS — E785 Hyperlipidemia, unspecified: Secondary | ICD-10-CM | POA: Diagnosis present

## 2024-04-30 DIAGNOSIS — L03114 Cellulitis of left upper limb: Secondary | ICD-10-CM | POA: Diagnosis not present

## 2024-04-30 DIAGNOSIS — E119 Type 2 diabetes mellitus without complications: Secondary | ICD-10-CM | POA: Diagnosis not present

## 2024-04-30 DIAGNOSIS — Z7984 Long term (current) use of oral hypoglycemic drugs: Secondary | ICD-10-CM

## 2024-04-30 DIAGNOSIS — Z7951 Long term (current) use of inhaled steroids: Secondary | ICD-10-CM

## 2024-04-30 DIAGNOSIS — E1165 Type 2 diabetes mellitus with hyperglycemia: Secondary | ICD-10-CM | POA: Diagnosis present

## 2024-04-30 DIAGNOSIS — W540XXA Bitten by dog, initial encounter: Secondary | ICD-10-CM

## 2024-04-30 DIAGNOSIS — M109 Gout, unspecified: Secondary | ICD-10-CM | POA: Diagnosis present

## 2024-04-30 DIAGNOSIS — E66812 Obesity, class 2: Secondary | ICD-10-CM | POA: Diagnosis present

## 2024-04-30 DIAGNOSIS — Z87891 Personal history of nicotine dependence: Secondary | ICD-10-CM

## 2024-04-30 DIAGNOSIS — M170 Bilateral primary osteoarthritis of knee: Secondary | ICD-10-CM | POA: Diagnosis present

## 2024-04-30 DIAGNOSIS — Z79899 Other long term (current) drug therapy: Secondary | ICD-10-CM

## 2024-04-30 DIAGNOSIS — Z96611 Presence of right artificial shoulder joint: Secondary | ICD-10-CM | POA: Diagnosis present

## 2024-04-30 DIAGNOSIS — E11628 Type 2 diabetes mellitus with other skin complications: Principal | ICD-10-CM | POA: Diagnosis present

## 2024-04-30 DIAGNOSIS — I129 Hypertensive chronic kidney disease with stage 1 through stage 4 chronic kidney disease, or unspecified chronic kidney disease: Secondary | ICD-10-CM | POA: Diagnosis present

## 2024-04-30 DIAGNOSIS — L03012 Cellulitis of left finger: Secondary | ICD-10-CM | POA: Diagnosis present

## 2024-04-30 DIAGNOSIS — Z6836 Body mass index (BMI) 36.0-36.9, adult: Secondary | ICD-10-CM

## 2024-04-30 DIAGNOSIS — K219 Gastro-esophageal reflux disease without esophagitis: Secondary | ICD-10-CM | POA: Diagnosis present

## 2024-04-30 DIAGNOSIS — T148XXA Other injury of unspecified body region, initial encounter: Secondary | ICD-10-CM

## 2024-04-30 DIAGNOSIS — E1122 Type 2 diabetes mellitus with diabetic chronic kidney disease: Secondary | ICD-10-CM | POA: Diagnosis present

## 2024-04-30 DIAGNOSIS — Z8249 Family history of ischemic heart disease and other diseases of the circulatory system: Secondary | ICD-10-CM

## 2024-04-30 LAB — COMPREHENSIVE METABOLIC PANEL WITH GFR
ALT: 20 U/L (ref 0–44)
AST: 26 U/L (ref 15–41)
Albumin: 4.1 g/dL (ref 3.5–5.0)
Alkaline Phosphatase: 230 U/L — ABNORMAL HIGH (ref 38–126)
Anion gap: 18 — ABNORMAL HIGH (ref 5–15)
BUN: 40 mg/dL — ABNORMAL HIGH (ref 8–23)
CO2: 23 mmol/L (ref 22–32)
Calcium: 10.2 mg/dL (ref 8.9–10.3)
Chloride: 97 mmol/L — ABNORMAL LOW (ref 98–111)
Creatinine, Ser: 1.51 mg/dL — ABNORMAL HIGH (ref 0.44–1.00)
GFR, Estimated: 37 mL/min — ABNORMAL LOW
Glucose, Bld: 156 mg/dL — ABNORMAL HIGH (ref 70–99)
Potassium: 3 mmol/L — ABNORMAL LOW (ref 3.5–5.1)
Sodium: 137 mmol/L (ref 135–145)
Total Bilirubin: 0.4 mg/dL (ref 0.0–1.2)
Total Protein: 8.9 g/dL — ABNORMAL HIGH (ref 6.5–8.1)

## 2024-04-30 LAB — PROTIME-INR
INR: 1 (ref 0.8–1.2)
Prothrombin Time: 13.8 s (ref 11.4–15.2)

## 2024-04-30 LAB — CBC WITH DIFFERENTIAL/PLATELET
Abs Immature Granulocytes: 0.11 K/uL — ABNORMAL HIGH (ref 0.00–0.07)
Basophils Absolute: 0.1 K/uL (ref 0.0–0.1)
Basophils Relative: 1 %
Eosinophils Absolute: 0.1 K/uL (ref 0.0–0.5)
Eosinophils Relative: 1 %
HCT: 41.5 % (ref 36.0–46.0)
Hemoglobin: 12.8 g/dL (ref 12.0–15.0)
Immature Granulocytes: 1 %
Lymphocytes Relative: 18 %
Lymphs Abs: 3 K/uL (ref 0.7–4.0)
MCH: 28 pg (ref 26.0–34.0)
MCHC: 30.8 g/dL (ref 30.0–36.0)
MCV: 90.8 fL (ref 80.0–100.0)
Monocytes Absolute: 1.3 K/uL — ABNORMAL HIGH (ref 0.1–1.0)
Monocytes Relative: 8 %
Neutro Abs: 11.8 K/uL — ABNORMAL HIGH (ref 1.7–7.7)
Neutrophils Relative %: 71 %
Platelets: 503 K/uL — ABNORMAL HIGH (ref 150–400)
RBC: 4.57 MIL/uL (ref 3.87–5.11)
RDW: 14.6 % (ref 11.5–15.5)
WBC: 16.4 K/uL — ABNORMAL HIGH (ref 4.0–10.5)
nRBC: 0 % (ref 0.0–0.2)

## 2024-04-30 LAB — MAGNESIUM: Magnesium: 1.9 mg/dL (ref 1.7–2.4)

## 2024-04-30 LAB — I-STAT CG4 LACTIC ACID, ED: Lactic Acid, Venous: 1.4 mmol/L (ref 0.5–1.9)

## 2024-04-30 MED ORDER — MORPHINE SULFATE (PF) 4 MG/ML IV SOLN
4.0000 mg | Freq: Once | INTRAVENOUS | Status: AC
  Administered 2024-04-30: 4 mg via INTRAVENOUS
  Filled 2024-04-30: qty 1

## 2024-04-30 MED ORDER — INSULIN ASPART 100 UNIT/ML IJ SOLN
0.0000 [IU] | Freq: Three times a day (TID) | INTRAMUSCULAR | Status: DC
  Administered 2024-05-01 – 2024-05-03 (×5): 1 [IU] via SUBCUTANEOUS
  Administered 2024-05-05: 3 [IU] via SUBCUTANEOUS
  Administered 2024-05-05: 1 [IU] via SUBCUTANEOUS
  Administered 2024-05-05: 2 [IU] via SUBCUTANEOUS
  Administered 2024-05-06 (×3): 3 [IU] via SUBCUTANEOUS
  Administered 2024-05-07: 1 [IU] via SUBCUTANEOUS
  Administered 2024-05-07: 3 [IU] via SUBCUTANEOUS
  Administered 2024-05-07: 2 [IU] via SUBCUTANEOUS
  Filled 2024-04-30 (×2): qty 4
  Filled 2024-04-30 (×2): qty 1
  Filled 2024-04-30 (×2): qty 3
  Filled 2024-04-30: qty 2
  Filled 2024-04-30: qty 1
  Filled 2024-04-30: qty 2
  Filled 2024-04-30: qty 1
  Filled 2024-04-30: qty 2
  Filled 2024-04-30 (×3): qty 1

## 2024-04-30 MED ORDER — POTASSIUM CHLORIDE CRYS ER 20 MEQ PO TBCR
40.0000 meq | EXTENDED_RELEASE_TABLET | Freq: Once | ORAL | Status: AC
  Administered 2024-05-01: 40 meq via ORAL
  Filled 2024-04-30: qty 2

## 2024-04-30 MED ORDER — HYDROMORPHONE HCL 1 MG/ML IJ SOLN
0.5000 mg | INTRAMUSCULAR | Status: DC | PRN
  Administered 2024-05-01 – 2024-05-07 (×22): 0.5 mg via INTRAVENOUS
  Filled 2024-04-30 (×8): qty 0.5
  Filled 2024-04-30 (×2): qty 1
  Filled 2024-04-30 (×3): qty 0.5
  Filled 2024-04-30: qty 1
  Filled 2024-04-30: qty 0.5
  Filled 2024-04-30: qty 1
  Filled 2024-04-30 (×6): qty 0.5

## 2024-04-30 MED ORDER — SODIUM CHLORIDE 0.9 % IV SOLN
3.0000 g | Freq: Three times a day (TID) | INTRAVENOUS | Status: DC
  Administered 2024-05-01 – 2024-05-04 (×11): 3 g via INTRAVENOUS
  Filled 2024-04-30 (×11): qty 8

## 2024-04-30 MED ORDER — ONDANSETRON HCL 4 MG/2ML IJ SOLN
4.0000 mg | Freq: Four times a day (QID) | INTRAMUSCULAR | Status: DC | PRN
  Administered 2024-05-01 – 2024-05-06 (×2): 4 mg via INTRAVENOUS
  Filled 2024-04-30 (×2): qty 2

## 2024-04-30 MED ORDER — NALOXONE HCL 0.4 MG/ML IJ SOLN: 0.4000 mg | INTRAMUSCULAR | Status: DC | PRN

## 2024-04-30 MED ORDER — ACETAMINOPHEN 650 MG RE SUPP: 650.0000 mg | Freq: Four times a day (QID) | RECTAL | Status: DC | PRN

## 2024-04-30 MED ORDER — VANCOMYCIN HCL IN DEXTROSE 1-5 GM/200ML-% IV SOLN: 1000.0000 mg | INTRAVENOUS | Status: DC

## 2024-04-30 MED ORDER — VANCOMYCIN HCL 1250 MG/250ML IV SOLN
1250.0000 mg | Freq: Once | INTRAVENOUS | Status: AC
  Administered 2024-05-01: 1250 mg via INTRAVENOUS
  Filled 2024-04-30: qty 250

## 2024-04-30 MED ORDER — HYDROMORPHONE HCL 1 MG/ML IJ SOLN
0.5000 mg | Freq: Once | INTRAMUSCULAR | Status: AC
  Administered 2024-04-30: 0.5 mg via INTRAVENOUS
  Filled 2024-04-30: qty 1

## 2024-04-30 MED ORDER — LACTATED RINGERS IV SOLN: INTRAVENOUS | Status: AC

## 2024-04-30 MED ORDER — INSULIN ASPART 100 UNIT/ML IJ SOLN
0.0000 [IU] | Freq: Every day | INTRAMUSCULAR | Status: DC
  Administered 2024-05-01 – 2024-05-05 (×2): 2 [IU] via SUBCUTANEOUS
  Filled 2024-04-30 (×2): qty 2

## 2024-04-30 MED ORDER — ACETAMINOPHEN 325 MG PO TABS
650.0000 mg | ORAL_TABLET | Freq: Four times a day (QID) | ORAL | Status: DC | PRN
  Administered 2024-05-02 – 2024-05-06 (×5): 650 mg via ORAL
  Filled 2024-04-30 (×5): qty 2

## 2024-04-30 MED ORDER — SODIUM CHLORIDE 0.9 % IV SOLN
3.0000 g | Freq: Once | INTRAVENOUS | Status: AC
  Administered 2024-04-30: 3 g via INTRAVENOUS
  Filled 2024-04-30: qty 8

## 2024-04-30 MED ORDER — MELATONIN 3 MG PO TABS
3.0000 mg | ORAL_TABLET | Freq: Every evening | ORAL | Status: DC | PRN
  Administered 2024-05-01 – 2024-05-06 (×4): 3 mg via ORAL
  Filled 2024-04-30 (×4): qty 1

## 2024-04-30 NOTE — ED Provider Triage Note (Signed)
 Emergency Medicine Provider Triage Evaluation Note  Sheila [name] , a 74 y.o. female  was evaluated in triage.  Pt complains of left hand infection. Dog bit at the end of December.  Was placed on doxycycline  but hand appears worse.  It is red and swollen.  No reported fevers.  Was seen at [facility] today and was encouraged to come into the ER for evaluation and likely IV antibiotics.  Please see consultation note in patient's chart history.  Review of Systems  Positive:  Negative:   Physical Exam  BP (!) 158/71 (BP Location: Right Arm)   Pulse 92   Temp 99 F (37.2 C) (Oral)   Resp 19   Ht 5' (1.524 m)   Wt 81.2 kg   SpO2 100%   BMI 34.96 kg/m  Gen:   Awake, no distress   Resp:  Normal effort  MSK:   Moves extremities without difficulty  Other:  Erythema and increase in warmth to the more radial aspect of the dorsum of the left hand.  Palpable radial pulse.  Compartments are soft.  Tender to palpation.  No drainage noted.  Medical Decision Making  Medically screening exam initiated at 4:42 PM.  Appropriate orders placed.  Mataya Kilduff was informed that the remainder of the evaluation will be completed by another provider, this initial triage assessment does not replace that evaluation, and the importance of remaining in the ED until their evaluation is complete.  Labs and XR rodered.    Bernis Ernst, [place] 04/30/24 1644

## 2024-04-30 NOTE — ED Notes (Signed)
 Attempted IV in R Hand, vein blew, blood cultures were collected

## 2024-04-30 NOTE — Progress Notes (Signed)
 Pharmacy Antibiotic Note  Sheila [name] is a 74 y.o. female admitted on 04/30/2024 with infected dog bit.  Pharmacy has been consulted for vancomycin  dosing.  Plan: Vancomycin  1250 mg IV x 1, then 1g IV q 48h (eAUC 497) Monitor renal function, Cx and clinical progression to narrow Vancomycin  levels as indicated  Height: 5' (152.4 cm) Weight: 81.2 kg (179 lb) IBW/kg (Calculated) : 45.5  Temp (24hrs), Avg:99.3 F (37.4 C), Min:99 F (37.2 C), Max:99.8 F (37.7 C)  Recent Labs  Lab 04/30/24 1640 04/30/24 1702  WBC 16.4*  --   CREATININE 1.51*  --   LATICACIDVEN  --  1.4    Estimated Creatinine Clearance: 32.3 mL/min (A) (by C-G formula based on SCr of 1.51 mg/dL (H)).    Allergies[1]  Dorn Poot, PharmD, [facility] Clinical Pharmacist ED Pharmacist Phone # [phone] 04/30/2024 11:16 PM      [1]  Allergies Allergen Reactions   Lisinopril Diarrhea   Metformin Diarrhea

## 2024-04-30 NOTE — ED Notes (Signed)
 Patient transported to X-ray

## 2024-04-30 NOTE — ED Notes (Signed)
IV Team at [facility].

## 2024-04-30 NOTE — Progress Notes (Signed)
 Hand surgery consulted for left hand/wrist infection presumed cellulitis after dog bite  Recommend MRI left hand and wrist to rule out abscess, continue IV antibiotics   Formal consult note to follow  Heather Quale PA-C [facility] [phone]

## 2024-04-30 NOTE — ED Notes (Signed)
 RN assumed care of pt, found her alert and oriented in bed w/ daughter bedside.  Pt indicated that her pain continued to be a 10/10 however pt appears to be relaxed and in no sign of distress.  We had a conversation about the pain expectation until the infection is gone.  Pt indicated that she understood.  Pt asked about if she was leaving or staying, her disabled spouse in in the car and her daughter needs to go home.  RN will confirm and then update.

## 2024-04-30 NOTE — ED Provider Notes (Signed)
 " [place] EMERGENCY DEPARTMENT AT [facility] Provider Note   CSN: [id number] Arrival date & time: 04/30/24  1546     Patient presents with: Animal Bite   Sheila [name] is a 74 y.o. female who presents today for evaluation of left hand pain after animal bite.  Patient reports she suffered a dog bite on 28 December on her left hand.  Dog is known and vaccinated.  Was seen at outside hospital and received doxycycline  which she has been compliant with.  Did suffer a reinjury a few days ago when a dog jumped on her and scratched her hand.  Patient has since had worsening pain and swelling to the area.  Patient is left-hand dominant.  Denies any systemic symptoms such as fevers or chills.  Due to ongoing pain is presented today for further evaluation.    Animal Bite      Prior to Admission medications  Medication Sig Start Date End Date Taking? Authorizing Provider  albuterol  (VENTOLIN  HFA) 108 (90 Base) MCG/ACT inhaler Inhale 2 puffs into the lungs every 6 (six) hours as needed for wheezing or shortness of breath.    [provider]  ASPERCREME LIDOCAINE  4 % CREA Apply 1 application topically 4 (four) times daily as needed (for pain).    [provider]  Budeson-Glycopyrrol-Formoterol  (BREZTRI  AEROSPHERE) 160-9-4.8 MCG/ACT AERO Inhale 2 puffs into the lungs in the morning and at bedtime. 01/14/23   [name], MD  budesonide -formoterol  (SYMBICORT ) 80-4.5 MCG/ACT inhaler Take 2 puffs first thing in am and then another 2 puffs about 12 hours later. 01/14/23   [name], MD  CONTOUR NEXT TEST test strip 4 (four) times daily. for testing 03/13/18   [provider]  doxycycline  (VIBRAMYCIN ) 100 MG capsule Take 1 capsule (100 mg total) by mouth 2 (two) times daily. 04/27/24   Raspet, Erin K, PA-C  famotidine  (PEPCID ) 20 MG tablet One after supper 01/14/23   [name], MD  fluticasone  (FLONASE ) 50 MCG/ACT nasal spray Place 1 spray into both nostrils 2  (two) times daily. 11/14/22   Stuart Vernell Norris, PA-C  furosemide  (LASIX ) 40 MG tablet Take 40 mg by mouth 2 (two) times daily. 04/26/18   [provider]  GLOBAL EASE INJECT PEN NEEDLES 31G X 5 MM MISC 4 (four) times daily. 04/17/18   [provider]  HYDROcodone -acetaminophen  (NORCO/VICODIN) 5-325 MG tablet Take 0.5-1 tablets by mouth 2 (two) times daily as needed for up to 3 days. 04/27/24 04/30/24  Raspet, Rocky POUR, PA-C  insulin  aspart (FIASP  FLEXTOUCH) 100 UNIT/ML FlexTouch Pen Inject into the skin.    [provider]  JARDIANCE 25 MG TABS tablet Take 12.5 mg by mouth in the morning.    [provider]  Magnesium 500 MG CAPS Take by mouth.    [provider]  mometasone -formoterol  (DULERA) 100-5 MCG/ACT AERO Take 2 puffs first thing in am and then another 2 puffs about 12 hours later. 01/14/23   [name], MD  OVER THE COUNTER MEDICATION Magnessium 500mg  daily  Vit d one daily  Potassium 99mg  one daily    [provider]  pantoprazole  (PROTONIX ) 40 MG tablet Take 1 tablet (40 mg total) by mouth daily. Take 30-60 min before first meal of the day 01/14/23   [name], MD  Potassium 99 MG TABS Take by mouth.    [provider]  TRESIBA FLEXTOUCH 200 UNIT/ML SOPN Inject 52 Units into the skin at  bedtime. 03/13/18   [provider]  VITAMIN D PO Take by mouth.    [provider]  Insulin  Detemir (LEVEMIR FLEXTOUCH) 100 UNIT/ML Pen Levemir FlexTouch U-100 Insulin  100 unit/mL (3 mL) subcutaneous pen  05/03/18  [provider]    Allergies: Lisinopril and Metformin    Review of Systems  Updated Vital Signs BP (!) 158/71 (BP Location: Right Arm)   Pulse 92   Temp 99 F (37.2 C) (Oral)   Resp 19   Ht 5' (1.524 m)   Wt 81.2 kg   SpO2 100%   BMI 34.96 kg/m   Physical Exam Constitutional:      General: She is not in acute distress.    Appearance: Normal appearance.  HENT:     Head:  Normocephalic and atraumatic.     Right Ear: External ear normal.     Left Ear: External ear normal.     Nose: Nose normal.     Mouth/Throat:     Mouth: Mucous membranes are moist.  Eyes:     Extraocular Movements: Extraocular movements intact.     Pupils: Pupils are equal, round, and reactive to light.  Cardiovascular:     Rate and Rhythm: Normal rate and regular rhythm.     Pulses: Normal pulses.  Pulmonary:     Effort: Pulmonary effort is normal. No respiratory distress.     Breath sounds: Normal breath sounds.  Abdominal:     General: Abdomen is flat.     Palpations: Abdomen is soft.  Musculoskeletal:        General: Swelling and tenderness present. Normal range of motion.     Cervical back: Normal range of motion.  Skin:    Capillary Refill: Capillary refill takes less than 2 seconds.     Findings: Erythema present.  Neurological:     General: No focal deficit present.     Mental Status: She is alert and oriented to person, place, and time.  Psychiatric:        Mood and Affect: Mood normal.     (all labs ordered are listed, but only abnormal results are displayed) Labs Reviewed  CULTURE, BLOOD (ROUTINE X 2)  CULTURE, BLOOD (ROUTINE X 2)  COMPREHENSIVE METABOLIC PANEL WITH GFR  CBC WITH DIFFERENTIAL/PLATELET  PROTIME-INR  I-STAT CG4 LACTIC ACID, ED    EKG: None  Radiology: No results found.   Medical Decision Making Risk Prescription drug management. Decision regarding hospitalization.   Patient is a 74 year old female who presents today for evaluation of worsening left hand pain, swelling, erythema in the setting of previous dog bite.  On initial assessment patient was noted to be hypertensive but otherwise hemodynamically stable and afebrile.  On visit assessment patient was interviewed resting comfortably without acute distress.  Physical exam notable for swelling to the radial aspect of the left hand.  Swelling and erythema with palpable calor that is  localized to the radial aspect involving the second MCP extending to the 1st and 2nd webspace.  Patient's thumb is also noted to be swollen.  No flexor surface tenderness to palpation.  Patient does have limited range of motion of the wrist as well as digits due to degree of swelling.  Patient had laboratory evaluation that was notable for leukocytosis of 16.4 and on my chart review patient's WBC has been elevated similarly in the past.  Patient otherwise without any abnormalities on metabolic panel aside from mild hypokalemia.  Patient also obtain an x-ray of her left  hand did not show any acute abnormalities however did demonstrate soft tissue swelling.  I suspect that patient's presentation likely related to cellulitis in the setting of recent animal bite.  There is no evidence of acute fracture.  Based on her current presentation I have lower concerns at this time for any frank abscess.  Lower concern at this time for flexor tenosynovitis.  Patient was provided Unasyn  here in the emergency department as well as analgesia.  Given that she has had worsening symptoms despite outpatient oral antibiotics, will need mission at this time for further IV antibiotics and potential hand consult patient.  Patient was ultimately admitted to the inpatient medicine service for further management.   Final diagnoses:  Dog bite, initial encounter  Cellulitis of left upper extremity    ED Discharge Orders     None          Laurita Sieving, MD 04/30/24 2231    Tonia Chew, MD 04/30/24 2239  "

## 2024-04-30 NOTE — ED Triage Notes (Signed)
 Pt came in via POV d/t a dog bite that occurred on the 28th of December on her Lt hand. Dog is known, has been vaccinated. Was seen at [facility] in [place] & she has been put on ABT & Hydrocodone  & denies that it helps with the pain. A/Ox4, rates her pain 10/10 during triage.

## 2024-04-30 NOTE — H&P (Signed)
 " History and Physical      Sheila [name]:[id number] DOB: [birth year] DOA: 04/30/2024; DOS: 04/30/2024  PCP: Sheila Leta NOVAK, MD *** Patient coming from: home ***  I have personally briefly reviewed patient's old medical records in [facility]  Chief Complaint: ***  HPI: Sheila [name] is a 74 y.o. female with medical history significant for *** who is admitted to [facility] on 04/30/2024 with *** after presenting from home*** to [facility] ED complaining of ***.    ***       ***   ED Course:  Vital signs in the ED were notable for the following: ***  Labs were notable for the following: ***  Per my interpretation, EKG in ED demonstrated the following:  ***  Imaging in the ED, per corresponding formal radiology read, was notable for the following:  ***  I have asked EDP to please reach out to on-call hand surgery to request consult.  While in the ED, the following were administered: ***  Subsequently, the patient was admitted  ***  ***red    Review of Systems: As per HPI otherwise 10 point review of systems negative.   Past Medical History:  Diagnosis Date   DM (diabetes mellitus) (HCC)    GERD (gastroesophageal reflux disease)    Gout    Hyperlipidemia    Hypertension     Past Surgical History:  Procedure Laterality Date   BIOPSY  02/13/2021   Procedure: BIOPSY;  Surgeon: Eartha Angelia Sieving, MD;  Location: [facility];  Service: Gastroenterology;;   ESOPHAGEAL DILATION N/A 02/13/2021   Procedure: ESOPHAGEAL DILATION;  Surgeon: Eartha Angelia Sieving, MD;  Location: [facility];  Service: Gastroenterology;  Laterality: N/A;   ESOPHAGOGASTRODUODENOSCOPY N/A 11/07/2020   Cirigliano: r/t food impaction in lower third of esophagus, food bolus advanced into stomach with air insufflation and gentle endoscopic pressure, small hh, bening appearing esphageal stenosis, normal stomach and duodenum, no specimens   ESOPHAGOGASTRODUODENOSCOPY (EGD) WITH  PROPOFOL  N/A 02/13/2021   Procedure: ESOPHAGOGASTRODUODENOSCOPY (EGD) WITH PROPOFOL ;  Surgeon: Eartha Angelia Sieving, MD;  Location: [facility];  Service: Gastroenterology;  Laterality: N/A;   FOREIGN BODY REMOVAL  11/07/2020   Procedure: FOREIGN BODY REMOVAL;  Surgeon: San Sandor GAILS, DO;  Location: [facility];  Service: Gastroenterology;;   REVERSE SHOULDER ARTHROPLASTY Right 09/17/2020   Procedure: REVERSE SHOULDER ARTHROPLASTY;  Surgeon: Cristy Bonner DASEN, MD;  Location: [facility];  Service: Orthopedics;  Laterality: Right;    Social History:  reports that she quit smoking about 49 years ago. Her smoking use included cigarettes. She started smoking about 51 years ago. She has a 2 pack-year smoking history. She has never used smokeless tobacco. She reports that she does not drink alcohol  and does not use drugs.   Allergies[1]  Family History  Problem Relation Age of Onset   Heart disease Father     Family history reviewed and not pertinent ***   Prior to Admission medications  Medication Sig Start Date End Date Taking? Authorizing Provider  albuterol  (VENTOLIN  HFA) 108 (90 Base) MCG/ACT inhaler Inhale 2 puffs into the lungs every 6 (six) hours as needed for wheezing or shortness of breath.   Yes [provider]  doxycycline  (VIBRAMYCIN ) 100 MG capsule Take 1 capsule (100 mg total) by mouth 2 (two) times daily. 04/27/24  Yes Raspet, Erin K, PA-C  famotidine  (PEPCID ) 20 MG tablet One after supper Patient taking differently: Take 20 mg by mouth daily as needed  for heartburn or indigestion. One after supper 01/14/23  Yes Sheila Ozell NOVAK, MD  furosemide  (LASIX ) 40 MG tablet Take 40 mg by mouth in the morning and at bedtime. 04/26/18  Yes [provider]  HUMALOG KWIKPEN 100 UNIT/ML KwikPen Inject into the skin. 03/29/24  Yes [provider]  traMADol  (ULTRAM ) 50 MG tablet Take 50 mg by mouth 3 (three) times daily as needed. 04/25/24  Yes  [provider]  amoxicillin -clavulanate (AUGMENTIN ) 875-125 MG tablet Take 1 tablet by mouth every 12 (twelve) hours. Patient not taking: Reported on 04/30/2024 04/30/24   [provider]  ASPERCREME LIDOCAINE  4 % CREA Apply 1 application  topically 4 (four) times daily as needed (Hand pain; joint pain).    [provider]  Budeson-Glycopyrrol-Formoterol  (BREZTRI  AEROSPHERE) 160-9-4.8 MCG/ACT AERO Inhale 2 puffs into the lungs in the morning and at bedtime. Patient not taking: Reported on 04/30/2024 01/14/23   Sheila Ozell NOVAK, MD  budesonide -formoterol  (SYMBICORT ) 80-4.5 MCG/ACT inhaler Take 2 puffs first thing in am and then another 2 puffs about 12 hours later. Patient not taking: Reported on 04/30/2024 01/14/23   Sheila Ozell NOVAK, MD  HYDROcodone -acetaminophen  (NORCO/VICODIN) 5-325 MG tablet Take 0.5-1 tablets by mouth 2 (two) times daily as needed for up to 3 days. Patient not taking: Reported on 04/30/2024 04/27/24 04/30/24  Raspet, Erin K, PA-C  JARDIANCE 25 MG TABS tablet Take 12.5 mg by mouth in the morning.    [provider]  Magnesium 500 MG CAPS Take by mouth.    [provider]  mometasone -formoterol  (DULERA) 100-5 MCG/ACT AERO Take 2 puffs first thing in am and then another 2 puffs about 12 hours later. 01/14/23   Sheila Ozell NOVAK, MD  OVER THE COUNTER MEDICATION Magnessium 500mg  daily  Vit d one daily  Potassium 99mg  one daily    [provider]  pantoprazole  (PROTONIX ) 40 MG tablet Take 1 tablet (40 mg total) by mouth daily. Take 30-60 min before first meal of the day 01/14/23   Sheila Ozell NOVAK, MD  Potassium 99 MG TABS Take by mouth.    [provider]  TRESIBA FLEXTOUCH 200 UNIT/ML SOPN Inject 52 Units into the skin at bedtime. 03/13/18   [provider]  Insulin  Detemir (LEVEMIR FLEXTOUCH) 100 UNIT/ML Pen Levemir FlexTouch U-100 Insulin  100 unit/mL (3 mL) subcutaneous pen  05/03/18  [provider]      Objective    Physical Exam: Vitals:   04/30/24 1551 04/30/24 1627 04/30/24 1850 04/30/24 2204  BP: (!) 158/71  (!) 123/54 124/64  Pulse: 92  77 87  Resp: 19  18 16   Temp: 99 F (37.2 C)  99.8 F (37.7 C) 99.2 F (37.3 C)  TempSrc: Oral  Oral Oral  SpO2: 100%  100% 100%  Weight:  81.2 kg    Height:  5' (1.524 m)      General: appears to be stated age; alert, oriented Skin: warm, dry, no rash Head:  AT/[place] Mouth:  Oral mucosa membranes appear moist, normal dentition Neck: supple; trachea midline Heart:  RRR; did not appreciate any M/R/G Lungs: CTAB, did not appreciate any wheezes, rales, or rhonchi Abdomen: + BS; soft, ND, NT Vascular: 2+ pedal pulses b/l; 2+ radial pulses b/l Extremities: no peripheral edema, no muscle wasting   ***   *** Neuro: strength and sensation intact in upper and lower extremities b/l  *** Neuro: 5/5 strength of the proximal and distal flexors and extensors of the upper and lower  extremities bilaterally; sensation intact in upper and lower extremities b/l; cranial nerves II through XII grossly intact; no pronator drift; no evidence suggestive of slurred speech, dysarthria, or facial droop; Normal muscle tone. No tremors. *** Neuro: In the setting of the patient's current mental status and associated inability to follow instructions, unable to perform full neurologic exam at this time.  As such, assessment of strength, sensation, and cranial nerves is limited at this time. Patient noted to spontaneously move all 4 extremities. No tremors.  ***        Labs on Admission: I have personally reviewed following labs and imaging studies  CBC: Recent Labs  Lab 04/30/24 1640  WBC 16.4*  NEUTROABS 11.8*  HGB 12.8  HCT 41.5  MCV 90.8  PLT 503*   Basic Metabolic Panel: Recent Labs  Lab 04/30/24 1640  NA 137  K 3.0*  CL 97*  CO2 23  GLUCOSE 156*  BUN 40*  CREATININE 1.51*  CALCIUM 10.2   GFR: Estimated Creatinine Clearance:  32.3 mL/min (A) (by C-G formula based on SCr of 1.51 mg/dL (H)). Liver Function Tests: Recent Labs  Lab 04/30/24 1640  AST 26  ALT 20  ALKPHOS 230*  BILITOT 0.4  PROT 8.9*  ALBUMIN 4.1   No results for input(s): LIPASE, AMYLASE in the last 168 hours. No results for input(s): AMMONIA in the last 168 hours. Coagulation Profile: Recent Labs  Lab 04/30/24 1640  INR 1.0   Cardiac Enzymes: No results for input(s): CKTOTAL, CKMB, CKMBINDEX, TROPONINI in the last 168 hours. BNP (last 3 results) No results for input(s): PROBNP in the last 8760 hours. HbA1C: No results for input(s): HGBA1C in the last 72 hours. CBG: No results for input(s): GLUCAP in the last 168 hours. Lipid Profile: No results for input(s): CHOL, HDL, LDLCALC, TRIG, CHOLHDL, LDLDIRECT in the last 72 hours. Thyroid  Function Tests: No results for input(s): TSH, T4TOTAL, FREET4, T3FREE, THYROIDAB in the last 72 hours. Anemia Panel: No results for input(s): VITAMINB12, FOLATE, FERRITIN, TIBC, IRON, RETICCTPCT in the last 72 hours. Urine analysis: No results found for: COLORURINE, APPEARANCEUR, LABSPEC, PHURINE, GLUCOSEU, HGBUR, BILIRUBINUR, KETONESUR, PROTEINUR, UROBILINOGEN, NITRITE, LEUKOCYTESUR  Radiological Exams on Admission: DG Hand Complete Left Result Date: 04/30/2024 EXAM: 3 OR MORE VIEW(S) XRAY OF THE LEFT HAND 04/30/2024 05:26:00 PM COMPARISON: None available. CLINICAL HISTORY: dog bite infection FINDINGS: BONES AND JOINTS: Old fracture deformity of the distal radius with mild residual dorsal tilt of the distal radial articular surface. Mild osteoarthritis of the radiocarpal joint. No malalignment. SOFT TISSUES: Soft tissue swelling of the hand and wrist. IMPRESSION: 1. Soft tissue swelling of the hand and wrist. 2. Old fracture deformity of the distal radius with mild residual dorsal tilt of the distal radial articular surface.  Electronically signed by: Dorethia Molt MD MD 04/30/2024 06:28 PM EST RP Workstation: [id number]      Assessment/Plan   Principal Problem:   Cellulitis of left hand   ***            ***                     ***                      ***                     ***                     ***                     ***                      ***                     ***                     ***                     ***                     ***                    ***                   ***  DVT prophylaxis: SCD's ***  Code Status: Full code*** Family Communication: none*** Disposition Plan: Per Rounding Team Consults called: I have asked EDP to please reach out to on-call hand surgery to request consult.;  Admission status: ***     I SPENT GREATER THAN 75 *** MINUTES IN CLINICAL CARE TIME/MEDICAL DECISION-MAKING IN COMPLETING THIS ADMISSION.      Eva [name] Roise Emert DO [facility]  From 7PM - 7AM   04/30/2024, 11:10 PM   ***      [1]  Allergies Allergen Reactions   Lisinopril Diarrhea   Metformin Diarrhea   "

## 2024-05-01 ENCOUNTER — Encounter (HOSPITAL_COMMUNITY): Payer: Self-pay | Admitting: Internal Medicine

## 2024-05-01 ENCOUNTER — Inpatient Hospital Stay (HOSPITAL_COMMUNITY)

## 2024-05-01 DIAGNOSIS — Z7984 Long term (current) use of oral hypoglycemic drugs: Secondary | ICD-10-CM | POA: Diagnosis not present

## 2024-05-01 DIAGNOSIS — L03012 Cellulitis of left finger: Secondary | ICD-10-CM | POA: Diagnosis present

## 2024-05-01 DIAGNOSIS — K219 Gastro-esophageal reflux disease without esophagitis: Secondary | ICD-10-CM | POA: Diagnosis present

## 2024-05-01 DIAGNOSIS — Z79899 Other long term (current) drug therapy: Secondary | ICD-10-CM | POA: Diagnosis not present

## 2024-05-01 DIAGNOSIS — N1831 Chronic kidney disease, stage 3a: Secondary | ICD-10-CM | POA: Diagnosis present

## 2024-05-01 DIAGNOSIS — I129 Hypertensive chronic kidney disease with stage 1 through stage 4 chronic kidney disease, or unspecified chronic kidney disease: Secondary | ICD-10-CM | POA: Diagnosis present

## 2024-05-01 DIAGNOSIS — E785 Hyperlipidemia, unspecified: Secondary | ICD-10-CM | POA: Diagnosis present

## 2024-05-01 DIAGNOSIS — M170 Bilateral primary osteoarthritis of knee: Secondary | ICD-10-CM | POA: Diagnosis present

## 2024-05-01 DIAGNOSIS — E876 Hypokalemia: Secondary | ICD-10-CM | POA: Diagnosis present

## 2024-05-01 DIAGNOSIS — Z96611 Presence of right artificial shoulder joint: Secondary | ICD-10-CM | POA: Diagnosis present

## 2024-05-01 DIAGNOSIS — Z8249 Family history of ischemic heart disease and other diseases of the circulatory system: Secondary | ICD-10-CM | POA: Diagnosis not present

## 2024-05-01 DIAGNOSIS — Z6836 Body mass index (BMI) 36.0-36.9, adult: Secondary | ICD-10-CM | POA: Diagnosis not present

## 2024-05-01 DIAGNOSIS — E11628 Type 2 diabetes mellitus with other skin complications: Secondary | ICD-10-CM | POA: Diagnosis present

## 2024-05-01 DIAGNOSIS — E1165 Type 2 diabetes mellitus with hyperglycemia: Secondary | ICD-10-CM | POA: Diagnosis present

## 2024-05-01 DIAGNOSIS — Z7951 Long term (current) use of inhaled steroids: Secondary | ICD-10-CM | POA: Diagnosis not present

## 2024-05-01 DIAGNOSIS — L03114 Cellulitis of left upper limb: Secondary | ICD-10-CM | POA: Diagnosis present

## 2024-05-01 DIAGNOSIS — M109 Gout, unspecified: Secondary | ICD-10-CM | POA: Diagnosis present

## 2024-05-01 DIAGNOSIS — Z794 Long term (current) use of insulin: Secondary | ICD-10-CM | POA: Diagnosis not present

## 2024-05-01 DIAGNOSIS — E66812 Obesity, class 2: Secondary | ICD-10-CM | POA: Diagnosis present

## 2024-05-01 DIAGNOSIS — W540XXA Bitten by dog, initial encounter: Secondary | ICD-10-CM | POA: Diagnosis not present

## 2024-05-01 DIAGNOSIS — Z87891 Personal history of nicotine dependence: Secondary | ICD-10-CM | POA: Diagnosis not present

## 2024-05-01 DIAGNOSIS — E1122 Type 2 diabetes mellitus with diabetic chronic kidney disease: Secondary | ICD-10-CM | POA: Diagnosis present

## 2024-05-01 DIAGNOSIS — Z888 Allergy status to other drugs, medicaments and biological substances status: Secondary | ICD-10-CM | POA: Diagnosis not present

## 2024-05-01 LAB — GLUCOSE, CAPILLARY
Glucose-Capillary: 124 mg/dL — ABNORMAL HIGH (ref 70–99)
Glucose-Capillary: 225 mg/dL — ABNORMAL HIGH (ref 70–99)

## 2024-05-01 LAB — HEMOGLOBIN A1C
Hgb A1c MFr Bld: 8.9 % — ABNORMAL HIGH (ref 4.8–5.6)
Mean Plasma Glucose: 208.73 mg/dL

## 2024-05-01 LAB — CBC WITH DIFFERENTIAL/PLATELET
Abs Immature Granulocytes: 0.15 K/uL — ABNORMAL HIGH (ref 0.00–0.07)
Basophils Absolute: 0.1 K/uL (ref 0.0–0.1)
Basophils Relative: 0 %
Eosinophils Absolute: 0.2 K/uL (ref 0.0–0.5)
Eosinophils Relative: 1 %
HCT: 37.3 % (ref 36.0–46.0)
Hemoglobin: 11.7 g/dL — ABNORMAL LOW (ref 12.0–15.0)
Immature Granulocytes: 1 %
Lymphocytes Relative: 20 %
Lymphs Abs: 3.7 K/uL (ref 0.7–4.0)
MCH: 28.3 pg (ref 26.0–34.0)
MCHC: 31.4 g/dL (ref 30.0–36.0)
MCV: 90.3 fL (ref 80.0–100.0)
Monocytes Absolute: 1.5 K/uL — ABNORMAL HIGH (ref 0.1–1.0)
Monocytes Relative: 8 %
Neutro Abs: 12.8 K/uL — ABNORMAL HIGH (ref 1.7–7.7)
Neutrophils Relative %: 70 %
Platelets: 445 K/uL — ABNORMAL HIGH (ref 150–400)
RBC: 4.13 MIL/uL (ref 3.87–5.11)
RDW: 14.7 % (ref 11.5–15.5)
WBC: 18.4 K/uL — ABNORMAL HIGH (ref 4.0–10.5)
nRBC: 0 % (ref 0.0–0.2)

## 2024-05-01 LAB — COMPREHENSIVE METABOLIC PANEL WITH GFR
ALT: 16 U/L (ref 0–44)
AST: 23 U/L (ref 15–41)
Albumin: 3.6 g/dL (ref 3.5–5.0)
Alkaline Phosphatase: 194 U/L — ABNORMAL HIGH (ref 38–126)
Anion gap: 17 — ABNORMAL HIGH (ref 5–15)
BUN: 40 mg/dL — ABNORMAL HIGH (ref 8–23)
CO2: 22 mmol/L (ref 22–32)
Calcium: 9.7 mg/dL (ref 8.9–10.3)
Chloride: 99 mmol/L (ref 98–111)
Creatinine, Ser: 1.37 mg/dL — ABNORMAL HIGH (ref 0.44–1.00)
GFR, Estimated: 41 mL/min — ABNORMAL LOW
Glucose, Bld: 144 mg/dL — ABNORMAL HIGH (ref 70–99)
Potassium: 3.3 mmol/L — ABNORMAL LOW (ref 3.5–5.1)
Sodium: 138 mmol/L (ref 135–145)
Total Bilirubin: 0.5 mg/dL (ref 0.0–1.2)
Total Protein: 7.9 g/dL (ref 6.5–8.1)

## 2024-05-01 LAB — PROTIME-INR
INR: 1 (ref 0.8–1.2)
Prothrombin Time: 14.3 s (ref 11.4–15.2)

## 2024-05-01 LAB — CBG MONITORING, ED
Glucose-Capillary: 123 mg/dL — ABNORMAL HIGH (ref 70–99)
Glucose-Capillary: 130 mg/dL — ABNORMAL HIGH (ref 70–99)

## 2024-05-01 LAB — C-REACTIVE PROTEIN: CRP: 12 mg/dL — ABNORMAL HIGH

## 2024-05-01 LAB — SEDIMENTATION RATE: Sed Rate: 97 mm/h — ABNORMAL HIGH (ref 0–22)

## 2024-05-01 MED ORDER — VANCOMYCIN HCL 1500 MG/300ML IV SOLN
1500.0000 mg | INTRAVENOUS | Status: DC
  Administered 2024-05-02: 1500 mg via INTRAVENOUS
  Filled 2024-05-01: qty 300

## 2024-05-01 MED ORDER — INSULIN GLARGINE-YFGN 100 UNIT/ML ~~LOC~~ SOLN
20.0000 [IU] | Freq: Every day | SUBCUTANEOUS | Status: DC
  Administered 2024-05-01 – 2024-05-06 (×6): 20 [IU] via SUBCUTANEOUS
  Filled 2024-05-01 (×7): qty 0.2

## 2024-05-01 MED ORDER — POTASSIUM CHLORIDE 10 MEQ/100ML IV SOLN
10.0000 meq | INTRAVENOUS | Status: AC
  Administered 2024-05-01 (×3): 10 meq via INTRAVENOUS
  Filled 2024-05-01 (×3): qty 100

## 2024-05-01 NOTE — ED Notes (Signed)
 Patient transported to MRI

## 2024-05-01 NOTE — Progress Notes (Signed)
 PHARMACY NOTE:  ANTIMICROBIAL RENAL DOSAGE ADJUSTMENT  Current antimicrobial regimen includes a mismatch between antimicrobial dosage and estimated renal function.  As per policy approved by the Pharmacy & Therapeutics and Medical Executive Committees, the antimicrobial dosage will be adjusted accordingly.  Current antimicrobial dosage:  Vancomycin  1g IV q 48h (eAUC 318)  Indication: cellulitis s/p dog bite  Renal Function:  Estimated Creatinine Clearance: 35.6 mL/min (A) (by C-G formula based on SCr of 1.37 mg/dL (H)). []      On intermittent HD, scheduled: []      On CRRT    Antimicrobial dosage has been changed to:  vanco 1500mg  q48h (eAUC 477)  Additional comments:   Thank you for allowing pharmacy to be a part of this patient's care.  Leonor GORMAN Bash, [facility] 05/01/2024 9:19 AM

## 2024-05-01 NOTE — ED Notes (Signed)
 Pt c/o increased pain and numbness to left hand; swelling appears at baseline, CMS intact. Tenderness on palpation.

## 2024-05-01 NOTE — Consult Note (Signed)
 " Orthopedic Hand Surgery Consultation:  Reason for Consult: Left hand infection Referring Physician: Dr. Jillian   HPI: Sheila [name] is a(an) 74 y.o. female who presented to the ED with left hand pain. She was admitted for IV antibiotics for left hand infection. She had a dog bite to her hand which reportedly healed then she got scratched by a dog and developed this pain, swelling and redness. No systemic symptoms.    Physical Exam: Left Upper Extremity Scabbed wound dorsal first webspace Dorsal soft tissue swelling and associated diffuse tenderness to palpation throughout the wrist and hand No areas of flutuance or induration  She can gently flex/extend the digits She lacks terminal wrist range of motion Sensation intact to light touch Fingers warm and well perfused   Assessment/Plan: Left hand/wrist cellulitis - continue IV antibiotics, range of motion, elevation. No areas to drain on clinical exam or on MRI. No indication for acute surgical intervention at present time.    Heather Quale PA-C Supervising physician Dr. Alyse [facility]  [facility] Office number: [phone] [address] [place], [place] [address]    Past Medical History:  Diagnosis Date   DM (diabetes mellitus) (HCC)    GERD (gastroesophageal reflux disease)    Gout    Hyperlipidemia    Hypertension     Past Surgical History:  Procedure Laterality Date   BIOPSY  02/13/2021   Procedure: BIOPSY;  Surgeon: Eartha Angelia Sieving, MD;  [facility];  Service: Gastroenterology;;   ESOPHAGEAL DILATION N/A 02/13/2021   Procedure: ESOPHAGEAL DILATION;  Surgeon: Eartha Angelia Sieving, MD;  [facility];  Service: Gastroenterology;  Laterality: N/A;   ESOPHAGOGASTRODUODENOSCOPY N/A 11/07/2020   Cirigliano: r/t food impaction in lower third of esophagus, food bolus advanced into stomach with air insufflation and gentle endoscopic pressure, small hh,  bening appearing esphageal stenosis, normal stomach and duodenum, no specimens   ESOPHAGOGASTRODUODENOSCOPY (EGD) WITH PROPOFOL  N/A 02/13/2021   Procedure: ESOPHAGOGASTRODUODENOSCOPY (EGD) WITH PROPOFOL ;  Surgeon: Eartha Angelia Sieving, MD;  [facility];  Service: Gastroenterology;  Laterality: N/A;   FOREIGN BODY REMOVAL  11/07/2020   Procedure: FOREIGN BODY REMOVAL;  Surgeon: San Sandor GAILS, DO;  Location: [facility];  Service: Gastroenterology;;   REVERSE SHOULDER ARTHROPLASTY Right 09/17/2020   Procedure: REVERSE SHOULDER ARTHROPLASTY;  Surgeon: Cristy Bonner DASEN, MD;  Location: [facility];  Service: Orthopedics;  Laterality: Right;    Family History  Problem Relation Age of Onset   Heart disease Father     Social History:  reports that she quit smoking about 49 years ago. Her smoking use included cigarettes. She started smoking about 51 years ago. She has a 2 pack-year smoking history. She has never used smokeless tobacco. She reports that she does not drink alcohol  and does not use drugs.  Allergies: Allergies[1]  Medications: reviewed, no changes to patient's home medications  Results for orders placed or performed during the hospital encounter of 04/30/24 (from the past 48 hours)  Comprehensive metabolic panel     Status: Abnormal   Collection Time: 04/30/24  4:40 PM  Result Value Ref Range   Sodium 137 135 - 145 mmol/L   Potassium 3.0 (L) 3.5 - 5.1 mmol/L   Chloride 97 (L) 98 - 111 mmol/L   CO2 23 22 - 32 mmol/L   Glucose, Bld 156 (H) 70 - 99 mg/dL    Comment: Glucose reference range applies only to samples taken after fasting for at least  8 hours.   BUN 40 (H) 8 - 23 mg/dL   Creatinine, Ser 8.48 (H) 0.44 - 1.00 mg/dL   Calcium 89.7 8.9 - 89.6 mg/dL   Total Protein 8.9 (H) 6.5 - 8.1 g/dL   Albumin 4.1 3.5 - 5.0 g/dL   AST 26 15 - 41 U/L   ALT 20 0 - 44 U/L   Alkaline Phosphatase 230 (H) 38 - 126 U/L   Total Bilirubin 0.4 0.0 - 1.2 mg/dL    GFR, Estimated 37 (L) >60 mL/min    Comment: (NOTE) Calculated using the CKD-EPI Creatinine Equation (2021)    Anion gap 18 (H) 5 - 15    Comment: Performed at [facility], [address], [place], [place] [address]  CBC with Differential     Status: Abnormal   Collection Time: 04/30/24  4:40 PM  Result Value Ref Range   WBC 16.4 (H) 4.0 - 10.5 K/uL   RBC 4.57 3.87 - 5.11 MIL/uL   Hemoglobin 12.8 12.0 - 15.0 g/dL   HCT 58.4 63.9 - 53.9 %   MCV 90.8 80.0 - 100.0 fL   MCH 28.0 26.0 - 34.0 pg   MCHC 30.8 30.0 - 36.0 g/dL   RDW 85.3 88.4 - 84.4 %   Platelets 503 (H) 150 - 400 K/uL   nRBC 0.0 0.0 - 0.2 %   Neutrophils Relative % 71 %   Neutro Abs 11.8 (H) 1.7 - 7.7 K/uL   Lymphocytes Relative 18 %   Lymphs Abs 3.0 0.7 - 4.0 K/uL   Monocytes Relative 8 %   Monocytes Absolute 1.3 (H) 0.1 - 1.0 K/uL   Eosinophils Relative 1 %   Eosinophils Absolute 0.1 0.0 - 0.5 K/uL   Basophils Relative 1 %   Basophils Absolute 0.1 0.0 - 0.1 K/uL   Immature Granulocytes 1 %   Abs Immature Granulocytes 0.11 (H) 0.00 - 0.07 K/uL    Comment: Performed at [facility], [address], [place], [place] [address]  Protime-INR     Status: None   Collection Time: 04/30/24  4:40 PM  Result Value Ref Range   Prothrombin Time 13.8 11.4 - 15.2 seconds   INR 1.0 0.8 - 1.2    Comment: (NOTE) INR goal varies based on device and disease states. Performed at [facility], [address], [place], [place] [address]   Magnesium     Status: None   Collection Time: 04/30/24  4:40 PM  Result Value Ref Range   Magnesium 1.9 1.7 - 2.4 mg/dL    Comment: Performed at [facility], [address] [place], [place] [address]  Hemoglobin A1c     Status: Abnormal   Collection Time: 04/30/24  4:40 PM  Result Value Ref Range   Hgb A1c MFr Bld 8.9 (H) 4.8 - 5.6 %    Comment: (NOTE) Diagnosis of Diabetes The following HbA1c ranges recommended by the American Diabetes Association (ADA) may be  used as an aid in the diagnosis of diabetes mellitus.  Hemoglobin             Suggested A1C NGSP%              Diagnosis  <5.7                   Non Diabetic  5.7-6.4                Pre-Diabetic  >6.4  Diabetic  <7.0                   Glycemic control for                       adults with diabetes.     Mean Plasma Glucose 208.73 mg/dL    Comment: Performed at [facility], [address], [place], [place] [address]  Blood Culture (routine x 2)     Status: None (Preliminary result)   Collection Time: 04/30/24  4:45 PM   Specimen: BLOOD RIGHT HAND  Result Value Ref Range   Specimen Description BLOOD RIGHT HAND    Special Requests      BOTTLES DRAWN AEROBIC AND ANAEROBIC Blood Culture adequate volume   Culture      NO GROWTH < 24 HOURS Performed at [facility], [address] [place], [place] [address]    Report Status PENDING   Blood Culture (routine x 2)     Status: None (Preliminary result)   Collection Time: 04/30/24  5:01 PM   Specimen: BLOOD  Result Value Ref Range   Specimen Description BLOOD SITE NOT SPECIFIED    Special Requests      BOTTLES DRAWN AEROBIC ONLY Blood Culture results may not be optimal due to an inadequate volume of blood received in culture bottles   Culture      NO GROWTH < 24 HOURS Performed at [facility], [address] [place], [place] [address]    Report Status PENDING   I-Stat Lactic Acid, ED     Status: None   Collection Time: 04/30/24  5:02 PM  Result Value Ref Range   Lactic Acid, Venous 1.4 0.5 - 1.9 mmol/L  CBC with Differential/Platelet     Status: Abnormal   Collection Time: 05/01/24 12:50 AM  Result Value Ref Range   WBC 18.4 (H) 4.0 - 10.5 K/uL   RBC 4.13 3.87 - 5.11 MIL/uL   Hemoglobin 11.7 (L) 12.0 - 15.0 g/dL   HCT 62.6 63.9 - 53.9 %   MCV 90.3 80.0 - 100.0 fL   MCH 28.3 26.0 - 34.0 pg   MCHC 31.4 30.0 - 36.0 g/dL   RDW 85.2 88.4 - 84.4 %   Platelets 445 (H) 150 - 400 K/uL   nRBC  0.0 0.0 - 0.2 %   Neutrophils Relative % 70 %   Neutro Abs 12.8 (H) 1.7 - 7.7 K/uL   Lymphocytes Relative 20 %   Lymphs Abs 3.7 0.7 - 4.0 K/uL   Monocytes Relative 8 %   Monocytes Absolute 1.5 (H) 0.1 - 1.0 K/uL   Eosinophils Relative 1 %   Eosinophils Absolute 0.2 0.0 - 0.5 K/uL   Basophils Relative 0 %   Basophils Absolute 0.1 0.0 - 0.1 K/uL   Immature Granulocytes 1 %   Abs Immature Granulocytes 0.15 (H) 0.00 - 0.07 K/uL    Comment: Performed at [facility], [address], [place], [place] [address]  Comprehensive metabolic panel with GFR     Status: Abnormal   Collection Time: 05/01/24 12:50 AM  Result Value Ref Range   Sodium 138 135 - 145 mmol/L   Potassium 3.3 (L) 3.5 - 5.1 mmol/L   Chloride 99 98 - 111 mmol/L   CO2 22 22 - 32 mmol/L   Glucose, Bld 144 (H) 70 - 99 mg/dL    Comment: Glucose reference range applies only to  samples taken after fasting for at least 8 hours.   BUN 40 (H) 8 - 23 mg/dL   Creatinine, Ser 8.62 (H) 0.44 - 1.00 mg/dL   Calcium 9.7 8.9 - 89.6 mg/dL   Total Protein 7.9 6.5 - 8.1 g/dL   Albumin 3.6 3.5 - 5.0 g/dL   AST 23 15 - 41 U/L    Comment: HEMOLYSIS AT THIS LEVEL MAY AFFECT RESULT   ALT 16 0 - 44 U/L   Alkaline Phosphatase 194 (H) 38 - 126 U/L   Total Bilirubin 0.5 0.0 - 1.2 mg/dL   GFR, Estimated 41 (L) >60 mL/min    Comment: (NOTE) Calculated using the CKD-EPI Creatinine Equation (2021)    Anion gap 17 (H) 5 - 15    Comment: Performed at [facility], [address] [place], [place] [address]  Protime-INR     Status: None   Collection Time: 05/01/24 12:50 AM  Result Value Ref Range   Prothrombin Time 14.3 11.4 - 15.2 seconds   INR 1.0 0.8 - 1.2    Comment: (NOTE) INR goal varies based on device and disease states. Performed at [facility], [address] [place], [place] [address]   C-reactive protein     Status: Abnormal   Collection Time: 05/01/24 12:50 AM  Result Value Ref Range   CRP 12.0 (H) <1.0 mg/dL     Comment: Performed at [facility], [address] [place], [place] [address]  Sedimentation rate     Status: Abnormal   Collection Time: 05/01/24 12:50 AM  Result Value Ref Range   Sed Rate 97 (H) 0 - 22 mm/hr    Comment: Performed at [facility], [address] [place], [place] [address]  CBG monitoring, ED     Status: Abnormal   Collection Time: 05/01/24  7:27 AM  Result Value Ref Range   Glucose-Capillary 123 (H) 70 - 99 mg/dL    Comment: Glucose reference range applies only to samples taken after fasting for at least 8 hours.  CBG monitoring, ED     Status: Abnormal   Collection Time: 05/01/24 12:21 PM  Result Value Ref Range   Glucose-Capillary 130 (H) 70 - 99 mg/dL    Comment: Glucose reference range applies only to samples taken after fasting for at least 8 hours.    MR HAND LEFT WO CONTRAST Result Date: 05/01/2024 CLINICAL DATA:  Soft tissue infection suspected. History of dog bite. EXAM: MRI OF THE LEFT HAND WITHOUT CONTRAST TECHNIQUE: Multiplanar, multisequence MR imaging of the left hand was attempted. Patient was unable to complete the examination. Only axial noncontrast imaging was performed. This results in a limited study. No intravenous contrast was administered. COMPARISON:  Radiographs of the left hand 04/30/2024 and left wrist 04/27/2024. FINDINGS: Bones/Joint/Cartilage No evidence of acute fracture, dislocation or bone destruction on axial imaging. In correlation with prior radiographs, posttraumatic deformity of the distal radius with scattered degenerative subchondral cysts in the proximal carpal row. Nonspecific small to moderate radiocarpal and distal radioulnar joint effusions. Ligaments Suboptimally evaluated on axial images. Muscles and Tendons The flexor and extensor tendons appear grossly intact, without significant tenosynovitis. Mild nonspecific T2 hyperintensity within the thumb flexor musculature. Soft tissues Nonspecific generalized  subcutaneous edema, greatest posteriorly. No organized fluid collection identified on limited non-contrast axial imaging. IMPRESSION: 1. Limited examination due to patient's inability to complete the examination. Only axial noncontrast imaging was obtained. Consider further evaluation with repeat MRI or  CT. 2. Nonspecific generalized subcutaneous edema, greatest posteriorly. No organized fluid collection identified on limited non-contrast axial imaging. 3. No evidence of osteomyelitis or septic arthritis. 4. Nonspecific radiocarpal and distal radioulnar joint effusions. 5. Posttraumatic deformity of the distal radius with scattered degenerative subchondral cysts in the proximal carpal row. Electronically Signed   By: Elsie Perone M.D.   On: 05/01/2024 10:30   DG Hand Complete Left Result Date: 04/30/2024 EXAM: 3 OR MORE VIEW(S) XRAY OF THE LEFT HAND 04/30/2024 05:26:00 PM COMPARISON: None available. CLINICAL HISTORY: dog bite infection FINDINGS: BONES AND JOINTS: Old fracture deformity of the distal radius with mild residual dorsal tilt of the distal radial articular surface. Mild osteoarthritis of the radiocarpal joint. No malalignment. SOFT TISSUES: Soft tissue swelling of the hand and wrist. IMPRESSION: 1. Soft tissue swelling of the hand and wrist. 2. Old fracture deformity of the distal radius with mild residual dorsal tilt of the distal radial articular surface. Electronically signed by: Dorethia Molt MD MD 04/30/2024 06:28 PM EST RP Workstation: [id number]    ROS: 14 point review of systems negative except per HPI     [1]  Allergies Allergen Reactions   Lisinopril Diarrhea   Metformin Diarrhea   "

## 2024-05-01 NOTE — Plan of Care (Signed)

## 2024-05-01 NOTE — TOC Initial Note (Signed)
 Transition of Care [facility]) - Initial/Assessment Note    Patient Details  Name: Sheila [name] MRN: [id number] Date of Birth: 11/21/1950  Transition of Care [facility]) CM/SW Contact:    Marval Gell, RN Phone Number: 05/01/2024, 3:31 PM  Clinical Narrative:                  Patient hospitalized with refractory cellulitis of the left hand in spite of outpatient oral antibiotics after presenting from home to [facility] ED complaining of left hand redness.  Currently n.p.o. for possible need of I&D.  ICM will continue to follow  Expected Discharge Plan: Home/Self Care Barriers to Discharge: Continued Medical Work up   Patient Goals and CMS Choice            Expected Discharge Plan and Services       Living arrangements for the past 2 months: Single Family Home                                      Prior Living Arrangements/Services Living arrangements for the past 2 months: Single Family Home Lives with:: Significant Other                   Activities of Daily Living   ADL Screening (condition at time of admission) Independently performs ADLs?: Yes (appropriate for developmental age) Is the patient deaf or have difficulty hearing?: No Does the patient have difficulty seeing, even when wearing glasses/contacts?: No Does the patient have difficulty concentrating, remembering, or making decisions?: No  Permission Sought/Granted                  Emotional Assessment              Admission diagnosis:  Animal bite [T14.8XXA] Cellulitis of left hand [L03.114] Cellulitis of finger of left hand [L03.012] Cellulitis of left upper extremity [L03.114] Patient Active Problem List   Diagnosis Date Noted   Hypokalemia 05/01/2024   Cellulitis of finger of left hand 05/01/2024   Cellulitis of left hand 04/30/2024   Callus 10/14/2021   Food impaction of esophagus    Hiatal hernia    Benign esophageal stricture    Postprocedural pneumothorax 09/17/2020   DM2  (diabetes mellitus, type 2) (HCC) 09/17/2020   GERD (gastroesophageal reflux disease) 09/17/2020   Leukocytosis 09/17/2020   S/P reverse total shoulder arthroplasty, right 09/17/2020   Acute respiratory failure with hypoxia (HCC) 09/17/2020   Gout attack 11/08/2018   Edema 10/24/2018   Ankle pain 06/06/2017   Upper airway cough syndrome vs cough variant asthma 12/16/2015   Abnormal WBC count 02/26/2015   Arthritis 01/22/2015   History of gout 01/22/2015   Mild intermittent asthma without complication 01/22/2015   PCP:  [name], MD Pharmacy:   [facility] [phone] - [place], [place] - [address] AT [facility] [address] [place] [address] Phone: [phone] Fax: [phone]     Social Drivers of Health (SDOH) Social History: SDOH Screenings   Food Insecurity: No Food Insecurity (05/01/2024)  Housing: Low Risk (05/01/2024)  Transportation Needs: No Transportation Needs (05/01/2024)  Utilities: Not At Risk (05/01/2024)  Social Connections: Socially Isolated (05/01/2024)  Tobacco Use: Medium Risk (05/01/2024)   SDOH Interventions: Food Insecurity Interventions: Intervention Not Indicated, Inpatient TOC   Readmission Risk Interventions     No data to  display

## 2024-05-01 NOTE — ED Notes (Signed)
 Floor notified patient coming to hall

## 2024-05-01 NOTE — Progress Notes (Signed)
 " PROGRESS NOTE  Sheila [name]  FMW:[id number] DOB: [birth year] DOA: 04/30/2024 PCP: [name], MD   Brief Narrative: Patient is a 74 year old female with history of diabetes type 2, chronic leukocytosis with baseline WBC count of 14-18 who presented with left hand redness.  Patient was hit by her dog on the dorsal surface of the left hand between the 1st and 2nd digits by her own dog at the end of December.  She took doxycycline  but the hand continued to be erythematous, edematous, painful .  Noticed diminished movement of the fingers, also reported subjective fever, chills.  History of diabetes type 2 with recent A1c of 8.5.  On presentation, she was afebrile.  Lab work showed potassium of 3, creatinine of 1.5, WC count of 16.4, initial lactic acid normal.  Hand surgery consulted.  Patient admitted for the management of left hand cellulitis.  Started on broad spectrum antibiotics.  MRI of the left hand and wrist did not show any abscess or underlying collection or signs of osteomyelitis or septic arthritis..  Orthopedics closely following, possible plan for I&D today  Assessment & Plan:  Principal Problem:   Cellulitis of left hand Active Problems:   DM2 (diabetes mellitus, type 2) (HCC)   Leukocytosis   Hypokalemia  Cellulitis of the left hand:.  Patient was licked by the dog and also hit on the dorsal surface of the left hand between the 1st and 2nd digits by her own dog at the end of December.  She took doxycycline  but the hand continued to be erythematous, edematous, painful .  Noticed diminished movement of the fingers, also reported subjective fever, chills.  Hand surgery following.   Currently n.p.o. for possible need of I&D.  Continue current antibiotic.  Follow-up cultures.  Continue gentle IV fluids. MRI of the hand and wrist today showed nonspecific generalized subcutaneous edema, greatest posteriorly.No organized fluid collection identified on limited non-contrast axial imaging.No  evidence of osteomyelitis or septic arthritis.  Chronic leukocytosis: Baseline WBC count of 14-18 K.  Continue monitoring  Hypokalemia: Supplemented potassium  Diabetes type 2: Takes insulin  at home.  Recent A1c 8.5.  Currently on sliding scale  CKD stage IIIa: Baseline creatinine around 1.3.  Currently kidney present close to baseline.  Obesity: BMI of 34.9      DVT prophylaxis:SCDs Start: 04/30/24 2306     Code Status: Full Code  Family Communication: Non at bedside  Patient status:obs  Patient is from :home  Anticipated discharge un:ynfz  Estimated DC date:1-2 days   Consultants: Hand surgery  Procedures:None yet  Antimicrobials:  Anti-infectives (From admission, onward)    Start     Dose/Rate Route Frequency Ordered Stop   05/02/24 2300  vancomycin  (VANCOCIN ) IVPB 1000 mg/200 mL premix        1,000 mg 200 mL/hr over 60 Minutes Intravenous Every 48 hours 04/30/24 2315     04/30/24 2330  vancomycin  (VANCOREADY) IVPB 1250 mg/250 mL        1,250 mg 166.7 mL/hr over 90 Minutes Intravenous  Once 04/30/24 2315 05/01/24 0241   04/30/24 2315  Ampicillin -Sulbactam (UNASYN ) 3 g in sodium chloride  0.9 % 100 mL IVPB        3 g 200 mL/hr over 30 Minutes Intravenous Every 8 hours 04/30/24 2313     04/30/24 1815  Ampicillin -Sulbactam (UNASYN ) 3 g in sodium chloride  0.9 % 100 mL IVPB        3 g 200 mL/hr over 30 Minutes Intravenous  Once 04/30/24  1814 04/30/24 1921       Subjective: Patient seen and examined at bedside today.  Lying in bed.  She was applying ice on the left hand.  Complains of pain and discomfort on the left hand.  Left hand appears erythematous, edematous.  She is afebrile.  Overall comfortable  Objective: Vitals:   05/01/24 0053 05/01/24 0200 05/01/24 0429 05/01/24 0730  BP: (!) 140/64  (!) 115/51 97/86  Pulse: 89  88 89  Resp: 16  18 18   Temp:  98.4 F (36.9 C) 99.3 F (37.4 C) 98.3 F (36.8 C)  TempSrc:  Oral Oral   SpO2: 100%  100% 100%   Weight:      Height:        Intake/Output Summary (Last 24 hours) at 05/01/2024 9187 Last data filed at 05/01/2024 0241 Gross per 24 hour  Intake 250 ml  Output --  Net 250 ml   Filed Weights   04/30/24 1627  Weight: 81.2 kg    Examination:  General exam: Overall comfortable, not in distress, obese HEENT: PERRL Respiratory system:  no wheezes or crackles  Cardiovascular system: S1 & S2 heard, RRR.  Gastrointestinal system: Abdomen is nondistended, soft and nontender. Central nervous system: Alert and oriented Extremities: Erythema, edema of the left hand, no draining ulcer or wound Skin: No rashes, no ulcers,no icterus     Data Reviewed: I have personally reviewed following labs and imaging studies  CBC: Recent Labs  Lab 04/30/24 1640 05/01/24 0050  WBC 16.4* 18.4*  NEUTROABS 11.8* 12.8*  HGB 12.8 11.7*  HCT 41.5 37.3  MCV 90.8 90.3  PLT 503* 445*   Basic Metabolic Panel: Recent Labs  Lab 04/30/24 1640 05/01/24 0050  NA 137 138  K 3.0* 3.3*  CL 97* 99  CO2 23 22  GLUCOSE 156* 144*  BUN 40* 40*  CREATININE 1.51* 1.37*  CALCIUM 10.2 9.7  MG 1.9  --      No results found for this or any previous visit (from the past 240 hours).   Radiology Studies: DG Hand Complete Left Result Date: 04/30/2024 EXAM: 3 OR MORE VIEW(S) XRAY OF THE LEFT HAND 04/30/2024 05:26:00 PM COMPARISON: None available. CLINICAL HISTORY: dog bite infection FINDINGS: BONES AND JOINTS: Old fracture deformity of the distal radius with mild residual dorsal tilt of the distal radial articular surface. Mild osteoarthritis of the radiocarpal joint. No malalignment. SOFT TISSUES: Soft tissue swelling of the hand and wrist. IMPRESSION: 1. Soft tissue swelling of the hand and wrist. 2. Old fracture deformity of the distal radius with mild residual dorsal tilt of the distal radial articular surface. Electronically signed by: Dorethia Molt MD MD 04/30/2024 06:28 PM EST RP Workstation: [id number]     Scheduled Meds:  insulin  aspart  0-5 Units Subcutaneous QHS   insulin  aspart  0-9 Units Subcutaneous TID WC   insulin  glargine-yfgn  20 Units Subcutaneous Q2200   Continuous Infusions:  ampicillin -sulbactam (UNASYN ) IV 3 g (05/01/24 0732)   lactated ringers  75 mL/hr at 05/01/24 0059   [START ON 05/02/2024] vancomycin        LOS: 0 days   Ivonne Mustache, MD [facility] P1/13/2026, 8:12 AM  "

## 2024-05-02 ENCOUNTER — Inpatient Hospital Stay (HOSPITAL_COMMUNITY)

## 2024-05-02 DIAGNOSIS — L03114 Cellulitis of left upper limb: Secondary | ICD-10-CM | POA: Diagnosis not present

## 2024-05-02 LAB — CBC
HCT: 35.2 % — ABNORMAL LOW (ref 36.0–46.0)
Hemoglobin: 11.1 g/dL — ABNORMAL LOW (ref 12.0–15.0)
MCH: 28.5 pg (ref 26.0–34.0)
MCHC: 31.5 g/dL (ref 30.0–36.0)
MCV: 90.5 fL (ref 80.0–100.0)
Platelets: 403 K/uL — ABNORMAL HIGH (ref 150–400)
RBC: 3.89 MIL/uL (ref 3.87–5.11)
RDW: 14.6 % (ref 11.5–15.5)
WBC: 22.1 K/uL — ABNORMAL HIGH (ref 4.0–10.5)
nRBC: 0 % (ref 0.0–0.2)

## 2024-05-02 LAB — GLUCOSE, CAPILLARY
Glucose-Capillary: 150 mg/dL — ABNORMAL HIGH (ref 70–99)
Glucose-Capillary: 123 mg/dL — ABNORMAL HIGH (ref 70–99)
Glucose-Capillary: 139 mg/dL — ABNORMAL HIGH (ref 70–99)
Glucose-Capillary: 186 mg/dL — ABNORMAL HIGH (ref 70–99)

## 2024-05-02 LAB — BASIC METABOLIC PANEL WITH GFR
Anion gap: 15 (ref 5–15)
BUN: 37 mg/dL — ABNORMAL HIGH (ref 8–23)
CO2: 19 mmol/L — ABNORMAL LOW (ref 22–32)
Calcium: 9.6 mg/dL (ref 8.9–10.3)
Chloride: 101 mmol/L (ref 98–111)
Creatinine, Ser: 1.36 mg/dL — ABNORMAL HIGH (ref 0.44–1.00)
GFR, Estimated: 41 mL/min — ABNORMAL LOW
Glucose, Bld: 166 mg/dL — ABNORMAL HIGH (ref 70–99)
Potassium: 3.8 mmol/L (ref 3.5–5.1)
Sodium: 135 mmol/L (ref 135–145)

## 2024-05-02 MED ORDER — POLYETHYLENE GLYCOL 3350 17 G PO PACK
17.0000 g | PACK | Freq: Every day | ORAL | Status: DC
  Administered 2024-05-02 – 2024-05-03 (×2): 17 g via ORAL
  Filled 2024-05-02 (×4): qty 1

## 2024-05-02 MED ORDER — ENOXAPARIN SODIUM 40 MG/0.4ML IJ SOSY
40.0000 mg | PREFILLED_SYRINGE | INTRAMUSCULAR | Status: DC
  Administered 2024-05-02 – 2024-05-07 (×6): 40 mg via SUBCUTANEOUS
  Filled 2024-05-02 (×6): qty 0.4

## 2024-05-02 MED ORDER — SENNOSIDES-DOCUSATE SODIUM 8.6-50 MG PO TABS
1.0000 | ORAL_TABLET | Freq: Two times a day (BID) | ORAL | Status: DC
  Administered 2024-05-02 – 2024-05-04 (×3): 1 via ORAL
  Filled 2024-05-02 (×6): qty 1

## 2024-05-02 MED ORDER — IOHEXOL 350 MG/ML SOLN
75.0000 mL | Freq: Once | INTRAVENOUS | Status: AC | PRN
  Administered 2024-05-02: 75 mL via INTRAVENOUS

## 2024-05-02 MED ORDER — HYDROXYZINE HCL 25 MG PO TABS
25.0000 mg | ORAL_TABLET | Freq: Once | ORAL | Status: AC | PRN
  Administered 2024-05-03: 25 mg via ORAL
  Filled 2024-05-02: qty 1

## 2024-05-02 NOTE — Progress Notes (Signed)
 " Orthopedic Hand Surgery Progress Note:  Reason for Consult: Left hand infection Referring Physician: Dr. Jillian     HPI: Sheila [name] is a(an) 74 y.o. female who presented to the ED with left hand pain. She was admitted for IV antibiotics for left hand infection. She had a dog bite to her hand which reportedly healed then she got scratched by a dog and developed this pain, swelling and redness. She reports her hand is going better today, less pain. Still decreased motion in the fingers and wrist. No systemic symptoms.      Physical Exam: Left Upper Extremity Scabbed wound dorsal first webspace Dorsal soft tissue swelling and associated tenderness over radial dorsal aspect of hand Non tender over the wrist No areas of flutuance or induration  She can gently flex/extend the digits No pain with micromotion of the wrist or MCPs She lacks terminal wrist and finger range of motion Sensation intact to light touch Fingers warm and well perfused    Assessment/plan: Left hand cellulitis - WBC continues to elevate, now at 22, MRI without drainable abscess however limited - given worsening white count and continued pain although improving somewhat would recommend CT hand and wrist with contrast to ensure no abscess, please NPO until CT reviewed    Heather Quale PA-C  Orthopedic Hand Surgery [facility] Office number: [phone] [address], [address] [place], [place] [address]    Past Medical History:  Diagnosis Date   DM (diabetes mellitus) (HCC)    GERD (gastroesophageal reflux disease)    Gout    Hyperlipidemia    Hypertension     Past Surgical History:  Procedure Laterality Date   BIOPSY  02/13/2021   Procedure: BIOPSY;  Surgeon: Eartha Angelia Sieving, MD;  [facility];  Service: Gastroenterology;;   ESOPHAGEAL DILATION N/A 02/13/2021   Procedure: ESOPHAGEAL DILATION;  Surgeon: Eartha Angelia Sieving, MD;  [facility];  Service:  Gastroenterology;  Laterality: N/A;   ESOPHAGOGASTRODUODENOSCOPY N/A 11/07/2020   Cirigliano: r/t food impaction in lower third of esophagus, food bolus advanced into stomach with air insufflation and gentle endoscopic pressure, small hh, bening appearing esphageal stenosis, normal stomach and duodenum, no specimens   ESOPHAGOGASTRODUODENOSCOPY (EGD) WITH PROPOFOL  N/A 02/13/2021   Procedure: ESOPHAGOGASTRODUODENOSCOPY (EGD) WITH PROPOFOL ;  Surgeon: Eartha Angelia Sieving, MD;  [facility];  Service: Gastroenterology;  Laterality: N/A;   FOREIGN BODY REMOVAL  11/07/2020   Procedure: FOREIGN BODY REMOVAL;  Surgeon: San Sandor GAILS, DO;  [facility];  Service: Gastroenterology;;   REVERSE SHOULDER ARTHROPLASTY Right 09/17/2020   Procedure: REVERSE SHOULDER ARTHROPLASTY;  Surgeon: Cristy Bonner DASEN, MD;  Location: [facility];  Service: Orthopedics;  Laterality: Right;    Family History  Problem Relation Age of Onset   Heart disease Father     Social History:  reports that she quit smoking about 49 years ago. Her smoking use included cigarettes. She started smoking about 51 years ago. She has a 2 pack-year smoking history. She has never used smokeless tobacco. She reports that she does not drink alcohol  and does not use drugs.  Allergies: Allergies[1]  Medications: reviewed, no changes to patient's home medications  Results for orders placed or performed during the hospital encounter of 04/30/24 (from the past 48 hours)  Comprehensive metabolic panel     Status: Abnormal   Collection Time: 04/30/24  4:40 PM  Result Value Ref Range   Sodium 137 135 - 145 mmol/L   Potassium 3.0 (  L) 3.5 - 5.1 mmol/L   Chloride 97 (L) 98 - 111 mmol/L   CO2 23 22 - 32 mmol/L   Glucose, Bld 156 (H) 70 - 99 mg/dL    Comment: Glucose reference range applies only to samples taken after fasting for at least 8 hours.   BUN 40 (H) 8 - 23 mg/dL   Creatinine, Ser 8.48 (H) 0.44 -  1.00 mg/dL   Calcium 89.7 8.9 - 89.6 mg/dL   Total Protein 8.9 (H) 6.5 - 8.1 g/dL   Albumin 4.1 3.5 - 5.0 g/dL   AST 26 15 - 41 U/L   ALT 20 0 - 44 U/L   Alkaline Phosphatase 230 (H) 38 - 126 U/L   Total Bilirubin 0.4 0.0 - 1.2 mg/dL   GFR, Estimated 37 (L) >60 mL/min    Comment: (NOTE) Calculated using the CKD-EPI Creatinine Equation (2021)    Anion gap 18 (H) 5 - 15    Comment: Performed at [facility], [address], [place], [place] [address]  CBC with Differential     Status: Abnormal   Collection Time: 04/30/24  4:40 PM  Result Value Ref Range   WBC 16.4 (H) 4.0 - 10.5 K/uL   RBC 4.57 3.87 - 5.11 MIL/uL   Hemoglobin 12.8 12.0 - 15.0 g/dL   HCT 58.4 63.9 - 53.9 %   MCV 90.8 80.0 - 100.0 fL   MCH 28.0 26.0 - 34.0 pg   MCHC 30.8 30.0 - 36.0 g/dL   RDW 85.3 88.4 - 84.4 %   Platelets 503 (H) 150 - 400 K/uL   nRBC 0.0 0.0 - 0.2 %   Neutrophils Relative % 71 %   Neutro Abs 11.8 (H) 1.7 - 7.7 K/uL   Lymphocytes Relative 18 %   Lymphs Abs 3.0 0.7 - 4.0 K/uL   Monocytes Relative 8 %   Monocytes Absolute 1.3 (H) 0.1 - 1.0 K/uL   Eosinophils Relative 1 %   Eosinophils Absolute 0.1 0.0 - 0.5 K/uL   Basophils Relative 1 %   Basophils Absolute 0.1 0.0 - 0.1 K/uL   Immature Granulocytes 1 %   Abs Immature Granulocytes 0.11 (H) 0.00 - 0.07 K/uL    Comment: Performed at [facility], [address] [place], [place] [address]  Protime-INR     Status: None   Collection Time: 04/30/24  4:40 PM  Result Value Ref Range   Prothrombin Time 13.8 11.4 - 15.2 seconds   INR 1.0 0.8 - 1.2    Comment: (NOTE) INR goal varies based on device and disease states. Performed at [facility], [address], [place], [place] [address]   Magnesium     Status: None   Collection Time: 04/30/24  4:40 PM  Result Value Ref Range   Magnesium 1.9 1.7 - 2.4 mg/dL    Comment: Performed at [facility], [address] [place], [place] [address]  Hemoglobin A1c     Status:  Abnormal   Collection Time: 04/30/24  4:40 PM  Result Value Ref Range   Hgb A1c MFr Bld 8.9 (H) 4.8 - 5.6 %    Comment: (NOTE) Diagnosis of Diabetes The following HbA1c ranges recommended by the American Diabetes Association (ADA) may be used as an aid in the diagnosis of diabetes mellitus.  Hemoglobin             Suggested A1C NGSP%  Diagnosis  <5.7                   Non Diabetic  5.7-6.4                Pre-Diabetic  >6.4                   Diabetic  <7.0                   Glycemic control for                       adults with diabetes.     Mean Plasma Glucose 208.73 mg/dL    Comment: Performed at [facility], [address] [place], [place] [address]  Blood Culture (routine x 2)     Status: None (Preliminary result)   Collection Time: 04/30/24  4:45 PM   Specimen: BLOOD RIGHT HAND  Result Value Ref Range   Specimen Description BLOOD RIGHT HAND    Special Requests      BOTTLES DRAWN AEROBIC AND ANAEROBIC Blood Culture adequate volume   Culture      NO GROWTH 2 DAYS Performed at [facility], [address] [place], [place] [address]    Report Status PENDING   Blood Culture (routine x 2)     Status: None (Preliminary result)   Collection Time: 04/30/24  5:01 PM   Specimen: BLOOD  Result Value Ref Range   Specimen Description BLOOD SITE NOT SPECIFIED    Special Requests      BOTTLES DRAWN AEROBIC ONLY Blood Culture results may not be optimal due to an inadequate volume of blood received in culture bottles   Culture      NO GROWTH 2 DAYS Performed at [facility], [address], [place], [place] [address]    Report Status PENDING   I-Stat Lactic Acid, ED     Status: None   Collection Time: 04/30/24  5:02 PM  Result Value Ref Range   Lactic Acid, Venous 1.4 0.5 - 1.9 mmol/L  CBC with Differential/Platelet     Status: Abnormal   Collection Time: 05/01/24 12:50 AM  Result Value Ref Range   WBC 18.4 (H) 4.0 - 10.5 K/uL   RBC 4.13  3.87 - 5.11 MIL/uL   Hemoglobin 11.7 (L) 12.0 - 15.0 g/dL   HCT 62.6 63.9 - 53.9 %   MCV 90.3 80.0 - 100.0 fL   MCH 28.3 26.0 - 34.0 pg   MCHC 31.4 30.0 - 36.0 g/dL   RDW 85.2 88.4 - 84.4 %   Platelets 445 (H) 150 - 400 K/uL   nRBC 0.0 0.0 - 0.2 %   Neutrophils Relative % 70 %   Neutro Abs 12.8 (H) 1.7 - 7.7 K/uL   Lymphocytes Relative 20 %   Lymphs Abs 3.7 0.7 - 4.0 K/uL   Monocytes Relative 8 %   Monocytes Absolute 1.5 (H) 0.1 - 1.0 K/uL   Eosinophils Relative 1 %   Eosinophils Absolute 0.2 0.0 - 0.5 K/uL   Basophils Relative 0 %   Basophils Absolute 0.1 0.0 - 0.1 K/uL   Immature Granulocytes 1 %   Abs Immature Granulocytes 0.15 (H) 0.00 - 0.07 K/uL    Comment: Performed at [facility], [address], [place], [place] [address]  Comprehensive metabolic panel with GFR     Status: Abnormal   Collection Time: 05/01/24 12:50  AM  Result Value Ref Range   Sodium 138 135 - 145 mmol/L   Potassium 3.3 (L) 3.5 - 5.1 mmol/L   Chloride 99 98 - 111 mmol/L   CO2 22 22 - 32 mmol/L   Glucose, Bld 144 (H) 70 - 99 mg/dL    Comment: Glucose reference range applies only to samples taken after fasting for at least 8 hours.   BUN 40 (H) 8 - 23 mg/dL   Creatinine, Ser 8.62 (H) 0.44 - 1.00 mg/dL   Calcium 9.7 8.9 - 89.6 mg/dL   Total Protein 7.9 6.5 - 8.1 g/dL   Albumin 3.6 3.5 - 5.0 g/dL   AST 23 15 - 41 U/L    Comment: HEMOLYSIS AT THIS LEVEL MAY AFFECT RESULT   ALT 16 0 - 44 U/L   Alkaline Phosphatase 194 (H) 38 - 126 U/L   Total Bilirubin 0.5 0.0 - 1.2 mg/dL   GFR, Estimated 41 (L) >60 mL/min    Comment: (NOTE) Calculated using the CKD-EPI Creatinine Equation (2021)    Anion gap 17 (H) 5 - 15    Comment: Performed at [facility], [address], [place], [place] [address]  Protime-INR     Status: None   Collection Time: 05/01/24 12:50 AM  Result Value Ref Range   Prothrombin Time 14.3 11.4 - 15.2 seconds   INR 1.0 0.8 - 1.2    Comment: (NOTE) INR goal varies based on  device and disease states. Performed at [facility], [address], [place], [place] [address]   C-reactive protein     Status: Abnormal   Collection Time: 05/01/24 12:50 AM  Result Value Ref Range   CRP 12.0 (H) <1.0 mg/dL    Comment: Performed at [facility], [address] [place], [place] [address]  Sedimentation rate     Status: Abnormal   Collection Time: 05/01/24 12:50 AM  Result Value Ref Range   Sed Rate 97 (H) 0 - 22 mm/hr    Comment: Performed at [facility], [address] [place], [place] [address]  CBG monitoring, ED     Status: Abnormal   Collection Time: 05/01/24  7:27 AM  Result Value Ref Range   Glucose-Capillary 123 (H) 70 - 99 mg/dL    Comment: Glucose reference range applies only to samples taken after fasting for at least 8 hours.  CBG monitoring, ED     Status: Abnormal   Collection Time: 05/01/24 12:21 PM  Result Value Ref Range   Glucose-Capillary 130 (H) 70 - 99 mg/dL    Comment: Glucose reference range applies only to samples taken after fasting for at least 8 hours.  Glucose, capillary     Status: Abnormal   Collection Time: 05/01/24  4:46 PM  Result Value Ref Range   Glucose-Capillary 124 (H) 70 - 99 mg/dL    Comment: Glucose reference range applies only to samples taken after fasting for at least 8 hours.  Glucose, capillary     Status: Abnormal   Collection Time: 05/01/24  8:57 PM  Result Value Ref Range   Glucose-Capillary 225 (H) 70 - 99 mg/dL    Comment: Glucose reference range applies only to samples taken after fasting for at least 8 hours.   Comment 1 Notify RN   CBC     Status: Abnormal   Collection Time: 05/02/24  5:37 AM  Result Value Ref Range   WBC 22.1 (H) 4.0 - 10.5  K/uL   RBC 3.89 3.87 - 5.11 MIL/uL   Hemoglobin 11.1 (L) 12.0 - 15.0 g/dL   HCT 64.7 (L) 63.9 - 53.9 %   MCV 90.5 80.0 - 100.0 fL   MCH 28.5 26.0 - 34.0 pg   MCHC 31.5 30.0 - 36.0 g/dL   RDW 85.3 88.4 - 84.4 %   Platelets 403 (H) 150 - 400  K/uL   nRBC 0.0 0.0 - 0.2 %    Comment: Performed at [facility], [address] [place], [place] [address]  Basic metabolic panel with GFR     Status: Abnormal   Collection Time: 05/02/24  5:37 AM  Result Value Ref Range   Sodium 135 135 - 145 mmol/L   Potassium 3.8 3.5 - 5.1 mmol/L   Chloride 101 98 - 111 mmol/L   CO2 19 (L) 22 - 32 mmol/L   Glucose, Bld 166 (H) 70 - 99 mg/dL    Comment: Glucose reference range applies only to samples taken after fasting for at least 8 hours.   BUN 37 (H) 8 - 23 mg/dL   Creatinine, Ser 8.63 (H) 0.44 - 1.00 mg/dL   Calcium 9.6 8.9 - 89.6 mg/dL   GFR, Estimated 41 (L) >60 mL/min    Comment: (NOTE) Calculated using the CKD-EPI Creatinine Equation (2021)    Anion gap 15 5 - 15    Comment: Performed at [facility], [address] [place], [place] [address]  Glucose, capillary     Status: Abnormal   Collection Time: 05/02/24  8:06 AM  Result Value Ref Range   Glucose-Capillary 150 (H) 70 - 99 mg/dL    Comment: Glucose reference range applies only to samples taken after fasting for at least 8 hours.    MR HAND LEFT WO CONTRAST Result Date: 05/01/2024 CLINICAL DATA:  Soft tissue infection suspected. History of dog bite. EXAM: MRI OF THE LEFT HAND WITHOUT CONTRAST TECHNIQUE: Multiplanar, multisequence MR imaging of the left hand was attempted. Patient was unable to complete the examination. Only axial noncontrast imaging was performed. This results in a limited study. No intravenous contrast was administered. COMPARISON:  Radiographs of the left hand 04/30/2024 and left wrist 04/27/2024. FINDINGS: Bones/Joint/Cartilage No evidence of acute fracture, dislocation or bone destruction on axial imaging. In correlation with prior radiographs, posttraumatic deformity of the distal radius with scattered degenerative subchondral cysts in the proximal carpal row. Nonspecific small to moderate radiocarpal and distal radioulnar joint effusions. Ligaments  Suboptimally evaluated on axial images. Muscles and Tendons The flexor and extensor tendons appear grossly intact, without significant tenosynovitis. Mild nonspecific T2 hyperintensity within the thumb flexor musculature. Soft tissues Nonspecific generalized subcutaneous edema, greatest posteriorly. No organized fluid collection identified on limited non-contrast axial imaging. IMPRESSION: 1. Limited examination due to patient's inability to complete the examination. Only axial noncontrast imaging was obtained. Consider further evaluation with repeat MRI or CT. 2. Nonspecific generalized subcutaneous edema, greatest posteriorly. No organized fluid collection identified on limited non-contrast axial imaging. 3. No evidence of osteomyelitis or septic arthritis. 4. Nonspecific radiocarpal and distal radioulnar joint effusions. 5. Posttraumatic deformity of the distal radius with scattered degenerative subchondral cysts in the proximal carpal row. Electronically Signed   By: Elsie Perone M.D.   On: 05/01/2024 10:30   DG Hand Complete Left Result Date: 04/30/2024 EXAM: 3 OR MORE VIEW(S) XRAY OF THE LEFT HAND 04/30/2024 05:26:00 PM COMPARISON: None available. CLINICAL HISTORY: dog bite infection FINDINGS: BONES AND JOINTS: Old fracture  deformity of the distal radius with mild residual dorsal tilt of the distal radial articular surface. Mild osteoarthritis of the radiocarpal joint. No malalignment. SOFT TISSUES: Soft tissue swelling of the hand and wrist. IMPRESSION: 1. Soft tissue swelling of the hand and wrist. 2. Old fracture deformity of the distal radius with mild residual dorsal tilt of the distal radial articular surface. Electronically signed by: Dorethia Molt MD MD 04/30/2024 06:28 PM EST RP Workstation: [id number]    ROS: 14 point review of systems negative except per HPI     [1]  Allergies Allergen Reactions   Lisinopril Diarrhea   Metformin Diarrhea   "

## 2024-05-02 NOTE — Progress Notes (Signed)
 Patient attempted to get out of the bed several times, setting bed alarm off. Patient found angrily arguing with nurse about care plan. Charge nurse gave education on falls precautions and care plan. Patient refused bed alarm.

## 2024-05-02 NOTE — Discharge Instructions (Addendum)
 Sheila [name],  You were in the hospital with an infection of your wrist. This appears to have been an infection involving the skin of your wrist and thankfully not the joint. You have improved with antibiotics. You were also managed for gout and had some fluid drained from your knee. You have improved significantly, but you were recommended to discharge to a skilled nursing facility. You have declined to go to rehab and instead have decided to discharge home with home health services.   Social Connections     -[facility] [place]: [phone] / [address], [place], [place] [address]   ? -Dial 988: Talk lifeline 24/7.   ? -[place]  - [facility] Warmline: [phone]   ? PACE (Adult Program)    - Address: [address] [place], [place] [address]   - General office #:  [phone]   - Enrollment Phone #: [phone]   PACE program (Program of All-Inclusive Care for the Elderly):    a Medicare and Medicaid initiative that provides comprehensive medical and social services to frail seniors (55+) who need nursing home-level care but prefer to stay in their communities, allowing them to age in place with support like primary care, therapy, meals, and transportation, coordinated by an interdisciplinary team.     If you have both Medicare (for people 65+) and Medicaid (income-tested), then you may pay nothing.    People without Medicare or Medicaid pay a monthly premium.    The amount of this premium depends on your healthcare and financial needs.    Long-term care insurance may pay for your PACE care. This coverage is determined by your insurer.     [facility]    - [facility] Line: call toll free, available 24 hours a day, at [phone]     [place] [address]    [place] 2-1-1 is another useful way to locate resources in the community.   Http://lawrence.net/ find service information online. If you need additional  assistance, 2-1-1 Referral Specialists are available 24 hours a day, every day by dialing 2-1-1 or [phone] from any phone. The call is free, confidential, and available in any language.     Plate Method for Diabetes   Foods with carbohydrates make your blood glucose level go up. The plate method is a simple way to meal plan and control the amount of carbohydrate you eat.         Use the following guidance to build a healthy plate to control carbohydrates. Divide a 9-inch plate into 3 sections, and consider your beverage the 4th section of your meal: Food Group Examples of Foods/Beverages for This Section of your Meal  Section 1: Non-starchy vegetables Fill  of your plate to include non-starchy vegetables Asparagus, broccoli, brussels sprouts, cabbage, carrots, cauliflower, celery, cucumber, green beans, mushrooms, peppers, salad greens, tomatoes, or zucchini.  Section 2: Protein foods Fill  of your plate to include a lean protein Lean meat, poultry, fish, seafood, cheese, eggs, lean deli meat, tofu, beans, lentils, nuts or nut butters.  Section 3: Carbohydrate foods Fill  of your plate to include carbohydrate foods Whole grains, whole wheat bread, brown rice, whole grain pasta, polenta, corn tortillas, fruit, or starchy vegetables (potatoes, green peas, corn, beans, acorn squash, and butternut squash). One cup of milk also counts as a food that contains carbohydrate.  Section 4: Beverage Choose water or a low-calorie drink for your beverage. Unsweetened tea, coffee, or flavored/sparkling water without added sugar.  Image reprinted with permission from The American Diabetes Association.  Copyright 2022 by the American Diabetes Association.   Copyright 2022  Academy of Nutrition and Dietetics. All rights reserved

## 2024-05-02 NOTE — Plan of Care (Signed)
" °  Problem: Education: Goal: Ability to describe self-care measures that may prevent or decrease complications (Diabetes Survival Skills Education) will improve Outcome: Progressing   Problem: Coping: Goal: Ability to adjust to condition or change in health will improve Outcome: Progressing   Problem: Fluid Volume: Goal: Ability to maintain a balanced intake and output will improve Outcome: Progressing   Problem: Skin Integrity: Goal: Risk for impaired skin integrity will decrease Outcome: Progressing   Problem: Health Behavior/Discharge Planning: Goal: Ability to manage health-related needs will improve Outcome: Progressing   Problem: Elimination: Goal: Will not experience complications related to bowel motility Outcome: Progressing   Problem: Pain Managment: Goal: General experience of comfort will improve and/or be controlled Outcome: Progressing   Problem: Safety: Goal: Ability to remain free from injury will improve Outcome: Progressing   "

## 2024-05-02 NOTE — Progress Notes (Signed)
 " PROGRESS NOTE  Sheila [name]  FMW:[id number] DOB: [birth year] DOA: 04/30/2024 PCP: [name], MD   Brief Narrative: Patient is a 74 year old female with history of diabetes type 2, chronic leukocytosis with baseline WBC count of 14-18 who presented with left hand redness.  Patient was hit by her dog on the dorsal surface of the left hand between the 1st and 2nd digits by her own dog at the end of December.  She took doxycycline  but the hand continued to be erythematous, edematous, painful .  Noticed diminished movement of the fingers, also reported subjective fever, chills.  History of diabetes type 2 with recent A1c of 8.5.  On presentation, she was afebrile.  Lab work showed potassium of 3, creatinine of 1.5, WC count of 16.4, initial lactic acid normal.  Hand surgery consulted.  Patient admitted for the management of left hand cellulitis.  Started on broad spectrum antibiotics.  MRI of the left hand and wrist did not show any abscess or underlying collection or signs of osteomyelitis or septic arthritis..  Orthopedics closely following, plan for CT of the Left hand and wrist  Assessment & Plan:  Principal Problem:   Cellulitis of left hand Active Problems:   DM2 (diabetes mellitus, type 2) (HCC)   Leukocytosis   Hypokalemia   Cellulitis of finger of left hand  Cellulitis of the left hand:.  Patient was licked by the dog and also hit on the dorsal surface of the left hand between the 1st and 2nd digits by her own dog at the end of December.  She took doxycycline  but the hand continued to be erythematous, edematous, painful .  Noticed diminished movement of the fingers, also reported subjective fever, chills.  Hand surgery following.   Currently n.p.o. for possible need of I&D.  Continue current antibiotic.  Follow-up cultures.  Continue gentle IV fluids. MRI of the hand and wrist today showed nonspecific generalized subcutaneous edema, greatest posteriorly.No organized fluid collection  identified on limited non-contrast axial imaging.No evidence of osteomyelitis or septic arthritis. She has worsening of leukocytosis today but afebrile.  Clinically better.  Orthopedics recommended CT left hand/wrist.  Chronic leukocytosis: Baseline WBC count of 14-18 K.  Leukocytosis worsened today.  Continue monitoring  Hypokalemia: Supplemented potassium and corrected  Diabetes type 2: Takes insulin  at home.  Recent A1c 8.5.  Currently on sliding scale  CKD stage IIIa: Baseline creatinine around 1.3.  Currently kidney present close to baseline.  Obesity: BMI of 34.9      DVT prophylaxis:enoxaparin  (LOVENOX ) injection 40 mg Start: 05/02/24 1315 SCDs Start: 04/30/24 2306     Code Status: Full Code  Family Communication: Non at bedside  Patient status:obs  Patient is from :home  Anticipated discharge un:ynfz  Estimated DC date:1-2 days   Consultants: Hand surgery  Procedures:None yet  Antimicrobials:  Anti-infectives (From admission, onward)    Start     Dose/Rate Route Frequency Ordered Stop   05/02/24 2300  vancomycin  (VANCOCIN ) IVPB 1000 mg/200 mL premix  Status:  Discontinued        1,000 mg 200 mL/hr over 60 Minutes Intravenous Every 48 hours 04/30/24 2315 05/01/24 1004   05/02/24 2300  vancomycin  (VANCOREADY) IVPB 1500 mg/300 mL        1,500 mg 150 mL/hr over 120 Minutes Intravenous Every 48 hours 05/01/24 1004     04/30/24 2330  vancomycin  (VANCOREADY) IVPB 1250 mg/250 mL        1,250 mg 166.7 mL/hr over 90 Minutes Intravenous  Once 04/30/24 2315 05/01/24 0241   04/30/24 2315  Ampicillin -Sulbactam (UNASYN ) 3 g in sodium chloride  0.9 % 100 mL IVPB        3 g 200 mL/hr over 30 Minutes Intravenous Every 8 hours 04/30/24 2313     04/30/24 1815  Ampicillin -Sulbactam (UNASYN ) 3 g in sodium chloride  0.9 % 100 mL IVPB        3 g 200 mL/hr over 30 Minutes Intravenous  Once 04/30/24 1814 04/30/24 1921       Subjective: Patient seen and examined at bedside  today.  Hemodynamically stable.  Feels better today.  Still has mild edema of the left hand with erythema.  She says the pain is better today.  She is afebrile.  Objective: Vitals:   05/01/24 1500 05/01/24 2010 05/02/24 0522 05/02/24 0805  BP: (!) (P) 146/73 (!) 150/47 (!) 113/55 138/60  Pulse: (P) 86 96 100 79  Resp: (P) 18 18 18 17   Temp: (P) 98.9 F (37.2 C) 100 F (37.8 C) 98.2 F (36.8 C) 98 F (36.7 C)  TempSrc: (P) Oral Oral Oral Oral  SpO2:  99% 100% 99%  Weight: (P) 86.7 kg     Height: (P) 5' (1.524 m)       Intake/Output Summary (Last 24 hours) at 05/02/2024 1231 Last data filed at 05/01/2024 1547 Gross per 24 hour  Intake 325.63 ml  Output --  Net 325.63 ml   Filed Weights   04/30/24 1627 05/01/24 1500  Weight: 81.2 kg (P) 86.7 kg    Examination:   General exam: Overall comfortable, not in distress,obese HEENT: PERRL Respiratory system:  no wheezes or crackles  Cardiovascular system: S1 & S2 heard, RRR.  Gastrointestinal system: Abdomen is nondistended, soft and nontender. Central nervous system: Alert and oriented Extremities: No edema, no clubbing ,no cyanosis, erythema/edema/tenderness of the left hand Skin: No rashes, no ulcers,no icterus     Data Reviewed: I have personally reviewed following labs and imaging studies  CBC: Recent Labs  Lab 04/30/24 1640 05/01/24 0050 05/02/24 0537  WBC 16.4* 18.4* 22.1*  NEUTROABS 11.8* 12.8*  --   HGB 12.8 11.7* 11.1*  HCT 41.5 37.3 35.2*  MCV 90.8 90.3 90.5  PLT 503* 445* 403*   Basic Metabolic Panel: Recent Labs  Lab 04/30/24 1640 05/01/24 0050 05/02/24 0537  NA 137 138 135  K 3.0* 3.3* 3.8  CL 97* 99 101  CO2 23 22 19*  GLUCOSE 156* 144* 166*  BUN 40* 40* 37*  CREATININE 1.51* 1.37* 1.36*  CALCIUM 10.2 9.7 9.6  MG 1.9  --   --      Recent Results (from the past 240 hours)  Blood Culture (routine x 2)     Status: None (Preliminary result)   Collection Time: 04/30/24  4:45 PM   Specimen:  BLOOD RIGHT HAND  Result Value Ref Range Status   Specimen Description BLOOD RIGHT HAND  Final   Special Requests   Final    BOTTLES DRAWN AEROBIC AND ANAEROBIC Blood Culture adequate volume   Culture   Final    NO GROWTH 2 DAYS Performed at [facility], [address], [place], [place] [address]    Report Status PENDING  Incomplete  Blood Culture (routine x 2)     Status: None (Preliminary result)   Collection Time: 04/30/24  5:01 PM   Specimen: BLOOD  Result Value Ref Range Status   Specimen Description BLOOD SITE NOT SPECIFIED  Final  Special Requests   Final    BOTTLES DRAWN AEROBIC ONLY Blood Culture results may not be optimal due to an inadequate volume of blood received in culture bottles   Culture   Final    NO GROWTH 2 DAYS Performed at [facility], [address] [place], [place] [address]    Report Status PENDING  Incomplete     Radiology Studies: MR HAND LEFT WO CONTRAST Result Date: 05/01/2024 CLINICAL DATA:  Soft tissue infection suspected. History of dog bite. EXAM: MRI OF THE LEFT HAND WITHOUT CONTRAST TECHNIQUE: Multiplanar, multisequence MR imaging of the left hand was attempted. Patient was unable to complete the examination. Only axial noncontrast imaging was performed. This results in a limited study. No intravenous contrast was administered. COMPARISON:  Radiographs of the left hand 04/30/2024 and left wrist 04/27/2024. FINDINGS: Bones/Joint/Cartilage No evidence of acute fracture, dislocation or bone destruction on axial imaging. In correlation with prior radiographs, posttraumatic deformity of the distal radius with scattered degenerative subchondral cysts in the proximal carpal row. Nonspecific small to moderate radiocarpal and distal radioulnar joint effusions. Ligaments Suboptimally evaluated on axial images. Muscles and Tendons The flexor and extensor tendons appear grossly intact, without significant tenosynovitis. Mild nonspecific T2  hyperintensity within the thumb flexor musculature. Soft tissues Nonspecific generalized subcutaneous edema, greatest posteriorly. No organized fluid collection identified on limited non-contrast axial imaging. IMPRESSION: 1. Limited examination due to patient's inability to complete the examination. Only axial noncontrast imaging was obtained. Consider further evaluation with repeat MRI or CT. 2. Nonspecific generalized subcutaneous edema, greatest posteriorly. No organized fluid collection identified on limited non-contrast axial imaging. 3. No evidence of osteomyelitis or septic arthritis. 4. Nonspecific radiocarpal and distal radioulnar joint effusions. 5. Posttraumatic deformity of the distal radius with scattered degenerative subchondral cysts in the proximal carpal row. Electronically Signed   By: Elsie Perone M.D.   On: 05/01/2024 10:30   DG Hand Complete Left Result Date: 04/30/2024 EXAM: 3 OR MORE VIEW(S) XRAY OF THE LEFT HAND 04/30/2024 05:26:00 PM COMPARISON: None available. CLINICAL HISTORY: dog bite infection FINDINGS: BONES AND JOINTS: Old fracture deformity of the distal radius with mild residual dorsal tilt of the distal radial articular surface. Mild osteoarthritis of the radiocarpal joint. No malalignment. SOFT TISSUES: Soft tissue swelling of the hand and wrist. IMPRESSION: 1. Soft tissue swelling of the hand and wrist. 2. Old fracture deformity of the distal radius with mild residual dorsal tilt of the distal radial articular surface. Electronically signed by: Dorethia Molt MD MD 04/30/2024 06:28 PM EST RP Workstation: [id number]    Scheduled Meds:  enoxaparin  (LOVENOX ) injection  40 mg Subcutaneous Q24H   insulin  aspart  0-5 Units Subcutaneous QHS   insulin  aspart  0-9 Units Subcutaneous TID WC   insulin  glargine-yfgn  20 Units Subcutaneous Q2200   Continuous Infusions:  ampicillin -sulbactam (UNASYN ) IV 3 g (05/02/24 0854)   vancomycin        LOS: 1 day   Ivonne Mustache,  MD [facility] P1/14/2026, 12:31 PM  "

## 2024-05-02 NOTE — Progress Notes (Signed)
 CT with contrast left hand and wrist reveal no drainable fluid collections Recommend continuing to treat cellulitis No indication for surgical intervention currently Encourage finger and wrist range of motion and elevation

## 2024-05-02 NOTE — Progress Notes (Signed)
" °   05/02/24 1007  SDOH Interventions  Social Connections Interventions Inpatient TOC;Community Resources Provided    "

## 2024-05-02 NOTE — Progress Notes (Signed)
 Pt refusing safety measures including bed alarm, use of non skid socks and front wheel walker. Writer attempted multiple times throughout shift to educate patient on importance of safety measures including use of bed alarm with patient becoming agitated, argumentative and saying I will just fall on the floor. Charge nurse notified.   Patient became tearful around 0030 and stated I am just so anxious, when writer attempted to administer the one time dose of anxiety medication ordered by Dr Charlton, patient rolled her eyes at writer and said of course you already told everyone including the doctor about it and refused the medication for anxiety despite education.

## 2024-05-03 DIAGNOSIS — L03114 Cellulitis of left upper limb: Secondary | ICD-10-CM | POA: Diagnosis not present

## 2024-05-03 LAB — BASIC METABOLIC PANEL WITH GFR
Anion gap: 13 (ref 5–15)
BUN: 34 mg/dL — ABNORMAL HIGH (ref 8–23)
CO2: 21 mmol/L — ABNORMAL LOW (ref 22–32)
Calcium: 9 mg/dL (ref 8.9–10.3)
Chloride: 105 mmol/L (ref 98–111)
Creatinine, Ser: 1.41 mg/dL — ABNORMAL HIGH (ref 0.44–1.00)
GFR, Estimated: 40 mL/min — ABNORMAL LOW
Glucose, Bld: 114 mg/dL — ABNORMAL HIGH (ref 70–99)
Potassium: 3.3 mmol/L — ABNORMAL LOW (ref 3.5–5.1)
Sodium: 138 mmol/L (ref 135–145)

## 2024-05-03 LAB — GLUCOSE, CAPILLARY
Glucose-Capillary: 106 mg/dL — ABNORMAL HIGH (ref 70–99)
Glucose-Capillary: 115 mg/dL — ABNORMAL HIGH (ref 70–99)
Glucose-Capillary: 150 mg/dL — ABNORMAL HIGH (ref 70–99)
Glucose-Capillary: 123 mg/dL — ABNORMAL HIGH (ref 70–99)

## 2024-05-03 LAB — CBC
HCT: 33 % — ABNORMAL LOW (ref 36.0–46.0)
Hemoglobin: 10.4 g/dL — ABNORMAL LOW (ref 12.0–15.0)
MCH: 28.1 pg (ref 26.0–34.0)
MCHC: 31.5 g/dL (ref 30.0–36.0)
MCV: 89.2 fL (ref 80.0–100.0)
Platelets: 367 K/uL (ref 150–400)
RBC: 3.7 MIL/uL — ABNORMAL LOW (ref 3.87–5.11)
RDW: 14.6 % (ref 11.5–15.5)
WBC: 19.1 K/uL — ABNORMAL HIGH (ref 4.0–10.5)
nRBC: 0 % (ref 0.0–0.2)

## 2024-05-03 MED ORDER — VANCOMYCIN HCL IN DEXTROSE 1-5 GM/200ML-% IV SOLN: 1000.0000 mg | INTRAVENOUS | Status: DC

## 2024-05-03 MED ORDER — FUROSEMIDE 40 MG PO TABS
40.0000 mg | ORAL_TABLET | Freq: Two times a day (BID) | ORAL | Status: DC
  Administered 2024-05-03 – 2024-05-07 (×9): 40 mg via ORAL
  Filled 2024-05-03 (×9): qty 1

## 2024-05-03 MED ORDER — TRAMADOL HCL 50 MG PO TABS
50.0000 mg | ORAL_TABLET | Freq: Four times a day (QID) | ORAL | Status: DC | PRN
  Administered 2024-05-03 – 2024-05-04 (×4): 50 mg via ORAL
  Filled 2024-05-03 (×4): qty 1

## 2024-05-03 MED ORDER — POTASSIUM CHLORIDE CRYS ER 20 MEQ PO TBCR
40.0000 meq | EXTENDED_RELEASE_TABLET | Freq: Once | ORAL | Status: AC
  Administered 2024-05-03: 40 meq via ORAL
  Filled 2024-05-03: qty 2

## 2024-05-03 MED ORDER — LIDOCAINE 5 % EX PTCH
1.0000 | MEDICATED_PATCH | Freq: Every day | CUTANEOUS | Status: DC
  Administered 2024-05-03 – 2024-05-07 (×4): 1 via TRANSDERMAL
  Filled 2024-05-03 (×5): qty 1

## 2024-05-03 NOTE — Progress Notes (Signed)
 " PROGRESS NOTE  Sheila [name]  FMW:[id number] DOB: Jan 06, 1951 DOA: 04/30/2024 PCP: [name], MD   Brief Narrative: Patient is a 74 year old female with history of diabetes type 2, chronic leukocytosis with baseline WBC count of 14-18 who presented with left hand redness.  Patient was hit by her dog on the dorsal surface of the left hand between the 1st and 2nd digits by her own dog at the end of December.  She took doxycycline  but the hand continued to be erythematous, edematous, painful .  Noticed diminished movement of the fingers, also reported subjective fever, chills.  History of diabetes type 2 with recent A1c of 8.5.  On presentation, she was afebrile.  Lab work showed potassium of 3, creatinine of 1.5, WC count of 16.4, initial lactic acid normal.  Hand surgery consulted.  Patient admitted for the management of left hand cellulitis.  Started on broad spectrum antibiotics.  MRI of the left hand and wrist did not show any abscess or underlying collection or signs of osteomyelitis or septic arthritis..  Orthopedics consulted.  CT hand/wrist did not show any underlying abscess.  Cellulitis  getting better.  Awaiting for PT consultation  Assessment & Plan:  Principal Problem:   Cellulitis of left hand Active Problems:   DM2 (diabetes mellitus, type 2) (HCC)   Leukocytosis   Hypokalemia   Cellulitis of finger of left hand  Cellulitis of the left hand:.  Patient was licked by the dog and also hit on the dorsal surface of the left hand between the 1st and 2nd digits by her own dog at the end of December.  She took doxycycline  but the hand continued to be erythematous, edematous, painful .  Noticed diminished movement of the fingers, also reported subjective fever, chills.  Hand surgery following.   Currently n.p.o. for possible need of I&D.  Continue current antibiotic.  Follow-up cultures.  Continue gentle IV fluids. MRI of the hand and wrist today showed nonspecific generalized subcutaneous  edema, greatest posteriorly.No organized fluid collection identified on limited non-contrast axial imaging.No evidence of osteomyelitis or septic arthritis.  Orthopedics recommended CT left hand/wrist: Did not show any underlying abscess or collection. And looks much better today, less edematous and erythematous.  Leukocytosis slightly improved today  Chronic leukocytosis: Baseline WBC count of 14-18 K.  She has chronic leukocytosis.  She has been referred to hematology at [facility] by her PCP in the past.  Will recommend to follow-up with hematology as an outpatient.  Hypokalemia: Supplemented potassium   Diabetes type 2: Takes insulin  at home.  Recent A1c 8.5.  Currently on sliding scale  CKD stage IIIa: Baseline creatinine around 1.3.  Currently kidney present close to baseline.  Takes Lasix  40 mg twice daily at home.  Obesity: BMI of 34.9  Diabetes/deconditioning: Patient has severe bilateral knee osteoarthritis.  Order lidocaine  patch for nephrology.  Continue home tramadol .  PT consulted      DVT prophylaxis:enoxaparin  (LOVENOX ) injection 40 mg Start: 05/02/24 1400 SCDs Start: 04/30/24 2306     Code Status: Full Code  Family Communication: Non at bedside  Patient status: Inpatient  Patient is from :home  Anticipated discharge un:ynfz  Estimated DC date:1-2 days, likely tomorrow   Consultants: Hand surgery  Procedures:None yet  Antimicrobials:  Anti-infectives (From admission, onward)    Start     Dose/Rate Route Frequency Ordered Stop   05/04/24 2300  vancomycin  (VANCOCIN ) IVPB 1000 mg/200 mL premix        1,000 mg 200  mL/hr over 60 Minutes Intravenous Every 48 hours 05/03/24 0845     05/02/24 2300  vancomycin  (VANCOCIN ) IVPB 1000 mg/200 mL premix  Status:  Discontinued        1,000 mg 200 mL/hr over 60 Minutes Intravenous Every 48 hours 04/30/24 2315 05/01/24 1004   05/02/24 2300  vancomycin  (VANCOREADY) IVPB 1500 mg/300 mL  Status:  Discontinued        1,500  mg 150 mL/hr over 120 Minutes Intravenous Every 48 hours 05/01/24 1004 05/03/24 0845   04/30/24 2330  vancomycin  (VANCOREADY) IVPB 1250 mg/250 mL        1,250 mg 166.7 mL/hr over 90 Minutes Intravenous  Once 04/30/24 2315 05/01/24 0241   04/30/24 2315  Ampicillin -Sulbactam (UNASYN ) 3 g in sodium chloride  0.9 % 100 mL IVPB        3 g 200 mL/hr over 30 Minutes Intravenous Every 8 hours 04/30/24 2313     04/30/24 1815  Ampicillin -Sulbactam (UNASYN ) 3 g in sodium chloride  0.9 % 100 mL IVPB        3 g 200 mL/hr over 30 Minutes Intravenous  Once 04/30/24 1814 04/30/24 1921       Subjective: Patient seen and examined at bedside today.  Left hand looks much better today.  Less erythematous and edematous.  Pain much better.  She is afebrile.  Leukocytosis also improved.  She complains of bilateral knee pain today.  Bilateral knee not erythematous or tender.  We discussed about PT consultation, lidocaine  patch  Objective: Vitals:   05/03/24 0447 05/03/24 0743 05/03/24 0745 05/03/24 0832  BP: 111/63 (!) 144/64 (!) 144/64   Pulse: 77 79 79   Resp: 20 20    Temp: 98.1 F (36.7 C) 98.1 F (36.7 C) 98 F (36.7 C)   TempSrc: Oral Oral Oral   SpO2: 100% 100% 100%   Weight: 88 kg   84.6 kg  Height:        Intake/Output Summary (Last 24 hours) at 05/03/2024 1105 Last data filed at 05/03/2024 0300 Gross per 24 hour  Intake 1737.44 ml  Output --  Net 1737.44 ml   Filed Weights   05/01/24 1500 05/03/24 0447 05/03/24 0832  Weight: (P) 86.7 kg 88 kg 84.6 kg    Examination:   General exam: Overall comfortable, not in distress,obese HEENT: PERRL Respiratory system:  no wheezes or crackles  Cardiovascular system: S1 & S2 heard, RRR.  Gastrointestinal system: Abdomen is nondistended, soft and nontender. Central nervous system: Alert and oriented Extremities: No edema, no clubbing ,no cyanosis, improving cellulitis of the left hand Skin: No rashes, no ulcers,no icterus     Data Reviewed:  I have personally reviewed following labs and imaging studies  CBC: Recent Labs  Lab 04/30/24 1640 05/01/24 0050 05/02/24 0537 05/03/24 0453  WBC 16.4* 18.4* 22.1* 19.1*  NEUTROABS 11.8* 12.8*  --   --   HGB 12.8 11.7* 11.1* 10.4*  HCT 41.5 37.3 35.2* 33.0*  MCV 90.8 90.3 90.5 89.2  PLT 503* 445* 403* 367   Basic Metabolic Panel: Recent Labs  Lab 04/30/24 1640 05/01/24 0050 05/02/24 0537 05/03/24 0453  NA 137 138 135 138  K 3.0* 3.3* 3.8 3.3*  CL 97* 99 101 105  CO2 23 22 19* 21*  GLUCOSE 156* 144* 166* 114*  BUN 40* 40* 37* 34*  CREATININE 1.51* 1.37* 1.36* 1.41*  CALCIUM 10.2 9.7 9.6 9.0  MG 1.9  --   --   --  Recent Results (from the past 240 hours)  Blood Culture (routine x 2)     Status: None (Preliminary result)   Collection Time: 04/30/24  4:45 PM   Specimen: BLOOD RIGHT HAND  Result Value Ref Range Status   Specimen Description BLOOD RIGHT HAND  Final   Special Requests   Final    BOTTLES DRAWN AEROBIC AND ANAEROBIC Blood Culture adequate volume   Culture   Final    NO GROWTH 3 DAYS Performed at [facility], [address] [place], [place] [address]    Report Status PENDING  Incomplete  Blood Culture (routine x 2)     Status: None (Preliminary result)   Collection Time: 04/30/24  5:01 PM   Specimen: BLOOD  Result Value Ref Range Status   Specimen Description BLOOD SITE NOT SPECIFIED  Final   Special Requests   Final    BOTTLES DRAWN AEROBIC ONLY Blood Culture results may not be optimal due to an inadequate volume of blood received in culture bottles   Culture   Final    NO GROWTH 3 DAYS Performed at [facility], [address] [place], [place] [address]    Report Status PENDING  Incomplete     Radiology Studies: CT WRIST LEFT W CONTRAST Result Date: 05/02/2024 EXAM: CT LEFT WRIST, WITH IV CONTRAST 05/02/2024 02:31:07 PM TECHNIQUE: Axial images were acquired through the left wrist with IV contrast. Reformatted images were  reviewed. Automated exposure control, iterative reconstruction, and/or weight based adjustment of the mA/kV was utilized to reduce the radiation dose to as low as reasonably achievable. COMPARISON: None provided. CLINICAL HISTORY: Soft tissue infection in the hand/wrist, dog bite. FINDINGS: LIMITATIONS/ARTIFACTS: Motion artifact is present, reducing diagnostic sensitivity and specificity. BONES: Mild degenerative findings of the radiocarpal articulation with degenerative subcortical cysts along the distal radial articular surface. Small geodes in the carpus including the lunate. No acute fracture or focal osseous lesion. JOINTS: No malalignment. No dislocation. The joint spaces are normal. SOFT TISSUES: No drainable abscess identified. Trace subcutaneous edema dorsal to the proximal hand. No abnormal gas in the soft tissues. IMPRESSION: 1. Trace subcutaneous edema dorsal to the proximal hand, without soft tissue gas or drainable abscess. 2. No acute osseous abnormality or malalignment. 3. Mild degenerative change of the radiocarpal articulation with degenerative subcortical cysts along the distal radial articular surface and lunate. Electronically signed by: Ryan Salvage MD 05/02/2024 03:28 PM EST RP Workstation: [id number]   CT HAND LEFT W CONTRAST Result Date: 05/02/2024 EXAM: CT left hand, with IV contrast TECHNIQUE: Axial images were acquired through the left hand with IV contrast. Reformatted images were reviewed. Automated exposure control, iterative reconstruction, and/or weight based adjustment of the mA/kV was utilized to reduce the radiation dose to as low as reasonably achievable. Motion artifact is present, reducing diagnostic sensitivity and specificity. COMPARISON: None available. CLINICAL HISTORY: Refractory cellulitis of the left hand despite oral antibiotics; original dog bite along the dorsal left hand between the 1st and 2nd digits in late December 2025; diminished range of motion and  pain. FINDINGS: BONES AND JOINTS: Mild degenerative findings at the radiocarpal articulation including spurring and mild degenerative subcortical cyst formation in the distal radius. No bony destructive findings characteristic of osteomyelitis. No acute fracture or focal osseous lesion. No dislocation. The joint spaces are normal. SOFT TISSUES: No soft tissue abscess or gas tracking in the soft tissues identified. Low grade dorsal subcutaneous edema along the dorsum of the hand potentially reflecting cellulitis  but not striking in degree. IMPRESSION: 1. No soft tissue abscess or soft tissue gas, and no CT findings of osteomyelitis. 2. Low-grade dorsal subcutaneous edema along the dorsum of the hand, compatible with clinically evidence cellulitis. 3. Motion artifact, reducing diagnostic sensitivity and specificity. 4. Mild degenerative change at the radiocarpal articulation, including spurring and mild degenerative subcortical cyst formation in the distal radius. Electronically signed by: Ryan Salvage MD 05/02/2024 02:54 PM EST RP Workstation: [id number]    Scheduled Meds:  enoxaparin  (LOVENOX ) injection  40 mg Subcutaneous Q24H   furosemide   40 mg Oral BID   insulin  aspart  0-5 Units Subcutaneous QHS   insulin  aspart  0-9 Units Subcutaneous TID WC   insulin  glargine-yfgn  20 Units Subcutaneous Q2200   lidocaine   1 patch Transdermal Daily   polyethylene glycol  17 g Oral Daily   senna-docusate  1 tablet Oral BID   Continuous Infusions:  ampicillin -sulbactam (UNASYN ) IV 3 g (05/03/24 0846)   [START ON 05/04/2024] vancomycin        LOS: 2 days   Ivonne Mustache, MD [facility] P1/15/2026, 11:05 AM  "

## 2024-05-03 NOTE — Plan of Care (Signed)
" °  Problem: Education: Goal: Ability to describe self-care measures that may prevent or decrease complications (Diabetes Survival Skills Education) will improve Outcome: Progressing Goal: Individualized Educational Video(s) Outcome: Progressing   Problem: Activity: Goal: Risk for activity intolerance will decrease Outcome: Progressing   Problem: Pain Managment: Goal: General experience of comfort will improve and/or be controlled Outcome: Progressing   Problem: Skin Integrity: Goal: Risk for impaired skin integrity will decrease Outcome: Progressing   "

## 2024-05-03 NOTE — Progress Notes (Signed)
 PHARMACY NOTE:  ANTIMICROBIAL RENAL DOSAGE ADJUSTMENT  Current antimicrobial regimen includes a mismatch between antimicrobial dosage and estimated renal function.  As per policy approved by the Pharmacy & Therapeutics and Medical Executive Committees, the antimicrobial dosage will be adjusted accordingly.  Current antimicrobial dosage:  Vancomycin  1500mg  IV q 48h (eAUC 650; SCr used 1.41; Vdcoeff 0.5)  Indication: cellulitis s/p dog bite  Renal Function:  Estimated Creatinine Clearance: 36.1 mL/min (A) (by C-G formula based on SCr of 1.41 mg/dL (H)). []      On intermittent HD, scheduled: []      On CRRT    Antimicrobial dosage has been changed to:  vanco 1000mg  q48h (eAUC 433)  Additional comments:   Thank you for allowing pharmacy to be a part of this patient's care.  Harlene Boga, PharmD Please refer to [facility] for [facility] numbers 05/03/2024 8:47 AM

## 2024-05-03 NOTE — Plan of Care (Signed)
" °  Problem: Metabolic: Goal: Ability to maintain appropriate glucose levels will improve Outcome: Progressing   Problem: Nutritional: Goal: Maintenance of adequate nutrition will improve Outcome: Progressing   Problem: Coping: Goal: Level of anxiety will decrease Outcome: Progressing   Problem: Elimination: Goal: Will not experience complications related to bowel motility Outcome: Progressing Goal: Will not experience complications related to urinary retention Outcome: Progressing   Problem: Pain Managment: Goal: General experience of comfort will improve and/or be controlled Outcome: Progressing   Problem: Safety: Goal: Ability to remain free from injury will improve Outcome: Progressing   Problem: Skin Integrity: Goal: Risk for impaired skin integrity will decrease Outcome: Progressing   Problem: Activity: Goal: Risk for activity intolerance will decrease Outcome: Not Progressing   "

## 2024-05-04 ENCOUNTER — Inpatient Hospital Stay (HOSPITAL_COMMUNITY)

## 2024-05-04 LAB — CBC
HCT: 34 % — ABNORMAL LOW (ref 36.0–46.0)
Hemoglobin: 10.8 g/dL — ABNORMAL LOW (ref 12.0–15.0)
MCH: 28.3 pg (ref 26.0–34.0)
MCHC: 31.8 g/dL (ref 30.0–36.0)
MCV: 89 fL (ref 80.0–100.0)
Platelets: 396 K/uL (ref 150–400)
RBC: 3.82 MIL/uL — ABNORMAL LOW (ref 3.87–5.11)
RDW: 14.7 % (ref 11.5–15.5)
WBC: 19.9 K/uL — ABNORMAL HIGH (ref 4.0–10.5)
nRBC: 0 % (ref 0.0–0.2)

## 2024-05-04 LAB — GLUCOSE, CAPILLARY
Glucose-Capillary: 118 mg/dL — ABNORMAL HIGH (ref 70–99)
Glucose-Capillary: 96 mg/dL (ref 70–99)
Glucose-Capillary: 116 mg/dL — ABNORMAL HIGH (ref 70–99)
Glucose-Capillary: 199 mg/dL — ABNORMAL HIGH (ref 70–99)

## 2024-05-04 LAB — BASIC METABOLIC PANEL WITH GFR
Anion gap: 12 (ref 5–15)
BUN: 26 mg/dL — ABNORMAL HIGH (ref 8–23)
CO2: 21 mmol/L — ABNORMAL LOW (ref 22–32)
Calcium: 8.7 mg/dL — ABNORMAL LOW (ref 8.9–10.3)
Chloride: 104 mmol/L (ref 98–111)
Creatinine, Ser: 1.12 mg/dL — ABNORMAL HIGH (ref 0.44–1.00)
GFR, Estimated: 52 mL/min — ABNORMAL LOW
Glucose, Bld: 69 mg/dL — ABNORMAL LOW (ref 70–99)
Potassium: 3.4 mmol/L — ABNORMAL LOW (ref 3.5–5.1)
Sodium: 137 mmol/L (ref 135–145)

## 2024-05-04 LAB — URIC ACID: Uric Acid, Serum: 8.1 mg/dL — ABNORMAL HIGH (ref 2.5–7.1)

## 2024-05-04 MED ORDER — VANCOMYCIN HCL 1250 MG/250ML IV SOLN: 1250.0000 mg | INTRAVENOUS | Status: DC

## 2024-05-04 MED ORDER — POTASSIUM CHLORIDE CRYS ER 20 MEQ PO TBCR
40.0000 meq | EXTENDED_RELEASE_TABLET | Freq: Once | ORAL | Status: AC
  Administered 2024-05-04: 40 meq via ORAL
  Filled 2024-05-04: qty 2

## 2024-05-04 MED ORDER — DOXYCYCLINE HYCLATE 100 MG PO TABS
100.0000 mg | ORAL_TABLET | Freq: Two times a day (BID) | ORAL | Status: DC
  Administered 2024-05-04 – 2024-05-07 (×7): 100 mg via ORAL
  Filled 2024-05-04 (×7): qty 1

## 2024-05-04 MED ORDER — AMOXICILLIN-POT CLAVULANATE 875-125 MG PO TABS
1.0000 | ORAL_TABLET | Freq: Two times a day (BID) | ORAL | Status: DC
  Administered 2024-05-04 – 2024-05-07 (×7): 1 via ORAL
  Filled 2024-05-04 (×7): qty 1

## 2024-05-04 MED ORDER — COLCHICINE 0.6 MG PO TABS
0.6000 mg | ORAL_TABLET | Freq: Every day | ORAL | Status: DC
  Filled 2024-05-04: qty 1

## 2024-05-04 MED ORDER — COLCHICINE 0.6 MG PO TABS
0.6000 mg | ORAL_TABLET | Freq: Two times a day (BID) | ORAL | Status: DC
  Administered 2024-05-04 – 2024-05-07 (×7): 0.6 mg via ORAL
  Filled 2024-05-04 (×8): qty 1

## 2024-05-04 MED ORDER — OXYCODONE HCL 5 MG PO TABS
7.5000 mg | ORAL_TABLET | ORAL | Status: DC | PRN
  Administered 2024-05-07: 7.5 mg via ORAL
  Filled 2024-05-04: qty 2

## 2024-05-04 NOTE — Care Management Important Message (Signed)
 Important Message  Patient Details  Name: Sheila [name] MRN: [id number] Date of Birth: [birth year]   Important Message Given:  Yes - Medicare IM     Claretta Deed 05/04/2024, 2:47 PM

## 2024-05-04 NOTE — Inpatient Diabetes Management (Signed)
 Inpatient Diabetes Program Recommendations  AACE/ADA: New Consensus Statement on Inpatient Glycemic Control   Target Ranges:  Prepandial:   less than 140 mg/dL      Peak postprandial:   less than 180 mg/dL (1-2 hours)      Critically ill patients:  140 - 180 mg/dL   Lab Results  Component Value Date   GLUCAP 118 (H) 05/04/2024   HGBA1C 8.9 (H) 04/30/2024    Latest Reference Range & Units 05/03/24 08:00 05/03/24 11:56 05/03/24 15:54 05/03/24 20:08 05/04/24 08:50  Glucose-Capillary 70 - 99 mg/dL 893 (H) 884 (H) 849 (H) 123 (H) 118 (H)    Latest Reference Range & Units Most Recent  Glucose 70 - 99 mg/dL 69 (L) 8/83/73 94:94  Hemoglobin A1C 4.8 - 5.6 % 8.9 (H) 04/30/24 16:40   Review of Glycemic Control  Diabetes history: DM2  Outpatient Diabetes medications:  Tresiba 56 units at bedtime  Humalog 3-15 units TID Jardiance 25mg  daily  Current orders for Inpatient glycemic control:  Semglee  20 units daily  Novolog  0-9 units TID + 0-5 units at bedtime   Inpatient Diabetes Program Recommendations:   Noted hypoglycemia this morning (69mg /dL).   Please consider decreasing Semglee  to 18 units daily.   Thanks,  Lavanda Search, RN, MSN, [facility]  Inpatient Diabetes Coordinator  Pager [phone] (8a-5p)

## 2024-05-04 NOTE — Care Management (Signed)
 Transition of Care [facility]) - Inpatient Brief Assessment   Patient Details  Name: Sheila [name] MRN: [id number] Date of Birth: [birth year]  Transition of Care [facility]) CM/SW Contact:    Corean JAYSON Canary, RN Phone Number: 05/04/2024, 3:01 PM   Clinical Narrative:  Patient presented with a left hand dog bite, cellulitis that  failed outpatient antibiotic therapy.  She was bit by her own dog. UPD on vaccinations.  Receiving IV abx here in hospital.  PT evaluation revealed recommendation for SNF as she is having pain in her legs and decreasing mobility. Patient lives with spouse currently.  Of note, nursing notes revealed that she is agitated with the bed alarm, and has had to be re-educated several times regarding safety precautions attempted to callll [facility] Frommer to discuss recommendations and discharge planning. Left message to return call.   Will try to call back as time allows. May be more suited to Home health vs SNF. There is no HH or SNF history in [facility] will follow.    Transition of Care Asessment: Insurance and Status: Insurance coverage has been reviewed Patient has primary care physician: Yes Home environment has been reviewed: Lives with spouse Prior level of function:: DME Prior/Current Home Services: No current home services Social Drivers of Health Review: SDOH reviewed interventions complete Readmission risk has been reviewed: Yes Transition of care needs: transition of care needs identified, TOC will continue to follow

## 2024-05-04 NOTE — Evaluation (Signed)
 Physical Therapy Evaluation Patient Details Name: Sheila [name] MRN: [id number] DOB: 09/11/1950 Today's Date: 05/04/2024  History of Present Illness  Pt is a 74 y.o. female admitted 1/12 with L hand cellulitis due to dog bite. During admission, pt with c/o increasing pain L hip/knee. Suspected gout flair. PMH: DM, HTN, gout  Clinical Impression  Pt admitted with above diagnosis. PTA pt lived at home with family, mod I mobility with cane vs rollator. Pt currently with functional limitations due to the deficits listed below (see PT Problem List). On eval, pt required max assist supine <> sit and demo fair sitting balance. Mobility significantly limited by LLE pain (suspected gout flair). Unable to attempt standing due to pain. Pt will benefit from acute skilled PT to increase their independence and safety with mobility to allow discharge. Post acute, pt would benefit from further therapy in inpatient setting, <3 hours/day.         If plan is discharge home, recommend the following: Two people to help with walking and/or transfers;Two people to help with bathing/dressing/bathroom   Can travel by private vehicle   No    Equipment Recommendations Other (comment) (TBD next venue)  Recommendations for Other Services       Functional Status Assessment Patient has had a recent decline in their functional status and demonstrates the ability to make significant improvements in function in a reasonable and predictable amount of time.     Precautions / Restrictions Precautions Precautions: Fall Recall of Precautions/Restrictions: Intact      Mobility  Bed Mobility Overal bed mobility: Needs Assistance Bed Mobility: Supine to Sit, Sit to Supine     Supine to sit: Max assist, HOB elevated Sit to supine: Max assist   General bed mobility comments: minimal active assist from pt. Pt internally distracted by pain.    Transfers                   General transfer comment: unable to  attempt due to LLE pain    Ambulation/Gait                  Stairs            Wheelchair Mobility     Tilt Bed    Modified Rankin (Stroke Patients Only)       Balance Overall balance assessment: Needs assistance Sitting-balance support: Feet supported, No upper extremity supported Sitting balance-Leahy Scale: Fair                                       Pertinent Vitals/Pain Pain Assessment Pain Assessment: 0-10 Pain Score: 10-Worst pain ever Pain Location: LLE Pain Descriptors / Indicators: Guarding, Grimacing, Moaning, Tender Pain Intervention(s): Monitored during session, Limited activity within patient's tolerance, Patient requesting pain meds-RN notified, Ice applied    Home Living Family/patient expects to be discharged to:: Private residence Living Arrangements: Spouse/significant other Available Help at Discharge: Family;Available 24 hours/day Type of Home: House Home Access: Stairs to enter Entrance Stairs-Rails: Left Entrance Stairs-Number of Steps: 2   Home Layout: One level Home Equipment: Rollator (4 wheels);Rolling Walker (2 wheels);Cane - single point;Shower seat      Prior Function Prior Level of Function : Independent/Modified Independent             Mobility Comments: rollator in house, cane in community       Extremity/Trunk Assessment   Upper Extremity  Assessment Upper Extremity Assessment: LUE deficits/detail LUE Deficits / Details: L hand cellulitis, bursal swelling at elbow, pt maintaining LUE in flexed position LUE: Unable to fully assess due to pain    Lower Extremity Assessment Lower Extremity Assessment: Generalized weakness;LLE deficits/detail LLE Deficits / Details: unable to tolerate ROM, LLE resting in flexed position LLE: Unable to fully assess due to pain    Cervical / Trunk Assessment Cervical / Trunk Assessment: Kyphotic  Communication   Communication Communication: No apparent  difficulties    Cognition Arousal: Alert Behavior During Therapy: WFL for tasks assessed/performed   PT - Cognitive impairments: No apparent impairments                         Following commands: Intact       Cueing Cueing Techniques: Verbal cues, Tactile cues     General Comments      Exercises     Assessment/Plan    PT Assessment Patient needs continued PT services  PT Problem List Decreased strength;Decreased mobility;Decreased range of motion;Decreased balance;Pain;Decreased activity tolerance       PT Treatment Interventions Therapeutic exercise;Gait training;Balance training;Functional mobility training;Stair training;Therapeutic activities;Patient/family education    PT Goals (Current goals can be found in the Care Plan section)  Acute Rehab PT Goals Patient Stated Goal: home PT Goal Formulation: With patient Time For Goal Achievement: 05/18/24 Potential to Achieve Goals: Good    Frequency Min 2X/week     Co-evaluation               AM-PAC PT 6 Clicks Mobility  Outcome Measure Help needed turning from your back to your side while in a flat bed without using bedrails?: A Lot Help needed moving from lying on your back to sitting on the side of a flat bed without using bedrails?: Total Help needed moving to and from a bed to a chair (including a wheelchair)?: Total Help needed standing up from a chair using your arms (e.g., wheelchair or bedside chair)?: Total Help needed to walk in hospital room?: Total Help needed climbing 3-5 steps with a railing? : Total 6 Click Score: 7    End of Session   Activity Tolerance: Patient limited by pain Patient left: in bed;with call bell/phone within reach;with bed alarm set Nurse Communication: Mobility status;Patient requests pain meds PT Visit Diagnosis: Other abnormalities of gait and mobility (R26.89);Pain Pain - Right/Left: Left Pain - part of body: Leg    Time: 8977-8953 PT Time  Calculation (min) (ACUTE ONLY): 24 min   Charges:   PT Evaluation $PT Eval Moderate Complexity: 1 Mod   PT General Charges $$ ACUTE PT VISIT: 1 Visit         Sari MATSU., PT  Office # [phone]   Erven Sari Shaker 05/04/2024, 11:48 AM

## 2024-05-04 NOTE — Plan of Care (Signed)
" °  Problem: Coping: Goal: Ability to adjust to condition or change in health will improve Outcome: Progressing   Problem: Fluid Volume: Goal: Ability to maintain a balanced intake and output will improve Outcome: Progressing   Problem: Metabolic: Goal: Ability to maintain appropriate glucose levels will improve Outcome: Progressing   Problem: Nutritional: Goal: Maintenance of adequate nutrition will improve Outcome: Progressing Goal: Progress toward achieving an optimal weight will improve Outcome: Progressing   Problem: Skin Integrity: Goal: Risk for impaired skin integrity will decrease Outcome: Progressing   Problem: Tissue Perfusion: Goal: Adequacy of tissue perfusion will improve Outcome: Progressing   Problem: Education: Goal: Knowledge of General Education information will improve Description: Including pain rating scale, medication(s)/side effects and non-pharmacologic comfort measures Outcome: Progressing   Problem: Health Behavior/Discharge Planning: Goal: Ability to manage health-related needs will improve Outcome: Progressing   Problem: Clinical Measurements: Goal: Ability to maintain clinical measurements within normal limits will improve Outcome: Progressing Goal: Will remain free from infection Outcome: Progressing Goal: Diagnostic test results will improve Outcome: Progressing Goal: Respiratory complications will improve Outcome: Progressing Goal: Cardiovascular complication will be avoided Outcome: Progressing   Problem: Activity: Goal: Risk for activity intolerance will decrease Outcome: Progressing   Problem: Nutrition: Goal: Adequate nutrition will be maintained Outcome: Progressing   Problem: Elimination: Goal: Will not experience complications related to bowel motility Outcome: Progressing Goal: Will not experience complications related to urinary retention Outcome: Progressing   Problem: Pain Managment: Goal: General experience of  comfort will improve and/or be controlled Outcome: Progressing   Problem: Safety: Goal: Ability to remain free from injury will improve Outcome: Progressing   Problem: Skin Integrity: Goal: Risk for impaired skin integrity will decrease Outcome: Progressing   "

## 2024-05-04 NOTE — Progress Notes (Signed)
 PHARMACY NOTE:  ANTIMICROBIAL RENAL DOSAGE ADJUSTMENT  Current antimicrobial regimen includes a mismatch between antimicrobial dosage and estimated renal function.  As per policy approved by the Pharmacy & Therapeutics and Medical Executive Committees, the antimicrobial dosage will be adjusted accordingly.  Current antimicrobial dosage:  Vancomycin  1000mg  IV q 48h   Indication: cellulitis s/p dog bite  Renal Function: Improved SCr and UOP  Estimated Creatinine Clearance: 44.4 mL/min (A) (by C-G formula based on SCr of 1.12 mg/dL (H)). []      On intermittent HD, scheduled: []      On CRRT    Antimicrobial dosage has been changed to:  vanco 1250mg  q48h (eAUC 467)  Additional comments:   Thank you for allowing pharmacy to be a part of this patient's care.  Harlene Boga, PharmD Please refer to [facility] for [facility] numbers 05/04/2024 8:11 AM

## 2024-05-04 NOTE — Progress Notes (Signed)
 " PROGRESS NOTE  Sheila [name]  FMW:[id number] DOB: [birth year] DOA: 04/30/2024 PCP: [name], MD   Brief Narrative: Patient is a 74 year old female with history of diabetes type 2, chronic leukocytosis with baseline WBC count of 14-18 who presented with left hand redness.  Patient was hit by her dog on the dorsal surface of the left hand between the 1st and 2nd digits by her own dog at the end of December.  She took doxycycline  but the hand continued to be erythematous, edematous, painful .  Noticed diminished movement of the fingers, also reported subjective fever, chills.  History of diabetes type 2 with recent A1c of 8.5.  On presentation, she was afebrile.  Lab work showed potassium of 3, creatinine of 1.5, WC count of 16.4, initial lactic acid normal.  Hand surgery consulted.  Patient admitted for the management of left hand cellulitis.  Started on broad spectrum antibiotics.  MRI of the left hand and wrist did not show any abscess or underlying collection or signs of osteomyelitis or septic arthritis..  Orthopedics consulted.  CT hand/wrist did not show any underlying abscess.  Cellulitis  getting better.  Patient now complaining of severe left knee pain, left hip pain, likely from gout.  PT recommended SNF on discharge.  TOC consulted.  Assessment & Plan:  Principal Problem:   Cellulitis of left hand Active Problems:   DM2 (diabetes mellitus, type 2) (HCC)   Leukocytosis   Hypokalemia   Cellulitis of finger of left hand  Cellulitis of the left hand:.  Patient was licked by the dog and also hit on the dorsal surface of the left hand between the 1st and 2nd digits by her own dog at the end of December.  She took doxycycline  but the hand continued to be erythematous, edematous, painful .  Noticed diminished movement of the fingers, also reported subjective fever, chills.  Hand surgery consulted MRI of the hand and wrist today showed nonspecific generalized subcutaneous edema, greatest  posteriorly.No organized fluid collection identified on limited non-contrast axial imaging.No evidence of osteomyelitis or septic arthritis.  CT left hand/wrist: Did not show any underlying abscess or collection. Hand  looks much better today, less edematous and erythematous.  Antibiotic changed to Augmentin  and doxycycline , plan for 1 more week of treatment  Chronic leukocytosis: Baseline WBC count of 14-18 K.  She has chronic leukocytosis.  She has been referred to hematology at [facility] by her PCP in the past.  Will recommend to follow-up with hematology as an outpatient.  WBC count of 19,000  Left knee pain/left hip pain: New problem.  Patient unable to stand with physical therapy and complained of severe pain.  He says she has history of gout.    Uric acid level.  Started on colchicine .  Checking x-ray of the left knee, left hip  Hypokalemia: Supplemented potassium   Diabetes type 2: Takes insulin  at home.  Recent A1c 8.5.  Currently on sliding scale  CKD stage IIIa: Baseline creatinine around 1.3.  Currently kidney present close to baseline.  Takes Lasix  40 mg twice daily at home.  Restarted  Obesity: BMI of 34.9  Diabetes/deconditioning: Patient has severe bilateral knee osteoarthritis.  Order lidocaine  patch for nephrology.  Continue home tramadol .  PT consulted      DVT prophylaxis:enoxaparin  (LOVENOX ) injection 40 mg Start: 05/02/24 1400 SCDs Start: 04/30/24 2306     Code Status: Full Code  Family Communication: Non at bedside  Patient status: Inpatient  Patient is from :home  Anticipated discharge to:SNF  Estimated DC date:1-2 days   Consultants: Hand surgery  Procedures:None yet  Antimicrobials:  Anti-infectives (From admission, onward)    Start     Dose/Rate Route Frequency Ordered Stop   05/04/24 2300  vancomycin  (VANCOCIN ) IVPB 1000 mg/200 mL premix  Status:  Discontinued        1,000 mg 200 mL/hr over 60 Minutes Intravenous Every 48 hours 05/03/24 0845  05/04/24 0811   05/04/24 2300  vancomycin  (VANCOREADY) IVPB 1250 mg/250 mL  Status:  Discontinued        1,250 mg 166.7 mL/hr over 90 Minutes Intravenous Every 48 hours 05/04/24 0811 05/04/24 0949   05/04/24 1500  amoxicillin -clavulanate (AUGMENTIN ) 875-125 MG per tablet 1 tablet        1 tablet Oral Every 12 hours 05/04/24 0949     05/04/24 1045  doxycycline  (VIBRA -TABS) tablet 100 mg        100 mg Oral Every 12 hours 05/04/24 0949     05/02/24 2300  vancomycin  (VANCOCIN ) IVPB 1000 mg/200 mL premix  Status:  Discontinued        1,000 mg 200 mL/hr over 60 Minutes Intravenous Every 48 hours 04/30/24 2315 05/01/24 1004   05/02/24 2300  vancomycin  (VANCOREADY) IVPB 1500 mg/300 mL  Status:  Discontinued        1,500 mg 150 mL/hr over 120 Minutes Intravenous Every 48 hours 05/01/24 1004 05/03/24 0845   04/30/24 2330  vancomycin  (VANCOREADY) IVPB 1250 mg/250 mL        1,250 mg 166.7 mL/hr over 90 Minutes Intravenous  Once 04/30/24 2315 05/01/24 0241   04/30/24 2315  Ampicillin -Sulbactam (UNASYN ) 3 g in sodium chloride  0.9 % 100 mL IVPB  Status:  Discontinued        3 g 200 mL/hr over 30 Minutes Intravenous Every 8 hours 04/30/24 2313 05/04/24 0949   04/30/24 1815  Ampicillin -Sulbactam (UNASYN ) 3 g in sodium chloride  0.9 % 100 mL IVPB        3 g 200 mL/hr over 30 Minutes Intravenous  Once 04/30/24 1814 04/30/24 1921       Subjective: Patient seen and examined at bedside today.  Hemodynamically stable.  Left hand cellulitis looks much better.  Minimal erythema or edema.  Complains of left knee pain, left hip pain today.  Objective: Vitals:   05/03/24 1601 05/03/24 2250 05/04/24 0523 05/04/24 1046  BP: (!) 142/48 (!) 144/71 (!) 142/62 (!) 156/68  Pulse: 84 86 86 87  Resp: 16 18 20 18   Temp: 98.2 F (36.8 C) 99.3 F (37.4 C) 99.9 F (37.7 C) 98.1 F (36.7 C)  TempSrc: Oral Oral Oral Oral  SpO2: 100% 98% 99% 100%  Weight:   84.6 kg   Height:        Intake/Output Summary (Last 24  hours) at 05/04/2024 1118 Last data filed at 05/04/2024 0526 Gross per 24 hour  Intake --  Output 950 ml  Net -950 ml   Filed Weights   05/03/24 0447 05/03/24 0832 05/04/24 0523  Weight: 88 kg 84.6 kg 84.6 kg    Examination:   General exam: Lying on bed, obese HEENT: PERRL Respiratory system:  no wheezes or crackles  Cardiovascular system: S1 & S2 heard, RRR.  Gastrointestinal system: Abdomen is nondistended, soft and nontender. Central nervous system: Alert and oriented Extremities: No edema, no clubbing ,no cyanosis, improving cellulitis of the left hand Skin: No rashes, no ulcers,no icterus     Data Reviewed: I have personally reviewed  following labs and imaging studies  CBC: Recent Labs  Lab 04/30/24 1640 05/01/24 0050 05/02/24 0537 05/03/24 0453 05/04/24 0505  WBC 16.4* 18.4* 22.1* 19.1* 19.9*  NEUTROABS 11.8* 12.8*  --   --   --   HGB 12.8 11.7* 11.1* 10.4* 10.8*  HCT 41.5 37.3 35.2* 33.0* 34.0*  MCV 90.8 90.3 90.5 89.2 89.0  PLT 503* 445* 403* 367 396   Basic Metabolic Panel: Recent Labs  Lab 04/30/24 1640 05/01/24 0050 05/02/24 0537 05/03/24 0453 05/04/24 0505  NA 137 138 135 138 137  K 3.0* 3.3* 3.8 3.3* 3.4*  CL 97* 99 101 105 104  CO2 23 22 19* 21* 21*  GLUCOSE 156* 144* 166* 114* 69*  BUN 40* 40* 37* 34* 26*  CREATININE 1.51* 1.37* 1.36* 1.41* 1.12*  CALCIUM 10.2 9.7 9.6 9.0 8.7*  MG 1.9  --   --   --   --      Recent Results (from the past 240 hours)  Blood Culture (routine x 2)     Status: None (Preliminary result)   Collection Time: 04/30/24  4:45 PM   Specimen: BLOOD RIGHT HAND  Result Value Ref Range Status   Specimen Description BLOOD RIGHT HAND  Final   Special Requests   Final    BOTTLES DRAWN AEROBIC AND ANAEROBIC Blood Culture adequate volume   Culture   Final    NO GROWTH 4 DAYS Performed at [facility], [address] [place], [place] [address]    Report Status PENDING  Incomplete  Blood Culture (routine x 2)      Status: None (Preliminary result)   Collection Time: 04/30/24  5:01 PM   Specimen: BLOOD  Result Value Ref Range Status   Specimen Description BLOOD SITE NOT SPECIFIED  Final   Special Requests   Final    BOTTLES DRAWN AEROBIC ONLY Blood Culture results may not be optimal due to an inadequate volume of blood received in culture bottles   Culture   Final    NO GROWTH 4 DAYS Performed at [facility], [address], [place], [place] [address]    Report Status PENDING  Incomplete     Radiology Studies: CT WRIST LEFT W CONTRAST Result Date: 05/02/2024 EXAM: CT LEFT WRIST, WITH IV CONTRAST 05/02/2024 02:31:07 PM TECHNIQUE: Axial images were acquired through the left wrist with IV contrast. Reformatted images were reviewed. Automated exposure control, iterative reconstruction, and/or weight based adjustment of the mA/kV was utilized to reduce the radiation dose to as low as reasonably achievable. COMPARISON: None provided. CLINICAL HISTORY: Soft tissue infection in the hand/wrist, dog bite. FINDINGS: LIMITATIONS/ARTIFACTS: Motion artifact is present, reducing diagnostic sensitivity and specificity. BONES: Mild degenerative findings of the radiocarpal articulation with degenerative subcortical cysts along the distal radial articular surface. Small geodes in the carpus including the lunate. No acute fracture or focal osseous lesion. JOINTS: No malalignment. No dislocation. The joint spaces are normal. SOFT TISSUES: No drainable abscess identified. Trace subcutaneous edema dorsal to the proximal hand. No abnormal gas in the soft tissues. IMPRESSION: 1. Trace subcutaneous edema dorsal to the proximal hand, without soft tissue gas or drainable abscess. 2. No acute osseous abnormality or malalignment. 3. Mild degenerative change of the radiocarpal articulation with degenerative subcortical cysts along the distal radial articular surface and lunate. Electronically signed by: Ryan Salvage MD 05/02/2024  03:28 PM EST RP Workstation: [id number]   CT HAND LEFT W CONTRAST Result Date: 05/02/2024 EXAM: CT left hand,  with IV contrast TECHNIQUE: Axial images were acquired through the left hand with IV contrast. Reformatted images were reviewed. Automated exposure control, iterative reconstruction, and/or weight based adjustment of the mA/kV was utilized to reduce the radiation dose to as low as reasonably achievable. Motion artifact is present, reducing diagnostic sensitivity and specificity. COMPARISON: None available. CLINICAL HISTORY: Refractory cellulitis of the left hand despite oral antibiotics; original dog bite along the dorsal left hand between the 1st and 2nd digits in late December 2025; diminished range of motion and pain. FINDINGS: BONES AND JOINTS: Mild degenerative findings at the radiocarpal articulation including spurring and mild degenerative subcortical cyst formation in the distal radius. No bony destructive findings characteristic of osteomyelitis. No acute fracture or focal osseous lesion. No dislocation. The joint spaces are normal. SOFT TISSUES: No soft tissue abscess or gas tracking in the soft tissues identified. Low grade dorsal subcutaneous edema along the dorsum of the hand potentially reflecting cellulitis but not striking in degree. IMPRESSION: 1. No soft tissue abscess or soft tissue gas, and no CT findings of osteomyelitis. 2. Low-grade dorsal subcutaneous edema along the dorsum of the hand, compatible with clinically evidence cellulitis. 3. Motion artifact, reducing diagnostic sensitivity and specificity. 4. Mild degenerative change at the radiocarpal articulation, including spurring and mild degenerative subcortical cyst formation in the distal radius. Electronically signed by: Ryan Salvage MD 05/02/2024 02:54 PM EST RP Workstation: [id number]    Scheduled Meds:  amoxicillin -clavulanate  1 tablet Oral Q12H   colchicine   0.6 mg Oral BID   doxycycline   100 mg Oral Q12H    enoxaparin  (LOVENOX ) injection  40 mg Subcutaneous Q24H   furosemide   40 mg Oral BID   insulin  aspart  0-5 Units Subcutaneous QHS   insulin  aspart  0-9 Units Subcutaneous TID WC   insulin  glargine-yfgn  20 Units Subcutaneous Q2200   lidocaine   1 patch Transdermal Daily   polyethylene glycol  17 g Oral Daily   senna-docusate  1 tablet Oral BID   Continuous Infusions:     LOS: 3 days   Ivonne Mustache, MD [facility] P1/16/2026, 11:18 AM  "

## 2024-05-04 NOTE — Evaluation (Signed)
 Occupational Therapy Evaluation Patient Details Name: Sheila [name] MRN: [id number] DOB: Sep 22, 1950 Today's Date: 05/04/2024   History of Present Illness   Pt is a 74 y.o. female admitted 1/12 with L hand cellulitis due to dog bite. During admission, pt with c/o increasing pain L hip/knee. Suspected gout flair. PMH: DM, HTN, gout     Clinical Impressions PT admitted with cellulitis L hand. Pt currently with functional limitiations due to the deficits listed below (see OT problem list). Pt at baseline indep with all adls. Pt currently with L side pain limiting all over and calls it sharp  Pt will benefit from skilled OT to increase their independence and safety with adls and balance to allow discharge skilled inpatient follow up therapy, <3 hours/day. .      If plan is discharge home, recommend the following:         Functional Status Assessment   Patient has had a recent decline in their functional status and demonstrates the ability to make significant improvements in function in a reasonable and predictable amount of time.     Equipment Recommendations   Wheelchair cushion (measurements OT);Wheelchair (measurements OT);Hospital bed;Hoyer lift;BSC/3in1     Recommendations for Other Services         Precautions/Restrictions   Precautions Precautions: Fall Recall of Precautions/Restrictions: Intact Restrictions Weight Bearing Restrictions Per Provider Order: No     Mobility Bed Mobility Overal bed mobility: Needs Assistance             General bed mobility comments: bed placed in chair position and with increased time tolerating. pt starting to fall asleep after IV medication push    Transfers                   General transfer comment: unable to attempt      Balance                                           ADL either performed or assessed with clinical judgement   ADL Overall ADL's : Needs  assistance/impaired Eating/Feeding: Minimal assistance   Grooming: Minimal assistance   Upper Body Bathing: Moderate assistance   Lower Body Bathing: Total assistance   Upper Body Dressing : Moderate assistance   Lower Body Dressing: Total assistance                 General ADL Comments: bed placed in chair position     Vision Patient Visual Report: No change from baseline       Perception         Praxis         Pertinent Vitals/Pain Pain Assessment Pain Assessment: 0-10 Pain Score: 10-Worst pain ever Pain Location: L leg hand entire side Pain Descriptors / Indicators: Discomfort, Grimacing, Guarding, Moaning, Sharp Pain Intervention(s): Monitored during session, Premedicated before session, Repositioned, Limited activity within patient's tolerance     Extremity/Trunk Assessment Upper Extremity Assessment Upper Extremity Assessment: Left hand dominant;LUE deficits/detail LUE Deficits / Details: no edema noted, pt with no digit composite movement noted. pt resistent to any tactile input from therapist. pt unable to demonstrates, pt with shoulder flexion deficits due to previous shoulder injury, elbow flexion without demo of extension LUE: Unable to fully assess due to pain LUE Coordination: decreased fine motor;decreased gross motor   Lower Extremity Assessment Lower Extremity Assessment: Defer to PT evaluation;LLE deficits/detail LLE  Deficits / Details: yelled with the slightest tactile input. pt calling out with chair hip flexion LLE: Unable to fully assess due to pain   Cervical / Trunk Assessment Cervical / Trunk Assessment: Kyphotic   Communication Communication Communication: No apparent difficulties   Cognition Arousal: Lethargic Behavior During Therapy: Flat affect Cognition: Cognition impaired, No family/caregiver present to determine baseline     Awareness: Online awareness impaired, Intellectual awareness impaired   Attention impairment  (select first level of impairment): Sustained attention                     Following commands: Impaired       Cueing  General Comments   Cueing Techniques: Verbal cues;Gestural cues;Visual cues  redness at wrist   Exercises     Shoulder Instructions      Home Living Family/patient expects to be discharged to:: Private residence Living Arrangements: Spouse/significant other Available Help at Discharge: Family;Available 24 hours/day Type of Home: House Home Access: Stairs to enter [employer] of Steps: 2 Entrance Stairs-Rails: Left Home Layout: One level     Bathroom Shower/Tub: Producer, Television/film/video: Handicapped height     Home Equipment: Rollator (4 wheels);Rolling Walker (2 wheels);Cane - single point;Shower seat   Additional Comments: has two yorkies-- one of which is the dog that bit her      Prior Functioning/Environment Prior Level of Function : Independent/Modified Independent             Mobility Comments: rollator in house, cane in community      OT Problem List: Decreased safety awareness;Decreased knowledge of use of DME or AE;Decreased knowledge of precautions;Decreased strength;Decreased range of motion;Decreased activity tolerance;Pain;Impaired UE functional use   OT Treatment/Interventions: Self-care/ADL training;Therapeutic exercise;DME and/or AE instruction;Energy conservation;Therapeutic activities;Patient/family education;Balance training      OT Goals(Current goals can be found in the care plan section)   Acute Rehab OT Goals Patient Stated Goal: none stated OT Goal Formulation: Patient unable to participate in goal setting Time For Goal Achievement: 05/18/24 Potential to Achieve Goals: Good   OT Frequency:  Min 3X/week    Co-evaluation              AM-PAC OT 6 Clicks Daily Activity     Outcome Measure Help from another person eating meals?: A Lot Help from another person taking care of  personal grooming?: Total Help from another person toileting, which includes using toliet, bedpan, or urinal?: Total Help from another person bathing (including washing, rinsing, drying)?: Total Help from another person to put on and taking off regular upper body clothing?: Total Help from another person to put on and taking off regular lower body clothing?: Total 6 Click Score: 7   End of Session Nurse Communication: Mobility status;Precautions;Need for lift equipment  Activity Tolerance: Patient limited by pain Patient left: in bed;with call bell/phone within reach;with bed alarm set  OT Visit Diagnosis: Unsteadiness on feet (R26.81);Muscle weakness (generalized) (M62.81);Pain                Time: 1343-1355 OT Time Calculation (min): 12 min Charges:  OT General Charges $OT Visit: 1 Visit OT Evaluation $OT Eval Moderate Complexity: 1 Mod   Brynn, OTR/L  Acute Rehabilitation Services Office: [phone] .   Ely Molt 05/04/2024, 3:34 PM

## 2024-05-05 DIAGNOSIS — L03114 Cellulitis of left upper limb: Secondary | ICD-10-CM | POA: Diagnosis not present

## 2024-05-05 DIAGNOSIS — M109 Gout, unspecified: Secondary | ICD-10-CM | POA: Diagnosis not present

## 2024-05-05 LAB — SYNOVIAL CELL COUNT + DIFF, W/ CRYSTALS
Eosinophils-Synovial: 0 % (ref 0–1)
Lymphocytes-Synovial Fld: 1 % (ref 0–20)
Monocyte-Macrophage-Synovial Fluid: 0 % — ABNORMAL LOW (ref 50–90)
Neutrophil, Synovial: 99 % — ABNORMAL HIGH (ref 0–25)
WBC, Synovial: 189 /mm3 (ref 0–200)

## 2024-05-05 LAB — CULTURE, BLOOD (ROUTINE X 2)
Culture: NO GROWTH
Culture: NO GROWTH
Special Requests: ADEQUATE

## 2024-05-05 LAB — GLUCOSE, CAPILLARY
Glucose-Capillary: 215 mg/dL — ABNORMAL HIGH (ref 70–99)
Glucose-Capillary: 191 mg/dL — ABNORMAL HIGH (ref 70–99)
Glucose-Capillary: 135 mg/dL — ABNORMAL HIGH (ref 70–99)
Glucose-Capillary: 201 mg/dL — ABNORMAL HIGH (ref 70–99)

## 2024-05-05 LAB — CBC
HCT: 31.9 % — ABNORMAL LOW (ref 36.0–46.0)
Hemoglobin: 10.3 g/dL — ABNORMAL LOW (ref 12.0–15.0)
MCH: 28.5 pg (ref 26.0–34.0)
MCHC: 32.3 g/dL (ref 30.0–36.0)
MCV: 88.4 fL (ref 80.0–100.0)
Platelets: 387 K/uL (ref 150–400)
RBC: 3.61 MIL/uL — ABNORMAL LOW (ref 3.87–5.11)
RDW: 14.6 % (ref 11.5–15.5)
WBC: 21.1 K/uL — ABNORMAL HIGH (ref 4.0–10.5)
nRBC: 0 % (ref 0.0–0.2)

## 2024-05-05 LAB — BASIC METABOLIC PANEL WITH GFR
Anion gap: 12 (ref 5–15)
BUN: 25 mg/dL — ABNORMAL HIGH (ref 8–23)
CO2: 21 mmol/L — ABNORMAL LOW (ref 22–32)
Calcium: 8.7 mg/dL — ABNORMAL LOW (ref 8.9–10.3)
Chloride: 100 mmol/L (ref 98–111)
Creatinine, Ser: 1.18 mg/dL — ABNORMAL HIGH (ref 0.44–1.00)
GFR, Estimated: 49 mL/min — ABNORMAL LOW
Glucose, Bld: 240 mg/dL — ABNORMAL HIGH (ref 70–99)
Potassium: 3.9 mmol/L (ref 3.5–5.1)
Sodium: 133 mmol/L — ABNORMAL LOW (ref 135–145)

## 2024-05-05 MED ORDER — POLYETHYLENE GLYCOL 3350 17 G PO PACK: 17.0000 g | PACK | Freq: Every day | ORAL | Status: DC | PRN

## 2024-05-05 MED ORDER — DICLOFENAC SODIUM 1 % EX GEL
4.0000 g | Freq: Four times a day (QID) | CUTANEOUS | Status: DC
  Administered 2024-05-05 – 2024-05-07 (×7): 4 g via TOPICAL
  Filled 2024-05-05: qty 100

## 2024-05-05 NOTE — Plan of Care (Signed)
" °  Problem: Education: Goal: Ability to describe self-care measures that may prevent or decrease complications (Diabetes Survival Skills Education) will improve Outcome: Progressing Goal: Individualized Educational Video(s) Outcome: Progressing   Problem: Coping: Goal: Ability to adjust to condition or change in health will improve Outcome: Progressing   Problem: Fluid Volume: Goal: Ability to maintain a balanced intake and output will improve Outcome: Progressing   Problem: Nutritional: Goal: Maintenance of adequate nutrition will improve Outcome: Progressing Goal: Progress toward achieving an optimal weight will improve Outcome: Progressing   Problem: Skin Integrity: Goal: Risk for impaired skin integrity will decrease Outcome: Progressing   Problem: Clinical Measurements: Goal: Ability to maintain clinical measurements within normal limits will improve Outcome: Progressing Goal: Will remain free from infection Outcome: Progressing Goal: Diagnostic test results will improve Outcome: Progressing Goal: Respiratory complications will improve Outcome: Progressing Goal: Cardiovascular complication will be avoided Outcome: Progressing   Problem: Activity: Goal: Risk for activity intolerance will decrease Outcome: Progressing   Problem: Nutrition: Goal: Adequate nutrition will be maintained Outcome: Progressing   Problem: Elimination: Goal: Will not experience complications related to bowel motility Outcome: Progressing Goal: Will not experience complications related to urinary retention Outcome: Progressing   Problem: Pain Managment: Goal: General experience of comfort will improve and/or be controlled Outcome: Progressing   "

## 2024-05-05 NOTE — Consult Note (Signed)
 "    ORTHOPAEDIC CONSULTATION  REQUESTING PHYSICIAN: Briana Elgin LABOR, MD  Chief Complaint: left knee pain  HPI: Sheila [name] is a 74 y.o. female with history of diabetes type 2, chronic leukocytosis with baseline WBC count of 14-18 who presented to the ED for left hand infection. She has been admitted since 04/30/24. Orthopedics consulted for left knee pain. Patient states knee pain began a few days ago. She denies any traumatic injury to the knee. She has a long history of left knee pain. She has to get injections every 6 months by her PCP. She also has a history of gout. She states her knee is more painful than usual. She is having trouble standing.   Past Medical History:  Diagnosis Date   DM (diabetes mellitus) (HCC)    GERD (gastroesophageal reflux disease)    Gout    Hyperlipidemia    Hypertension    Past Surgical History:  Procedure Laterality Date   BIOPSY  02/13/2021   Procedure: BIOPSY;  Surgeon: Eartha Angelia Sieving, MD;  Location: [facility];  Service: Gastroenterology;;   ESOPHAGEAL DILATION N/A 02/13/2021   Procedure: ESOPHAGEAL DILATION;  Surgeon: Eartha Angelia Sieving, MD;  Location: [facility];  Service: Gastroenterology;  Laterality: N/A;   ESOPHAGOGASTRODUODENOSCOPY N/A 11/07/2020   Cirigliano: r/t food impaction in lower third of esophagus, food bolus advanced into stomach with air insufflation and gentle endoscopic pressure, small hh, bening appearing esphageal stenosis, normal stomach and duodenum, no specimens   ESOPHAGOGASTRODUODENOSCOPY (EGD) WITH PROPOFOL  N/A 02/13/2021   Procedure: ESOPHAGOGASTRODUODENOSCOPY (EGD) WITH PROPOFOL ;  Surgeon: Eartha Angelia Sieving, MD;  Location: [facility];  Service: Gastroenterology;  Laterality: N/A;   FOREIGN BODY REMOVAL  11/07/2020   Procedure: FOREIGN BODY REMOVAL;  Surgeon: San Sandor GAILS, DO;  Location: [facility];  Service: Gastroenterology;;   REVERSE SHOULDER ARTHROPLASTY Right 09/17/2020    Procedure: REVERSE SHOULDER ARTHROPLASTY;  Surgeon: Cristy Bonner DASEN, MD;  Location: [facility];  Service: Orthopedics;  Laterality: Right;   Social History   Socioeconomic History   Marital status: Married    Spouse name: Not on file   Number of children: Not on file   Years of education: Not on file   Highest education level: Not on file  Occupational History   Not on file  Tobacco Use   Smoking status: Former    Current packs/day: 0.00    Average packs/day: 1 pack/day for 2.0 years (2.0 ttl pk-yrs)    Types: Cigarettes    Start date: 04/19/1973    Quit date: 04/20/1975    Years since quitting: 49.0   Smokeless tobacco: Never  Vaping Use   Vaping status: Never Used  Substance and Sexual Activity   Alcohol  use: Never   Drug use: Never   Sexual activity: Not on file  Other Topics Concern   Not on file  Social History Narrative   Not on file   Social Drivers of Health   Tobacco Use: Medium Risk (05/01/2024)   Patient History    Smoking Tobacco Use: Former    Smokeless Tobacco Use: Never    Passive Exposure: Not on Actuary Strain: Not on file  Food Insecurity: No Food Insecurity (05/01/2024)   Epic    Worried About Programme Researcher, Broadcasting/film/video in the Last Year: Never true    Ran Out of Food in the Last Year: Never true  Transportation Needs: No Transportation Needs (05/01/2024)   Epic  Lack of Transportation (Medical): No    Lack of Transportation (Non-Medical): No  Physical Activity: Not on file  Stress: Not on file  Social Connections: Socially Isolated (05/01/2024)   Social Connection and Isolation Panel    Frequency of Communication with Friends and Family: Twice a week    Frequency of Social Gatherings with Friends and Family: Never    Attends Religious Services: Never    Database Administrator or Organizations: No    Attends Banker Meetings: Never    Marital Status: Married  Depression (PHQ2-9): Not on file  Alcohol  Screen:  Not on file  Housing: Low Risk (05/01/2024)   Epic    Unable to Pay for Housing in the Last Year: No    Number of Times Moved in the Last Year: 0    Homeless in the Last Year: No  Utilities: Not At Risk (05/01/2024)   Epic    Threatened with loss of utilities: No  Health Literacy: Not on file   Family History  Problem Relation Age of Onset   Heart disease Father    Allergies[1] Prior to Admission medications  Medication Sig Start Date End Date Taking? Authorizing Provider  acetaminophen  (TYLENOL ) 650 MG CR tablet Take 650-1,300 mg by mouth every 8 (eight) hours as needed for pain.   Yes [provider]  albuterol  (VENTOLIN  HFA) 108 (90 Base) MCG/ACT inhaler Inhale 2 puffs into the lungs every 6 (six) hours as needed for wheezing or shortness of breath.   Yes [provider]  ASPERCREME LIDOCAINE  4 % CREA Apply 1 application  topically 4 (four) times daily as needed (Hand pain; joint pain).   Yes [provider]  doxycycline  (VIBRAMYCIN ) 100 MG capsule Take 1 capsule (100 mg total) by mouth 2 (two) times daily. 04/27/24  Yes Raspet, Erin K, PA-C  empagliflozin (JARDIANCE) 25 MG TABS tablet Take 25 mg by mouth in the morning.   Yes [provider]  famotidine  (PEPCID ) 20 MG tablet One after supper Patient taking differently: Take 20 mg by mouth daily as needed for heartburn or indigestion. One after supper 01/14/23  Yes [name], MD  furosemide  (LASIX ) 40 MG tablet Take 40 mg by mouth in the morning and at bedtime. 04/26/18  Yes [provider]  HUMALOG KWIKPEN 100 UNIT/ML KwikPen Inject 3-15 Units into the skin with breakfast, with lunch, and with evening meal. 03/29/24  Yes [provider]  insulin  degludec (TRESIBA) 200 UNIT/ML FlexTouch Pen Inject 56 Units into the skin at bedtime.   Yes [provider]  Magnesium 500 MG CAPS Take 500 mg by mouth in the morning.   Yes [provider]  Menthol , Topical Analgesic,  (BIOFREEZE COOL THE PAIN) 5 % PTCH Apply 1 patch topically as needed (left shoulder pain).   Yes [provider]  pantoprazole  (PROTONIX ) 40 MG tablet Take 1 tablet (40 mg total) by mouth daily. Take 30-60 min before first meal of the day Patient taking differently: Take 40 mg by mouth at bedtime as needed (Heartburn and reflux). Take 30-60 min before first meal of the day 01/14/23  Yes [name], MD  traMADol  (ULTRAM ) 50 MG tablet Take 50 mg by mouth 3 (three) times daily as needed. 04/25/24  Yes [provider]  amoxicillin -clavulanate (AUGMENTIN ) 875-125 MG tablet Take 1 tablet by mouth every 12 (twelve) hours. Patient not taking: Reported on 04/30/2024 04/30/24   [provider]  Insulin  Detemir (LEVEMIR FLEXTOUCH) 100  UNIT/ML Pen Levemir FlexTouch U-100 Insulin  100 unit/mL (3 mL) subcutaneous pen  05/03/18  [provider]   DG HIP UNILAT WITH PELVIS 2-3 VIEWS LEFT Result Date: 05/04/2024 CLINICAL DATA:  Left hip pain EXAM: DG HIP (WITH OR WITHOUT PELVIS) 2-3V LEFT COMPARISON:  None Available. FINDINGS: There is no evidence of hip fracture or dislocation. There is no evidence of arthropathy or other focal bone abnormality. IMPRESSION: Negative. Electronically Signed   By: Lynwood Landy Raddle M.D.   On: 05/04/2024 11:55   DG Knee 1-2 Views Left Result Date: 05/04/2024 EXAM: 1 OR 2 VIEW(S) XRAY OF THE LEFT KNEE 05/04/2024 11:31:00 AM COMPARISON: None available. CLINICAL HISTORY: Left knee pain FINDINGS: BONES AND JOINTS: No acute fracture. No malalignment. Moderate knee joint effusion. Enthesopathic changes at the superior patellar pole. Moderate tricompartmental joint space narrowing and degenerative changes. SOFT TISSUES: Unremarkable. IMPRESSION: 1. Moderate knee joint effusion. No acute fracture. Electronically signed by: Lonni Necessary MD 05/04/2024 11:55 AM EST RP Workstation: [id number]   Family History Reviewed and non-contributory, no pertinent history  of problems with bleeding or anesthesia      Review of Systems 14 system ROS conducted and negative except for that noted in HPI   OBJECTIVE  Vitals:Patient Vitals for the past 8 hrs:  BP Temp Temp src Pulse Resp SpO2  05/05/24 1210 (!) 143/69 98.7 F (37.1 C) Oral 85 18 98 %  05/05/24 0725 (!) 164/75 97.8 F (36.6 C) Oral 80 18 100 %   General: Alert, no acute distress Cardiovascular: Warm extremities noted Respiratory: No cyanosis, no use of accessory musculature GI: No organomegaly, abdomen is soft and non-tender Skin: No lesions in the area of chief complaint other than those listed below in MSK exam.  Neurologic: Sensation intact distally save for the below mentioned MSK exam Psychiatric: Patient is competent for consent with normal mood and affect Lymphatic: No swelling obvious and reported other than the area involved in the exam below  Extremities   OOZ:fnizmjuz effusion of the left knee. No redness appreciated. Tender to palpation throughout the left knee. Pain with ROM. Only tolerates a few degrees of passive flexion. Non tender to palpation left calf. Compartments soft and compressible. + GS/TA/EHL. Sensation intact in DP/SP/S/S/P distributions. 2+ DP pulse with warm and well perfused digits.     Test Results Imaging Xrays of the left knee demonstrate moderate to severe tricompartmental osteoarthritis. Bone on bone articulation of the medial compartment.   Labs cbc Recent Labs    05/04/24 0505 05/05/24 0601  WBC 19.9* 21.1*  HGB 10.8* 10.3*  HCT 34.0* 31.9*  PLT 396 387    Labs inflam No results for input(s): CRP in the last 72 hours.  Invalid input(s): ESR  Labs coag No results for input(s): INR, PTT in the last 72 hours.  Invalid input(s): PT  Recent Labs    05/04/24 0505 05/05/24 0601  NA 137 133*  K 3.4* 3.9  CL 104 100  CO2 21* 21*  GLUCOSE 69* 240*  BUN 26* 25*  CREATININE 1.12* 1.18*  CALCIUM 8.7* 8.7*     ASSESSMENT  AND PLAN: 74 y.o. female with the following: left knee pain and swelling in a patient with an arthritic knee and previous history of gout. WBC trending up at 21.1 today. Undergoing treatment for left hand infection.  Discussed with the patient, I recommend a left knee aspiration to ensure no infection has spread to her left knee. Patient is in agreement with the treatment  plan. We will follow results closely. Discussed if there are signs of infection with her results, we will had to perform a washout of the left knee. Patient states her understanding of this.   Aspiration performed without difficulty. Patient tolerated procedure well. Aspirate did look more consistent with gout, but will send off for cell count and culture to make sure. Aspirate was not purulent so I did perform a left knee injection with marcaine  and kenalog . See procedure note.   - Weight Bearing Status/Activity: WBAT, Ice, elevate - Additional recommended labs/tests: cell count, crystals, culture  - VTE Prophylaxis: per medicine team - Pain control: per medicine  - Follow-up plan: TBD. Will follow cultures - Procedures: left knee aspiration and injection  Aleck Stalling, PA-C 05/05/24     [1]  Allergies Allergen Reactions   Lisinopril Diarrhea   Metformin Diarrhea   "

## 2024-05-05 NOTE — Procedures (Addendum)
 Procedure: Left knee aspiration and injection   Indication: Left knee effusion(s)   Provider: Aleck Stalling, PA-C   Assist: None   Anesthesia: None   EBL: None   Complications: None   Findings: After risks/benefits explained patient desires to undergo procedure. Consent obtained and time out performed. The left knee was sterilely prepped and aspirated. Pt tolerated the procedure well.    PRE-PROCEDURE DIAGNOSIS:  Left knee effusion(s) POST-OPERATIVE DIAGNOSIS:  Same PROCEDURE:  Aspiration / Intra-articular injection left knee  PROCEDURE DETAILS:  After informed verbal consent was obtained the superolateral portal was prepped with chlorhexidine . Using a 20 mL syringe with an 18-gauge needle was used to aspirate approximately 12 ml of blood tinged cloudy, straw colored fluid with signs of white particulate matter present.  4 ml of Marcaine  and 1 ml of Depo-Medrol  (40mg ) was then injected into the joint space. She tolerated this well without complication and a Band-Aid was placed.   Aspirate sent to lab for cell count, crystals, and culture.   Aleck Stalling, PA-C 05/05/2024 3:19 PM

## 2024-05-05 NOTE — Progress Notes (Signed)
 Occupational Therapy Treatment Patient Details Name: Melek Pownall MRN: [id number] DOB: 1951/01/15 Today's Date: 05/05/2024   History of present illness Pt is a 74 y.o. female admitted 1/12 with L hand cellulitis due to dog bite. During admission, pt with c/o increasing pain L hip/knee. Suspected gout flair. PMH: DM, HTN, gout   OT comments  Pt. Seen for skilled OT treatment session.  Pt. Able to complete components of bed mobility with MIN-MAX A.  Able to maintain un supported sitting and attempted partial sit/stand but could not tolerate or achieve full standing secondary to c/o L side pain and needing to sit back down.  2 person assist for bed mobility back to bed.  Agree with current d/c recommendations.  Cont. With acute OT POC with focus on bed mobility and adl progression as pt. Able.        If plan is discharge home, recommend the following:      Equipment Recommendations  Wheelchair cushion (measurements OT);Wheelchair (measurements OT);Hospital bed;Hoyer lift;BSC/3in1    Recommendations for Other Services      Precautions / Restrictions Precautions Precautions: Fall Recall of Precautions/Restrictions: Intact       Mobility Bed Mobility Overal bed mobility: Needs Assistance Bed Mobility: Rolling, Supine to Sit, Sit to Supine Rolling: Contact guard assist, Min assist   Supine to sit: Max assist, HOB elevated Sit to supine: Max assist   General bed mobility comments: able to roll L/R for peri care, and bed pad repostioning (cna assisted with this portion at end of session along with pulling pt. up in bed wit pad and boost function) supine to sit max a pt. attempting to use bed rail.  wanting assistance with movement of LLE but very sensitive to it.  able to achieve sitting eob.  back to bed max a to get into supine and support bles and bring them into bed, then required 2 person assist for repositioning    Transfers Overall transfer level: Needs assistance Equipment  used: Rolling walker (2 wheels) Transfers: Sit to/from Stand Sit to Stand: Max assist           General transfer comment: height of bed elevated and support provided to BLEs pt. with heavy forearm reliance despite other rec. for ue support.  pt. with both forearms draped over arms of rw.  buttocks paritially off of eob but not full clearance and pt. states she needs to sit back down.  unable to scoot towards hob so cna assisted and we utilized pads and bed functions to get her into bed and then pulled up and repositioned.     Balance                                           ADL either performed or assessed with clinical judgement   ADL Overall ADL's : Needs assistance/impaired                         Toilet Transfer: Maximal assistance;Total assistance Toilet Transfer Details (indicate cue type and reason): pt. on bed pan upon arrival. able to roll L for removal and completion of peri care           General ADL Comments: bed placed in chair position    Extremity/Trunk Assessment              Vision  Perception     Praxis     Communication Communication Communication: No apparent difficulties   Cognition Arousal: Alert Behavior During Therapy: WFL for tasks assessed/performed Cognition: Cognition impaired, No family/caregiver present to determine baseline     Awareness: Online awareness impaired, Intellectual awareness impaired   Attention impairment (select first level of impairment): Sustained attention                     Following commands: Impaired        Cueing   Cueing Techniques: Verbal cues, Gestural cues, Visual cues  Exercises      Shoulder Instructions       General Comments      Pertinent Vitals/ Pain       Pain Assessment Pain Assessment: Faces Faces Pain Scale: Hurts even more Pain Location: L leg hand entire side Pain Descriptors / Indicators: Discomfort, Grimacing, Guarding, Moaning,  Sharp Pain Intervention(s): Limited activity within patient's tolerance, Monitored during session, Repositioned  Home Living                                          Prior Functioning/Environment              Frequency  Min 3X/week        Progress Toward Goals  OT Goals(current goals can now be found in the care plan section)  Progress towards OT goals: Progressing toward goals     Plan      Co-evaluation                 AM-PAC OT 6 Clicks Daily Activity     Outcome Measure   Help from another person eating meals?: A Lot Help from another person taking care of personal grooming?: Total Help from another person toileting, which includes using toliet, bedpan, or urinal?: Total Help from another person bathing (including washing, rinsing, drying)?: Total Help from another person to put on and taking off regular upper body clothing?: Total Help from another person to put on and taking off regular lower body clothing?: Total 6 Click Score: 7    End of Session Equipment Utilized During Treatment: Gait belt;Rolling walker (2 wheels)  OT Visit Diagnosis: Unsteadiness on feet (R26.81);Muscle weakness (generalized) (M62.81);Pain   Activity Tolerance Patient limited by pain   Patient Left in bed;with call bell/phone within reach;with bed alarm set   Nurse Communication Other (comment) (updated CNA of liquid stools in bed pan)        Time: 8997-8978 OT Time Calculation (min): 19 min  Charges: OT General Charges $OT Visit: 1 Visit OT Treatments $Self Care/Home Management : 8-22 mins  Randall, COTA/L Acute Rehabilitation [phone]   CHRISTELLA Nest Lorraine-COTA/L  05/05/2024, 1:12 PM

## 2024-05-05 NOTE — Hospital Course (Signed)
 Sheila [name] is a 74 y.o. female with a history of diabetes mellitus type 2, chronic leukocytosis, CKD stage IIIa, arthritis, obesity.  Patient presented secondary to left hand redness and found to have evidence of cellulitis. Septic joint ruled out. Patient managed on antibiotics with improving symptoms.

## 2024-05-05 NOTE — Progress Notes (Signed)
 "  PROGRESS NOTE    [name]  FMW:[id number] DOB: 11/02/1950 DOA: 04/30/2024 PCP: [name], MD   Brief Narrative: Sheila [name] is a 74 y.o. female with a history of diabetes mellitus type 2, chronic leukocytosis, CKD stage IIIa, arthritis, obesity.  Patient presented secondary to left hand redness and found to have evidence of cellulitis. Septic joint ruled out. Patient managed on antibiotics with improving symptoms.   Assessment and Plan:  Left hand cellulitis Patient started on Vancomycin  and Unasyn  empirically on admission. Imaging without evidence of osteomyelitis, although MRI was severely limited. CT significant for no evidence of osteomyelitis or abscess. Vancomycin  transitioned to doxycycline  and Unasyn  transitioned to Augmentin . - Continue Augmentin  and doxycycline   Chronic leukocytosis Baseline WBC appears to be around 16,000 - 17,000.  Left knee pain Concern for possible gout flare. X-ray obtained and is significant for moderate knee effusion. Patient was started on colchicine  and has some mild improvement today. - Continue colchicine  - Orthopedic surgery consultation for consideration of left knee arthrocentesis - Voltaren  gel  Hypokalemia Resolved with potassium supplementation.  Diabetes mellitus type 2 Poorly controlled based on hemoglobin A1C of 8.5%. Prior to arrival medication(s) include Jardiance, Tresiba and Humalog. - Continue Semglee  and SSI  CKD stage IIIa Baseline creatinine of about 1.3. stable.  Obesity, class II Estimated body mass index is 36.73 kg/m (pended) as calculated from the following:   Height as of this encounter: (P) 5' (1.524 m).   Weight as of this encounter: 85.3 kg.   DVT prophylaxis: Lovenox  Code Status:   Code Status: Full Code Family Communication: None at bedside Disposition Plan: Discharge to SNF pending bed availability vs home if ability to ambulation improves   Consultants:  Orthopedic surgery  Procedures:   None  Antimicrobials: Vancomycin  Unasyn  Augmentin  Doxycycline     Subjective: Patient reports ongoing left knee pain. Left wrist pain is improved a bit, but still has pain.  Objective: BP (!) 164/75 (BP Location: Right Wrist)   Pulse 80   Temp 97.8 F (36.6 C) (Oral)   Resp 18   Ht (P) 5' (1.524 m)   Wt 85.3 kg   SpO2 100%   BMI (P) 36.73 kg/m   Examination:  General exam: Appears calm and comfortable. Respiratory system: Clear to auscultation. Respiratory effort normal. Cardiovascular system: S1 & S2 heard, RRR. Gastrointestinal system: Abdomen is nondistended, soft and nontender. Normal bowel sounds heard. Central nervous system: Alert and oriented. No focal neurological deficits. Musculoskeletal: No edema. No calf tenderness. Left wrist with diminished range of motion secondary to pain. Left knee with effusion, tenderness and decreased range of motion secondary to pain. Skin: No cyanosis. No rashes Psychiatry: Judgement and insight appear normal. Mood & affect appropriate.    Data Reviewed: I have personally reviewed following labs and imaging studies  CBC Lab Results  Component Value Date   WBC 21.1 (H) 05/05/2024   RBC 3.61 (L) 05/05/2024   HGB 10.3 (L) 05/05/2024   HCT 31.9 (L) 05/05/2024   MCV 88.4 05/05/2024   MCH 28.5 05/05/2024   PLT 387 05/05/2024   MCHC 32.3 05/05/2024   RDW 14.6 05/05/2024   LYMPHSABS 3.7 05/01/2024   MONOABS 1.5 (H) 05/01/2024   EOSABS 0.2 05/01/2024   BASOSABS 0.1 05/01/2024     Last metabolic panel Lab Results  Component Value Date   NA 133 (L) 05/05/2024   K 3.9 05/05/2024   CL 100 05/05/2024   CO2 21 (L) 05/05/2024   BUN 25 (  H) 05/05/2024   CREATININE 1.18 (H) 05/05/2024   GLUCOSE 240 (H) 05/05/2024   GFRNONAA 49 (L) 05/05/2024   CALCIUM 8.7 (L) 05/05/2024   PROT 7.9 05/01/2024   ALBUMIN 3.6 05/01/2024   BILITOT 0.5 05/01/2024   ALKPHOS 194 (H) 05/01/2024   AST 23 05/01/2024   ALT 16 05/01/2024   ANIONGAP  12 05/05/2024    GFR: Estimated Creatinine Clearance: 42.4 mL/min (A) (by C-G formula based on SCr of 1.18 mg/dL (H)).  Recent Results (from the past 240 hours)  Blood Culture (routine x 2)     Status: None   Collection Time: 04/30/24  4:45 PM   Specimen: BLOOD RIGHT HAND  Result Value Ref Range Status   Specimen Description BLOOD RIGHT HAND  Final   Special Requests   Final    BOTTLES DRAWN AEROBIC AND ANAEROBIC Blood Culture adequate volume   Culture   Final    NO GROWTH 5 DAYS Performed at [facility], [address], [place], [place] [address]    Report Status 05/05/2024 FINAL  Final  Blood Culture (routine x 2)     Status: None   Collection Time: 04/30/24  5:01 PM   Specimen: BLOOD  Result Value Ref Range Status   Specimen Description BLOOD SITE NOT SPECIFIED  Final   Special Requests   Final    BOTTLES DRAWN AEROBIC ONLY Blood Culture results may not be optimal due to an inadequate volume of blood received in culture bottles   Culture   Final    NO GROWTH 5 DAYS Performed at [facility], [address] [place], [place] [address]    Report Status 05/05/2024 FINAL  Final      Radiology Studies: DG HIP UNILAT WITH PELVIS 2-3 VIEWS LEFT Result Date: 05/04/2024 CLINICAL DATA:  Left hip pain EXAM: DG HIP (WITH OR WITHOUT PELVIS) 2-3V LEFT COMPARISON:  None Available. FINDINGS: There is no evidence of hip fracture or dislocation. There is no evidence of arthropathy or other focal bone abnormality. IMPRESSION: Negative. Electronically Signed   By: Lynwood Landy Raddle M.D.   On: 05/04/2024 11:55   DG Knee 1-2 Views Left Result Date: 05/04/2024 EXAM: 1 OR 2 VIEW(S) XRAY OF THE LEFT KNEE 05/04/2024 11:31:00 AM COMPARISON: None available. CLINICAL HISTORY: Left knee pain FINDINGS: BONES AND JOINTS: No acute fracture. No malalignment. Moderate knee joint effusion. Enthesopathic changes at the superior patellar pole. Moderate tricompartmental joint space narrowing and  degenerative changes. SOFT TISSUES: Unremarkable. IMPRESSION: 1. Moderate knee joint effusion. No acute fracture. Electronically signed by: Lonni Necessary MD 05/04/2024 11:55 AM EST RP Workstation: [id number]      LOS: 4 days    Elgin Lam, MD [facility] 05/05/2024, 8:26 AM   If 7PM-7AM, please contact night-coverage [email or web address]  "

## 2024-05-06 DIAGNOSIS — M109 Gout, unspecified: Secondary | ICD-10-CM

## 2024-05-06 DIAGNOSIS — L03114 Cellulitis of left upper limb: Secondary | ICD-10-CM | POA: Diagnosis not present

## 2024-05-06 LAB — CBC
HCT: 35 % — ABNORMAL LOW (ref 36.0–46.0)
Hemoglobin: 11 g/dL — ABNORMAL LOW (ref 12.0–15.0)
MCH: 28.1 pg (ref 26.0–34.0)
MCHC: 31.4 g/dL (ref 30.0–36.0)
MCV: 89.5 fL (ref 80.0–100.0)
Platelets: 449 K/uL — ABNORMAL HIGH (ref 150–400)
RBC: 3.91 MIL/uL (ref 3.87–5.11)
RDW: 14.5 % (ref 11.5–15.5)
WBC: 16.8 K/uL — ABNORMAL HIGH (ref 4.0–10.5)
nRBC: 0 % (ref 0.0–0.2)

## 2024-05-06 LAB — GLUCOSE, CAPILLARY
Glucose-Capillary: 232 mg/dL — ABNORMAL HIGH (ref 70–99)
Glucose-Capillary: 240 mg/dL — ABNORMAL HIGH (ref 70–99)
Glucose-Capillary: 238 mg/dL — ABNORMAL HIGH (ref 70–99)
Glucose-Capillary: 197 mg/dL — ABNORMAL HIGH (ref 70–99)

## 2024-05-06 NOTE — TOC Initial Note (Signed)
 Transition of Care [facility]) - Initial/Assessment Note    Patient Details  Name: Sheila [name] MRN: [id number] Date of Birth: 1950/05/18  Transition of Care [facility]) CM/SW Contact:    Isaiah Public, LCSWA Phone Number: 05/06/2024, 11:50 AM  Clinical Narrative:                  CSW received consult for possible SNF placement at time of discharge. CSW spoke with patient regarding PT recommendation of SNF placement at time of discharge. Patient reports PTA she comes from home with spouse. Patient expressed understanding of PT recommendation and is agreeable for CSW to fax out for SNF placement to see what SNF offers she receives. Patient wants to see how she does with PT tomorrow before making final decision. .CSW discussed insurance authorization process.All questions answered.No further questions reported at this time. CSW to follow back with patient tomorrow after she is able to work with PT to discuss dc plan SNF vrs. Returning back home.CSW to continue to follow and assist with discharge planning needs.   Expected Discharge Plan:  (TBD SNF vrs. going home) Barriers to Discharge: Continued Medical Work up   Patient Goals and CMS Choice Patient states their goals for this hospitalization and ongoing recovery are:: TBD SNF Vrs. going homr. Patient wants to see how she does with PT tomorrow before making final decision   Choice offered to / list presented to : Patient      Expected Discharge Plan and Services In-house Referral: Clinical Social Work     Living arrangements for the past 2 months: Single Family Home                                      Prior Living Arrangements/Services Living arrangements for the past 2 months: Single Family Home Lives with:: Spouse Patient language and need for interpreter reviewed:: Yes Do you feel safe going back to the place where you live?:  (TBD SNF vrs. Home)      Need for Family Participation in Patient Care: Yes (Comment) Care giver  support system in place?: Yes (comment)   Criminal Activity/Legal Involvement Pertinent to Current Situation/Hospitalization: No - Comment as needed  Activities of Daily Living   ADL Screening (condition at time of admission) Independently performs ADLs?: Yes (appropriate for developmental age) Is the patient deaf or have difficulty hearing?: No Does the patient have difficulty seeing, even when wearing glasses/contacts?: No Does the patient have difficulty concentrating, remembering, or making decisions?: No  Permission Sought/Granted   Permission granted to share information with : Yes, Verbal Permission Granted     Permission granted to share info w AGENCY: SNF        Emotional Assessment   Attitude/Demeanor/Rapport: Gracious Affect (typically observed): Calm Orientation: : Oriented to Self, Oriented to Place, Oriented to  Time, Oriented to Situation Alcohol  / Substance Use: Not Applicable Psych Involvement: No (comment)  Admission diagnosis:  Animal bite [T14.8XXA] Cellulitis of left hand [L03.114] Cellulitis of finger of left hand [L03.012] Cellulitis of left upper extremity [L03.114] Patient Active Problem List   Diagnosis Date Noted   Hypokalemia 05/01/2024   Cellulitis of finger of left hand 05/01/2024   Cellulitis of left hand 04/30/2024   Callus 10/14/2021   Food impaction of esophagus    Hiatal hernia    Benign esophageal stricture    Postprocedural pneumothorax 09/17/2020   DM2 (diabetes mellitus, type 2) (  HCC) 09/17/2020   GERD (gastroesophageal reflux disease) 09/17/2020   Leukocytosis 09/17/2020   S/P reverse total shoulder arthroplasty, right 09/17/2020   Acute respiratory failure with hypoxia (HCC) 09/17/2020   Gout attack 11/08/2018   Edema 10/24/2018   Ankle pain 06/06/2017   Upper airway cough syndrome vs cough variant asthma 12/16/2015   Abnormal WBC count 02/26/2015   Arthritis 01/22/2015   History of gout 01/22/2015   Mild intermittent  asthma without complication 01/22/2015   PCP:  [name], MD Pharmacy:   [facility] [phone] - [place], [place] - [address] AT [facility] [address] [place] [address] Phone: [phone] Fax: [phone]     Social Drivers of Health (SDOH) Social History: SDOH Screenings   Food Insecurity: No Food Insecurity (05/01/2024)  Housing: Low Risk (05/01/2024)  Transportation Needs: No Transportation Needs (05/01/2024)  Utilities: Not At Risk (05/01/2024)  Social Connections: Socially Isolated (05/01/2024)  Tobacco Use: Medium Risk (05/01/2024)   SDOH Interventions: Food Insecurity Interventions: Intervention Not Indicated, Inpatient TOC Social Connections Interventions: Community Resources Provided, Inpatient TOC   Readmission Risk Interventions     No data to display

## 2024-05-06 NOTE — Progress Notes (Signed)
 Synovial fluid analysis findings are consistent with gout. No surgical intervention needed. Continue current gout treatment. Patient may follow up with her primary care outpatient. Patient does have moderate to severe left knee osteoarthritis. If she wishes to seek surgical intervention for this, we would have her see one of our total knee arthroplasty colleagues.  Aleck Stalling, [place]- 05/06/24

## 2024-05-06 NOTE — NC FL2 (Signed)
 " [place]  MEDICAID FL2 LEVEL OF CARE FORM     IDENTIFICATION  Patient Name: Sheila [name] Birthdate: Sep 04, 1950 Sex: female Admission Date (Current Location): 04/30/2024  [facility] and [place] Number:  Producer, Television/film/video and Address:  [facility] [place], [address] [place], [place] [address]      Provider Number: [id number]  Attending Physician Name and Address:  Briana Elgin LABOR, MD  Relative Name and Phone Number:  Helaine Yackel (spouse) [phone]    Current Level of Care: Hospital Recommended Level of Care: Skilled Nursing Facility Prior Approval Number:    Date Approved/Denied:   PASRR Number: [id number]  Discharge Plan: SNF    Current Diagnoses: Patient Active Problem List   Diagnosis Date Noted   Hypokalemia 05/01/2024   Cellulitis of finger of left hand 05/01/2024   Cellulitis of left hand 04/30/2024   Callus 10/14/2021   Food impaction of esophagus    Hiatal hernia    Benign esophageal stricture    Postprocedural pneumothorax 09/17/2020   DM2 (diabetes mellitus, type 2) (HCC) 09/17/2020   GERD (gastroesophageal reflux disease) 09/17/2020   Leukocytosis 09/17/2020   S/P reverse total shoulder arthroplasty, right 09/17/2020   Acute respiratory failure with hypoxia (HCC) 09/17/2020   Gout attack 11/08/2018   Edema 10/24/2018   Ankle pain 06/06/2017   Upper airway cough syndrome vs cough variant asthma 12/16/2015   Abnormal WBC count 02/26/2015   Arthritis 01/22/2015   History of gout 01/22/2015   Mild intermittent asthma without complication 01/22/2015    Orientation RESPIRATION BLADDER Height & Weight     Self, Time, Situation, Place  Normal Continent Weight: 188 lb 0.8 oz (85.3 kg) Height:  (P) 5' (152.4 cm)  BEHAVIORAL SYMPTOMS/MOOD NEUROLOGICAL BOWEL NUTRITION STATUS      Continent Diet (Please see discharge summary)  AMBULATORY STATUS COMMUNICATION OF NEEDS Skin   Extensive Assist Verbally Other (Comment)  (Ecchymosis,scattered,Erythema,Buttocks,Bil.)                       Personal Care Assistance Level of Assistance  Bathing, Feeding, Dressing Bathing Assistance: Maximum assistance Feeding assistance: Maximum assistance Dressing Assistance: Maximum assistance     Functional Limitations Info  Sight, Hearing, Speech Sight Info: Adequate Hearing Info: Adequate Speech Info: Adequate    SPECIAL CARE FACTORS FREQUENCY  PT (By licensed PT), OT (By licensed OT)     PT Frequency: 5x min weekly OT Frequency: 5x min weekly            Contractures Contractures Info: Not present    Additional Factors Info  Code Status, Allergies, Insulin  Sliding Scale Code Status Info: FULL Allergies Info: Lisinopril,Metformin   Insulin  Sliding Scale Info: insulin  aspart (novoLOG ) injection 0-5 Units daily at bedtime,  insulin  aspart (novoLOG ) injection 0-9 Units 3 times daily with meals,  insulin  glargine-yfgn (SEMGLEE ) injection 20 Units daily at 10pm       Current Medications (05/06/2024):  This is the current hospital active medication list Current Facility-Administered Medications  Medication Dose Route Frequency Provider Last Rate Last Admin   acetaminophen  (TYLENOL ) tablet 650 mg  650 mg Oral Q6H PRN Howerter, Justin B, DO   650 mg at 05/05/24 0154   Or   acetaminophen  (TYLENOL ) suppository 650 mg  650 mg Rectal Q6H PRN Howerter, Justin B, DO       amoxicillin -clavulanate (AUGMENTIN ) 875-125 MG per tablet 1 tablet  1 tablet Oral Q12H Jillian Buttery, MD   1 tablet at  05/06/24 9061   colchicine  tablet 0.6 mg  0.6 mg Oral BID Jillian Buttery, MD   0.6 mg at 05/06/24 9060   diclofenac  Sodium (VOLTAREN ) 1 % topical gel 4 g  4 g Topical QID Briana Elgin LABOR, MD   4 g at 05/06/24 0940   doxycycline  (VIBRA -TABS) tablet 100 mg  100 mg Oral Q12H Jillian Buttery, MD   100 mg at 05/06/24 9060   enoxaparin  (LOVENOX ) injection 40 mg  40 mg Subcutaneous Q24H Jillian Buttery, MD   40 mg at 05/05/24 1429    furosemide  (LASIX ) tablet 40 mg  40 mg Oral BID Jillian Buttery, MD   40 mg at 05/06/24 9061   HYDROmorphone  (DILAUDID ) injection 0.5 mg  0.5 mg Intravenous Q2H PRN Howerter, Justin B, DO   0.5 mg at 05/06/24 0319   insulin  aspart (novoLOG ) injection 0-5 Units  0-5 Units Subcutaneous QHS Howerter, Justin B, DO   2 Units at 05/05/24 2206   insulin  aspart (novoLOG ) injection 0-9 Units  0-9 Units Subcutaneous TID WC Howerter, Justin B, DO   3 Units at 05/06/24 9060   insulin  glargine-yfgn (SEMGLEE ) injection 20 Units  20 Units Subcutaneous Q2200 Howerter, Justin B, DO   20 Units at 05/05/24 2204   lidocaine  (LIDODERM ) 5 % 1 patch  1 patch Transdermal Daily Adhikari, Amrit, MD   1 patch at 05/05/24 0856   melatonin tablet 3 mg  3 mg Oral QHS PRN Howerter, Justin B, DO   3 mg at 05/05/24 0154   naloxone  (NARCAN ) injection 0.4 mg  0.4 mg Intravenous PRN Howerter, Justin B, DO       ondansetron  (ZOFRAN ) injection 4 mg  4 mg Intravenous Q6H PRN Howerter, Justin B, DO   4 mg at 05/06/24 9677   oxyCODONE  (Oxy IR/ROXICODONE ) immediate release tablet 7.5 mg  7.5 mg Oral Q4H PRN Adhikari, Amrit, MD       polyethylene glycol (MIRALAX  / GLYCOLAX ) packet 17 g  17 g Oral Daily PRN Briana Elgin LABOR, MD         Discharge Medications: Please see discharge summary for a list of discharge medications.  Relevant Imaging Results:  Relevant Lab Results:   Additional Information [id number]  Isaiah Public, LCSWA     "

## 2024-05-06 NOTE — Progress Notes (Signed)
 "  PROGRESS NOTE    Sheila [name]  FMW:[id number] DOB: Mar 29, 1951 DOA: 04/30/2024 PCP: [name], MD   Brief Narrative: Sheila [name] is a 74 y.o. female with a history of diabetes mellitus type 2, chronic leukocytosis, CKD stage IIIa, arthritis, obesity.  Patient presented secondary to left hand redness and found to have evidence of cellulitis. Septic joint ruled out. Patient managed on antibiotics with improving symptoms.   Assessment and Plan:  Left hand cellulitis Patient started on Vancomycin  and Unasyn  empirically on admission. Imaging without evidence of osteomyelitis, although MRI was severely limited. CT significant for no evidence of osteomyelitis or abscess. Vancomycin  transitioned to doxycycline  and Unasyn  transitioned to Augmentin . - Continue Augmentin  and doxycycline   Chronic leukocytosis Baseline WBC appears to be around 16,000 - 17,000. Stable.  Left knee gout Left knee effusion Concern for possible gout flare. X-ray obtained and is significant for moderate knee effusion. Patient was started on colchicine . Orthopedic surgery consulted and performed an arthrocentesis with injection of marcaine  and kenalog  on 1/17. Fluid analysis confirms gout. - Continue colchicine  until symptoms improved - Voltaren  gel  Hypokalemia Resolved with potassium supplementation.  Diabetes mellitus type 2 Poorly controlled based on hemoglobin A1C of 8.5%. Prior to arrival medication(s) include Jardiance, Tresiba and Humalog. - Continue Semglee  and SSI  CKD stage IIIa Baseline creatinine of about 1.3. stable.  Obesity, class II Estimated body mass index is 36.73 kg/m (pended) as calculated from the following:   Height as of this encounter: (P) 5' (1.524 m).   Weight as of this encounter: 85.3 kg.   DVT prophylaxis: Lovenox  Code Status:   Code Status: Full Code Family Communication: None at bedside Disposition Plan: Discharge to SNF pending bed availability vs home if ability to  ambulation improves   Consultants:  Orthopedic surgery  Procedures:  None  Antimicrobials: Vancomycin  Unasyn  Augmentin  Doxycycline     Subjective: Left knee pain improved after arthrocentesis.  Objective: BP (!) 149/63 (BP Location: Right Arm)   Pulse 73   Temp 98 F (36.7 C) (Oral)   Resp 16   Ht (P) 5' (1.524 m)   Wt 85.3 kg   SpO2 99%   BMI (P) 36.73 kg/m   Examination:  General exam: Appears calm and comfortable. Respiratory system: Clear to auscultation. Respiratory effort normal. Cardiovascular system: S1 & S2 heard, RRR. Gastrointestinal system: Abdomen is nondistended, soft and nontender. Normal bowel sounds heard. Central nervous system: Alert and oriented. No focal neurological deficits. Musculoskeletal: No edema. No calf tenderness. Improving ROM of left wrist. Left knee in ACE bandage wrap Psychiatry: Judgement and insight appear normal. Mood & affect appropriate.    Data Reviewed: I have personally reviewed following labs and imaging studies  CBC Lab Results  Component Value Date   WBC 16.8 (H) 05/06/2024   RBC 3.91 05/06/2024   HGB 11.0 (L) 05/06/2024   HCT 35.0 (L) 05/06/2024   MCV 89.5 05/06/2024   MCH 28.1 05/06/2024   PLT 449 (H) 05/06/2024   MCHC 31.4 05/06/2024   RDW 14.5 05/06/2024   LYMPHSABS 3.7 05/01/2024   MONOABS 1.5 (H) 05/01/2024   EOSABS 0.2 05/01/2024   BASOSABS 0.1 05/01/2024     Last metabolic panel Lab Results  Component Value Date   NA 133 (L) 05/05/2024   K 3.9 05/05/2024   CL 100 05/05/2024   CO2 21 (L) 05/05/2024   BUN 25 (H) 05/05/2024   CREATININE 1.18 (H) 05/05/2024   GLUCOSE 240 (H) 05/05/2024   GFRNONAA 49 (  L) 05/05/2024   CALCIUM 8.7 (L) 05/05/2024   PROT 7.9 05/01/2024   ALBUMIN 3.6 05/01/2024   BILITOT 0.5 05/01/2024   ALKPHOS 194 (H) 05/01/2024   AST 23 05/01/2024   ALT 16 05/01/2024   ANIONGAP 12 05/05/2024    GFR: Estimated Creatinine Clearance: 42.4 mL/min (A) (by C-G formula based on  SCr of 1.18 mg/dL (H)).  Recent Results (from the past 240 hours)  Blood Culture (routine x 2)     Status: None   Collection Time: 04/30/24  4:45 PM   Specimen: BLOOD RIGHT HAND  Result Value Ref Range Status   Specimen Description BLOOD RIGHT HAND  Final   Special Requests   Final    BOTTLES DRAWN AEROBIC AND ANAEROBIC Blood Culture adequate volume   Culture   Final    NO GROWTH 5 DAYS Performed at [facility], [address], [place], [place] [address]    Report Status 05/05/2024 FINAL  Final  Blood Culture (routine x 2)     Status: None   Collection Time: 04/30/24  5:01 PM   Specimen: BLOOD  Result Value Ref Range Status   Specimen Description BLOOD SITE NOT SPECIFIED  Final   Special Requests   Final    BOTTLES DRAWN AEROBIC ONLY Blood Culture results may not be optimal due to an inadequate volume of blood received in culture bottles   Culture   Final    NO GROWTH 5 DAYS Performed at [facility], [address], [place], [place] [address]    Report Status 05/05/2024 FINAL  Final  Anaerobic culture w Gram Stain     Status: None (Preliminary result)   Collection Time: 05/05/24  2:58 PM   Specimen: Synovium; Synovial Fluid  Result Value Ref Range Status   Specimen Description SYNOVIAL  Final   Special Requests LEFT KNEE  Final   Gram Stain   Final    RARE WBC PRESENT, PREDOMINANTLY PMN NO ORGANISMS SEEN Performed at [facility], [address] [place], [place] [address]    Culture PENDING  Incomplete   Report Status PENDING  Incomplete      Radiology Studies: No results found.     LOS: 5 days    Elgin Lam, MD [facility] 05/06/2024, 12:53 PM   If 7PM-7AM, please contact night-coverage [email or web address]  "

## 2024-05-07 DIAGNOSIS — L03114 Cellulitis of left upper limb: Secondary | ICD-10-CM | POA: Diagnosis not present

## 2024-05-07 LAB — GLUCOSE, CAPILLARY
Glucose-Capillary: 145 mg/dL — ABNORMAL HIGH (ref 70–99)
Glucose-Capillary: 201 mg/dL — ABNORMAL HIGH (ref 70–99)
Glucose-Capillary: 157 mg/dL — ABNORMAL HIGH (ref 70–99)

## 2024-05-07 NOTE — TOC Progression Note (Signed)
 Transition of Care [facility]) - Progression Note    Patient Details  Name: Sheila [name] MRN: [id number] Date of Birth: 07-16-1950  Transition of Care [facility]) CM/SW Contact  [name] [place], [place] Phone Number: 05/07/2024, 3:29 PM  Clinical Narrative:     CSW met with pt and provided SNF bed offers. Pt prefers to go home instead of SNF at DC. Lives at home with husband and states she has adult kids locally who will help. Wants a 3in1 and is agreeable to [facility] PT/OT.  MD and RNCM updated.   Expected Discharge Plan: Home w Home Health Services Barriers to Discharge: Continued Medical Work up               Expected Discharge Plan and Services In-house Referral: Clinical Social Work     Living arrangements for the past 2 months: Single Family Home                                       Social Drivers of Health (SDOH) Interventions SDOH Screenings   Food Insecurity: No Food Insecurity (05/01/2024)  Housing: Low Risk (05/01/2024)  Transportation Needs: No Transportation Needs (05/01/2024)  Utilities: Not At Risk (05/01/2024)  Social Connections: Socially Isolated (05/01/2024)  Tobacco Use: Medium Risk (05/01/2024)    Readmission Risk Interventions     No data to display

## 2024-05-07 NOTE — Progress Notes (Signed)
 Physical Therapy Treatment Patient Details Name: Sheila [name] MRN: [id number] DOB: 10/05/1950 Today's Date: 05/07/2024   History of Present Illness Pt is a 74 y.o. female admitted 1/12 with L hand cellulitis due to dog bite. During admission, pt with c/o increasing pain L hip/knee. Suspected gout flair. PMH: DM, HTN, gout    PT Comments  Pt resting in bed on arrival, eager for mobility and demonstrating good progress towards acute goals. With increased time and heavy rail use pt able to come to sitting EOB with CGA for safety. Pt standing from EOB, recliner and rollator seat during session with up to min A needed to boost and steady from low surfaces. Pt demonstrating gait with rollator support with up to min A to maintain balance with pt resting forearms on rollator handles with pt stating this is how she ambulates at home. Pt continues to be limited by decreased activity tolerance, fatigue, impaired balance/postural reactions and pain and will benefit from continued inpatient follow up therapy, <3 hours/day, will continue to follow acutely.     If plan is discharge home, recommend the following: A little help with walking and/or transfers;A little help with bathing/dressing/bathroom;Assistance with cooking/housework;Help with stairs or ramp for entrance;Assist for transportation   Can travel by private vehicle        Equipment Recommendations  Other (comment) (TBD next venue)    Recommendations for Other Services       Precautions / Restrictions Precautions Precautions: Fall Recall of Precautions/Restrictions: Intact Restrictions Weight Bearing Restrictions Per Provider Order: No     Mobility  Bed Mobility Overal bed mobility: Needs Assistance Bed Mobility: Supine to Sit     Supine to sit: Contact guard, HOB elevated, Used rails     General bed mobility comments: CGA fro safety with use of bed features    Transfers Overall transfer level: Needs assistance Equipment used:  Rollator (4 wheels), Rolling walker (2 wheels) Transfers: Sit to/from Stand, Bed to chair/wheelchair/BSC Sit to Stand: Min assist   Step pivot transfers: Contact guard assist       General transfer comment: min A to rise from EOB to RW, CGA from recliner, min A to steady with stepping around to recliner with RW fro support with pt resting forearms on RW    Ambulation/Gait Ambulation/Gait assistance: Min Chemical Engineer (Feet): 50 Feet Assistive device: Rollator (4 wheels) Gait Pattern/deviations: Step-through pattern, Wide base of support, Shuffle Gait velocity: decr     General Gait Details: pt ambulating with rollator suppot with forearms resting in rollator with pt stating this is how she ambulates at home, pt with noted external rotation of RLE, min A to maintain balance, orthopedic shoes donned throughout   Stairs             Wheelchair Mobility     Tilt Bed    Modified Rankin (Stroke Patients Only)       Balance Overall balance assessment: Needs assistance Sitting-balance support: No upper extremity supported, Feet supported Sitting balance-Leahy Scale: Fair     Standing balance support: Bilateral upper extremity supported, During functional activity, Reliant on assistive device for balance Standing balance-Leahy Scale: Poor Standing balance comment: Dependent on RW and external support                            Communication Communication Communication: No apparent difficulties  Cognition Arousal: Alert Behavior During Therapy: WFL for tasks assessed/performed  Following commands: Impaired Following commands impaired: Follows one step commands with increased time, Follows multi-step commands inconsistently    Cueing Cueing Techniques: Verbal cues, Visual cues, Tactile cues  Exercises      General Comments General comments (skin integrity, edema, etc.): VSS on RA      Pertinent  Vitals/Pain Pain Assessment Pain Assessment: Faces Faces Pain Scale: Hurts a little bit Pain Location: L knee with mobility Pain Descriptors / Indicators: Discomfort, Grimacing, Guarding Pain Intervention(s): Monitored during session, Limited activity within patient's tolerance, Repositioned, Ice applied    Home Living                          Prior Function            PT Goals (current goals can now be found in the care plan section) Acute Rehab PT Goals Patient Stated Goal: home PT Goal Formulation: With patient Time For Goal Achievement: 05/18/24 Progress towards PT goals: Progressing toward goals    Frequency    Min 2X/week      PT Plan      Co-evaluation              AM-PAC PT 6 Clicks Mobility   Outcome Measure  Help needed turning from your back to your side while in a flat bed without using bedrails?: A Little Help needed moving from lying on your back to sitting on the side of a flat bed without using bedrails?: A Little Help needed moving to and from a bed to a chair (including a wheelchair)?: A Little Help needed standing up from a chair using your arms (e.g., wheelchair or bedside chair)?: A Little Help needed to walk in hospital room?: A Little Help needed climbing 3-5 steps with a railing? : Total 6 Click Score: 16    End of Session   Activity Tolerance: Patient tolerated treatment well Patient left: with call bell/phone within reach;in chair Nurse Communication: Mobility status PT Visit Diagnosis: Other abnormalities of gait and mobility (R26.89);Pain Pain - Right/Left: Left Pain - part of body: Leg     Time: 8893-8868 PT Time Calculation (min) (ACUTE ONLY): 25 min  Charges:    $Gait Training: 8-22 mins $Therapeutic Activity: 8-22 mins PT General Charges $$ ACUTE PT VISIT: 1 Visit                     Jermeka Schlotterbeck R. PTA Acute Rehabilitation Services Office: [phone]   Therisa CHRISTELLA Boor 05/07/2024, 2:24 PM

## 2024-05-07 NOTE — Progress Notes (Signed)
 Occupational Therapy Treatment Patient Details Name: Sheila [name] MRN: [id number] DOB: Aug 11, 1950 Today's Date: 05/07/2024   History of present illness Pt is a 74 y.o. female admitted 1/12 with L hand cellulitis due to dog bite. During admission, pt with c/o increasing pain L hip/knee. Suspected gout flair. PMH: DM, HTN, gout   OT comments  Pt is progressing well towards OT goals. Focus of session on progressing functional transfers to increase independent and safe engagement in ADL tasks. Pt required Min A +2 for functional transfer to [facility], and up to Max A for ADL engagement to complete toileting tasks. Pt continues to benefit from acute OT services to facilitate continued progress towards goals. Current d/c recommendation remains appropriate.       If plan is discharge home, recommend the following:      Equipment Recommendations  Wheelchair cushion (measurements OT);Wheelchair (measurements OT);Hospital bed;Hoyer lift;BSC/3in1    Recommendations for Other Services      Precautions / Restrictions Precautions Precautions: Fall Recall of Precautions/Restrictions: Intact Restrictions Weight Bearing Restrictions Per Provider Order: No       Mobility Bed Mobility               General bed mobility comments: Pt greeted in recliner and returned to recliner    Transfers Overall transfer level: Needs assistance Equipment used: Rolling walker (2 wheels) Transfers: Sit to/from Stand, Bed to chair/wheelchair/BSC Sit to Stand: Min assist, +2 physical assistance, +2 safety/equipment     Step pivot transfers: Min assist, +2 physical assistance, +2 safety/equipment     General transfer comment: Min A +2 for stand and step pivot recliner <-> BSC. Pt requires increased mulitmodal cues for proper hand placement on RW, noted trunk flexion in standing and forearm propping on RW.     Balance Overall balance assessment: Needs assistance Sitting-balance support: No upper extremity  supported, Feet supported Sitting balance-Leahy Scale: Fair     Standing balance support: Bilateral upper extremity supported, During functional activity, Reliant on assistive device for balance Standing balance-Leahy Scale: Poor Standing balance comment: Dependent on RW and external support                           ADL either performed or assessed with clinical judgement   ADL Overall ADL's : Needs assistance/impaired Eating/Feeding: Set up;Sitting                       Toilet Transfer: Minimal assistance;+2 for physical assistance;+2 for safety/equipment;BSC/3in1;Rolling walker (2 wheels) Toilet Transfer Details (indicate cue type and reason): Min A +2 for step pivot to [facility] on L side. Pt required increased verbal cues for proper hand placement on RW. Min A management of RW and cues for safe body positioning to sit Toileting- Clothing Manipulation and Hygiene: Maximal assistance Toileting - Clothing Manipulation Details (indicate cue type and reason): Max A in standing for anterior peri care and management of gown            Extremity/Trunk Assessment Upper Extremity Assessment Upper Extremity Assessment: Left hand dominant;LUE deficits/detail LUE Deficits / Details: Pt with decreased functional use of LUE. Pt instructed on proper use of RW with L hand grasp, Pt unable to maintain grasp, demonstrated preference for forearm propping            Vision       Perception     Praxis     Communication Communication Communication: No apparent difficulties  Cognition Arousal: Alert Behavior During Therapy: WFL for tasks assessed/performed Cognition: No apparent impairments             OT - Cognition Comments: Cognition WFL this session                 Following commands: Impaired Following commands impaired: Follows one step commands with increased time, Follows multi-step commands inconsistently      Cueing   Cueing Techniques: Verbal  cues, Visual cues, Tactile cues  Exercises      Shoulder Instructions       General Comments Educated Pt on importance of continued mobility and elevation of LUE and LLE. VSS on RA. Primary nurse present during session to administer medications.    Pertinent Vitals/ Pain       Pain Assessment Pain Assessment: Faces Faces Pain Scale: Hurts a little bit Pain Location: L knee with mobility Pain Descriptors / Indicators: Discomfort, Grimacing, Guarding Pain Intervention(s): Limited activity within patient's tolerance, Monitored during session, Repositioned  Home Living                                          Prior Functioning/Environment              Frequency  Min 3X/week        Progress Toward Goals  OT Goals(current goals can now be found in the care plan section)  Progress towards OT goals: Progressing toward goals  Acute Rehab OT Goals Patient Stated Goal: to get home OT Goal Formulation: Patient unable to participate in goal setting Time For Goal Achievement: 05/18/24 Potential to Achieve Goals: Good ADL Goals Pt Will Perform Upper Body Dressing: with set-up;sitting Pt Will Perform Lower Body Dressing: with min assist;with adaptive equipment;sit to/from stand Pt Will Transfer to Toilet: with +2 assist;with min assist;bedside commode;stand pivot transfer Additional ADL Goal #1: pt will complete bed mobility mod (A)  Plan      Co-evaluation                 AM-PAC OT 6 Clicks Daily Activity     Outcome Measure   Help from another person eating meals?: A Little Help from another person taking care of personal grooming?: Total Help from another person toileting, which includes using toliet, bedpan, or urinal?: A Lot Help from another person bathing (including washing, rinsing, drying)?: Total Help from another person to put on and taking off regular upper body clothing?: Total Help from another person to put on and taking off  regular lower body clothing?: Total 6 Click Score: 9    End of Session Equipment Utilized During Treatment: Gait belt;Rolling walker (2 wheels)  OT Visit Diagnosis: Unsteadiness on feet (R26.81);Muscle weakness (generalized) (M62.81);Pain   Activity Tolerance Patient tolerated treatment well   Patient Left in chair;with call bell/phone within reach   Nurse Communication Mobility status        Time: 8762-8746 OT Time Calculation (min): 16 min  Charges: OT General Charges $OT Visit: 1 Visit OT Treatments $Self Care/Home Management : 8-22 mins  Maurilio CROME, OTR/L.  [facility] Acute Rehabilitation  Office: [phone]   Maurilio PARAS Krishna Heuer 05/07/2024, 1:15 PM

## 2024-05-07 NOTE — Plan of Care (Signed)
" °  RD consulted for nutrition education regarding gout and diabetes.  Pt reports she will be discharging home today.  She lives with her husband. She does the cooking but has trouble standing for long periods of time. She starting paying more attention to what she was eating in early December and reports she was able to lose some weight. She thinks about 15 lb.  Pt wakes up at 11 am but does not eat until 4 pm (usually 2 fried chicken thighs or grilled chicken salad or mexican. She drinks 1 16 oz coke per day. She goes to bed at 7 pm but does not usually go to sleep until 7 am.   Lab Results  Component Value Date   HGBA1C 8.9 (H) 04/30/2024    RD provided Plate Method handout from the [employer]. Discussed different food groups and their effects on blood sugar, emphasizing carbohydrate-containing foods. Provided list of carbohydrates and recommended serving sizes of common foods.  Discussed importance of controlled and consistent carbohydrate intake throughout the day. Provided examples of ways to balance meals/snacks and encouraged intake of high-fiber, whole grain complex carbohydrates.  Discussed diet for gout.  Teach back method used.  Expect fair compliance.  Body mass index is 36.38 kg/m (pended). Pt meets criteria for obesity based on current BMI.  Current diet order is CHO Modified, patient is consuming approximately 50-100% of meals at this time. Labs and medications reviewed. No further nutrition interventions warranted at this time. RD contact information provided. If additional nutrition issues arise, please re-consult RD.  Powell SQUIBB., RD, LDN, CNSC See AMiON for contact information    "

## 2024-05-07 NOTE — Discharge Summary (Signed)
 " Physician Discharge Summary   Patient: Sheila [name] MRN: [id number] DOB: 27-Feb-1951  Admit date:     04/30/2024  Discharge date: 05/07/24  Discharge Physician: Elgin Lam, MD   PCP: [name], MD   Recommendations at discharge:  PCP visit for hospital follow-up  Discharge Diagnoses: Principal Problem:   Cellulitis of left hand Active Problems:   DM2 (diabetes mellitus, type 2) (HCC)   Leukocytosis   Hypokalemia   Cellulitis of finger of left hand  Resolved Problems:   * No resolved hospital problems. *  Hospital Course: Sheila [name] is a 74 y.o. female with a history of diabetes mellitus type 2, chronic leukocytosis, CKD stage IIIa, arthritis, obesity.  Patient presented secondary to left hand redness and found to have evidence of cellulitis. Septic joint ruled out. Patient managed on antibiotics with improved symptoms. Recommendation for SNF, however patient declined and will discharge home with home health services.  Assessment and Plan:  Left hand cellulitis Patient started on Vancomycin  and Unasyn  empirically on admission. Imaging without evidence of osteomyelitis, although MRI was severely limited. CT significant for no evidence of osteomyelitis or abscess. Vancomycin  transitioned to doxycycline  and Unasyn  transitioned to Augmentin . Patient completed antibiotic course.   Chronic leukocytosis Baseline WBC appears to be around 16,000 - 17,000. Stable.   Left knee gout Left knee effusion Concern for possible gout flare. X-ray obtained and is significant for moderate knee effusion. Patient was started on colchicine . Orthopedic surgery consulted and performed an arthrocentesis with injection of marcaine  and kenalog  on 1/17. Fluid analysis confirms gout. Colchicine  discontinued.   Hypokalemia Resolved with potassium supplementation.   Diabetes mellitus type 2 Poorly controlled based on hemoglobin A1C of 8.5%. Prior to arrival medication(s) include Jardiance, Tresiba and  Humalog. Continue home regimen on discharge.  CKD stage IIIa Baseline creatinine of about 1.3. stable.   Obesity, class II Estimated body mass index is 36.38 kg/m (pended) as calculated from the following:   Height as of this encounter: (P) 5' (1.524 m).   Weight as of this encounter: 84.5 kg.    Consultants:  Orthopedic surgery   Procedures:  None  Disposition: Home health; patient declined SNF Diet recommendation: Carb modified diet   DISCHARGE MEDICATION: Allergies as of 05/07/2024       Reactions   Lisinopril Diarrhea   Metformin Diarrhea        Medication List     STOP taking these medications    amoxicillin -clavulanate 875-125 MG tablet Commonly known as: AUGMENTIN    doxycycline  100 MG capsule Commonly known as: VIBRAMYCIN        TAKE these medications    acetaminophen  650 MG CR tablet Commonly known as: TYLENOL  Take 650-1,300 mg by mouth every 8 (eight) hours as needed for pain.   albuterol  108 (90 Base) MCG/ACT inhaler Commonly known as: VENTOLIN  HFA Inhale 2 puffs into the lungs every 6 (six) hours as needed for wheezing or shortness of breath.   Aspercreme Lidocaine  4 % Crea Generic drug: Lidocaine  HCl Apply 1 application  topically 4 (four) times daily as needed (Hand pain; joint pain).   Biofreeze Cool The Pain 5 % Ptch Generic drug: Menthol  (Topical Analgesic) Apply 1 patch topically as needed (left shoulder pain).   empagliflozin 25 MG Tabs tablet Commonly known as: JARDIANCE Take 25 mg by mouth in the morning.   famotidine  20 MG tablet Commonly known as: Pepcid  One after supper What changed:  how much to take how to take this when to take  this reasons to take this   furosemide  40 MG tablet Commonly known as: LASIX  Take 40 mg by mouth in the morning and at bedtime.   HumaLOG KwikPen 100 UNIT/ML KwikPen Generic drug: insulin  lispro Inject 3-15 Units into the skin with breakfast, with lunch, and with evening meal.    insulin  degludec 200 UNIT/ML FlexTouch Pen Commonly known as: TRESIBA Inject 56 Units into the skin at bedtime.   Magnesium 500 MG Caps Take 500 mg by mouth in the morning.   pantoprazole  40 MG tablet Commonly known as: Protonix  Take 1 tablet (40 mg total) by mouth daily. Take 30-60 min before first meal of the day What changed:  when to take this reasons to take this   traMADol  50 MG tablet Commonly known as: ULTRAM  Take 50 mg by mouth 3 (three) times daily as needed.               Durable Medical Equipment  (From admission, onward)           Start     Ordered   05/07/24 1542  For home use only DME Bedside commode  Once       Question:  Patient needs a bedside commode to treat with the following condition  Answer:  Physical deconditioning   05/07/24 1541            Contact information for after-discharge care     Home Medical Care     [facility]) .   Service: Home Health Services Why: Home health has been arranged. They will contact you to schedule an appointment within 48 hours postdischarge Contact information: [address] [facility] [place]  [id number] [phone]                    Discharge Exam: BP 136/64 (BP Location: Right Arm)   Pulse 62   Temp 98 F (36.7 C) (Oral)   Resp 16   Ht (P) 5' (1.524 m)   Wt 84.5 kg   SpO2 99%   BMI (P) 36.38 kg/m   General exam: Appears calm and comfortable. Respiratory system: Respiratory effort normal.   Condition at discharge: stable  The results of significant diagnostics from this hospitalization (including imaging, microbiology, ancillary and laboratory) are listed below for reference.   Imaging Studies: DG HIP UNILAT WITH PELVIS 2-3 VIEWS LEFT Result Date: 05/04/2024 CLINICAL DATA:  Left hip pain EXAM: DG HIP (WITH OR WITHOUT PELVIS) 2-3V LEFT COMPARISON:  None Available. FINDINGS: There is no evidence of hip fracture or dislocation. There  is no evidence of arthropathy or other focal bone abnormality. IMPRESSION: Negative. Electronically Signed   By: Lynwood Landy Raddle M.D.   On: 05/04/2024 11:55   DG Knee 1-2 Views Left Result Date: 05/04/2024 EXAM: 1 OR 2 VIEW(S) XRAY OF THE LEFT KNEE 05/04/2024 11:31:00 AM COMPARISON: None available. CLINICAL HISTORY: Left knee pain FINDINGS: BONES AND JOINTS: No acute fracture. No malalignment. Moderate knee joint effusion. Enthesopathic changes at the superior patellar pole. Moderate tricompartmental joint space narrowing and degenerative changes. SOFT TISSUES: Unremarkable. IMPRESSION: 1. Moderate knee joint effusion. No acute fracture. Electronically signed by: Lonni Necessary MD 05/04/2024 11:55 AM EST RP Workstation: [id number]   CT WRIST LEFT W CONTRAST Result Date: 05/02/2024 EXAM: CT LEFT WRIST, WITH IV CONTRAST 05/02/2024 02:31:07 PM TECHNIQUE: Axial images were acquired through the left wrist with IV contrast. Reformatted images were reviewed. Automated exposure control, iterative reconstruction, and/or weight based  adjustment of the mA/kV was utilized to reduce the radiation dose to as low as reasonably achievable. COMPARISON: None provided. CLINICAL HISTORY: Soft tissue infection in the hand/wrist, dog bite. FINDINGS: LIMITATIONS/ARTIFACTS: Motion artifact is present, reducing diagnostic sensitivity and specificity. BONES: Mild degenerative findings of the radiocarpal articulation with degenerative subcortical cysts along the distal radial articular surface. Small geodes in the carpus including the lunate. No acute fracture or focal osseous lesion. JOINTS: No malalignment. No dislocation. The joint spaces are normal. SOFT TISSUES: No drainable abscess identified. Trace subcutaneous edema dorsal to the proximal hand. No abnormal gas in the soft tissues. IMPRESSION: 1. Trace subcutaneous edema dorsal to the proximal hand, without soft tissue gas or drainable abscess. 2. No acute osseous  abnormality or malalignment. 3. Mild degenerative change of the radiocarpal articulation with degenerative subcortical cysts along the distal radial articular surface and lunate. Electronically signed by: Ryan Salvage MD 05/02/2024 03:28 PM EST RP Workstation: [id number]   CT HAND LEFT W CONTRAST Result Date: 05/02/2024 EXAM: CT left hand, with IV contrast TECHNIQUE: Axial images were acquired through the left hand with IV contrast. Reformatted images were reviewed. Automated exposure control, iterative reconstruction, and/or weight based adjustment of the mA/kV was utilized to reduce the radiation dose to as low as reasonably achievable. Motion artifact is present, reducing diagnostic sensitivity and specificity. COMPARISON: None available. CLINICAL HISTORY: Refractory cellulitis of the left hand despite oral antibiotics; original dog bite along the dorsal left hand between the 1st and 2nd digits in late December 2025; diminished range of motion and pain. FINDINGS: BONES AND JOINTS: Mild degenerative findings at the radiocarpal articulation including spurring and mild degenerative subcortical cyst formation in the distal radius. No bony destructive findings characteristic of osteomyelitis. No acute fracture or focal osseous lesion. No dislocation. The joint spaces are normal. SOFT TISSUES: No soft tissue abscess or gas tracking in the soft tissues identified. Low grade dorsal subcutaneous edema along the dorsum of the hand potentially reflecting cellulitis but not striking in degree. IMPRESSION: 1. No soft tissue abscess or soft tissue gas, and no CT findings of osteomyelitis. 2. Low-grade dorsal subcutaneous edema along the dorsum of the hand, compatible with clinically evidence cellulitis. 3. Motion artifact, reducing diagnostic sensitivity and specificity. 4. Mild degenerative change at the radiocarpal articulation, including spurring and mild degenerative subcortical cyst formation in the distal radius.  Electronically signed by: Ryan Salvage MD 05/02/2024 02:54 PM EST RP Workstation: [id number]   MR HAND LEFT WO CONTRAST Result Date: 05/01/2024 CLINICAL DATA:  Soft tissue infection suspected. History of dog bite. EXAM: MRI OF THE LEFT HAND WITHOUT CONTRAST TECHNIQUE: Multiplanar, multisequence MR imaging of the left hand was attempted. Patient was unable to complete the examination. Only axial noncontrast imaging was performed. This results in a limited study. No intravenous contrast was administered. COMPARISON:  Radiographs of the left hand 04/30/2024 and left wrist 04/27/2024. FINDINGS: Bones/Joint/Cartilage No evidence of acute fracture, dislocation or bone destruction on axial imaging. In correlation with prior radiographs, posttraumatic deformity of the distal radius with scattered degenerative subchondral cysts in the proximal carpal row. Nonspecific small to moderate radiocarpal and distal radioulnar joint effusions. Ligaments Suboptimally evaluated on axial images. Muscles and Tendons The flexor and extensor tendons appear grossly intact, without significant tenosynovitis. Mild nonspecific T2 hyperintensity within the thumb flexor musculature. Soft tissues Nonspecific generalized subcutaneous edema, greatest posteriorly. No organized fluid collection identified on limited non-contrast axial imaging. IMPRESSION: 1. Limited examination due to patient's inability to complete the examination. Only  axial noncontrast imaging was obtained. Consider further evaluation with repeat MRI or CT. 2. Nonspecific generalized subcutaneous edema, greatest posteriorly. No organized fluid collection identified on limited non-contrast axial imaging. 3. No evidence of osteomyelitis or septic arthritis. 4. Nonspecific radiocarpal and distal radioulnar joint effusions. 5. Posttraumatic deformity of the distal radius with scattered degenerative subchondral cysts in the proximal carpal row. Electronically Signed   By:  Elsie Perone M.D.   On: 05/01/2024 10:30   DG Hand Complete Left Result Date: 04/30/2024 EXAM: 3 OR MORE VIEW(S) XRAY OF THE LEFT HAND 04/30/2024 05:26:00 PM COMPARISON: None available. CLINICAL HISTORY: dog bite infection FINDINGS: BONES AND JOINTS: Old fracture deformity of the distal radius with mild residual dorsal tilt of the distal radial articular surface. Mild osteoarthritis of the radiocarpal joint. No malalignment. SOFT TISSUES: Soft tissue swelling of the hand and wrist. IMPRESSION: 1. Soft tissue swelling of the hand and wrist. 2. Old fracture deformity of the distal radius with mild residual dorsal tilt of the distal radial articular surface. Electronically signed by: Dorethia Molt MD MD 04/30/2024 06:28 PM EST RP Workstation: [id number]   DG Wrist Complete Left Result Date: 04/27/2024 CLINICAL DATA:  Left wrist pain after fall EXAM: LEFT WRIST - COMPLETE 3+ VIEW COMPARISON:  None Available. FINDINGS: There is no evidence of fracture or dislocation. There is no evidence of arthropathy or other focal bone abnormality. Soft tissues are unremarkable. IMPRESSION: Negative. Electronically Signed   By: Lynwood Landy Raddle M.D.   On: 04/27/2024 16:01   DG Elbow Complete Left Result Date: 04/27/2024 CLINICAL DATA:  Left elbow pain after fall EXAM: LEFT ELBOW - COMPLETE 3+ VIEW COMPARISON:  None Available. FINDINGS: There is no evidence of fracture, dislocation, or joint effusion. There is no evidence of arthropathy or other focal bone abnormality. Soft tissues are unremarkable. IMPRESSION: Negative. Electronically Signed   By: Lynwood Landy Raddle M.D.   On: 04/27/2024 15:58    Microbiology: Results for orders placed or performed during the hospital encounter of 04/30/24  Blood Culture (routine x 2)     Status: None   Collection Time: 04/30/24  4:45 PM   Specimen: BLOOD RIGHT HAND  Result Value Ref Range Status   Specimen Description BLOOD RIGHT HAND  Final   Special Requests   Final    BOTTLES  DRAWN AEROBIC AND ANAEROBIC Blood Culture adequate volume   Culture   Final    NO GROWTH 5 DAYS Performed at [facility], [address], [place], [place] [address]    Report Status 05/05/2024 FINAL  Final  Blood Culture (routine x 2)     Status: None   Collection Time: 04/30/24  5:01 PM   Specimen: BLOOD  Result Value Ref Range Status   Specimen Description BLOOD SITE NOT SPECIFIED  Final   Special Requests   Final    BOTTLES DRAWN AEROBIC ONLY Blood Culture results may not be optimal due to an inadequate volume of blood received in culture bottles   Culture   Final    NO GROWTH 5 DAYS Performed at [facility], [address] [place], [place] [address]    Report Status 05/05/2024 FINAL  Final  Anaerobic culture w Gram Stain     Status: None (Preliminary result)   Collection Time: 05/05/24  2:58 PM   Specimen: Synovium; Synovial Fluid  Result Value Ref Range Status   Specimen Description SYNOVIAL  Final   Special Requests LEFT KNEE  Final  Gram Stain   Final    RARE WBC PRESENT, PREDOMINANTLY PMN NO ORGANISMS SEEN Performed at [facility], [address] [place], [place] [address]    Culture   Final    NO ANAEROBES ISOLATED; CULTURE IN PROGRESS FOR 5 DAYS   Report Status PENDING  Incomplete    Labs: CBC: Recent Labs  Lab 04/30/24 1640 05/01/24 0050 05/02/24 0537 05/03/24 0453 05/04/24 0505 05/05/24 0601 05/06/24 1040  WBC 16.4* 18.4* 22.1* 19.1* 19.9* 21.1* 16.8*  NEUTROABS 11.8* 12.8*  --   --   --   --   --   HGB 12.8 11.7* 11.1* 10.4* 10.8* 10.3* 11.0*  HCT 41.5 37.3 35.2* 33.0* 34.0* 31.9* 35.0*  MCV 90.8 90.3 90.5 89.2 89.0 88.4 89.5  PLT 503* 445* 403* 367 396 387 449*   Basic Metabolic Panel: Recent Labs  Lab 04/30/24 1640 05/01/24 0050 05/02/24 0537 05/03/24 0453 05/04/24 0505 05/05/24 0601  NA 137 138 135 138 137 133*  K 3.0* 3.3* 3.8 3.3* 3.4* 3.9  CL 97* 99 101 105 104 100  CO2 23 22 19* 21* 21* 21*  GLUCOSE 156* 144*  166* 114* 69* 240*  BUN 40* 40* 37* 34* 26* 25*  CREATININE 1.51* 1.37* 1.36* 1.41* 1.12* 1.18*  CALCIUM 10.2 9.7 9.6 9.0 8.7* 8.7*  MG 1.9  --   --   --   --   --    Liver Function Tests: Recent Labs  Lab 04/30/24 1640 05/01/24 0050  AST 26 23  ALT 20 16  ALKPHOS 230* 194*  BILITOT 0.4 0.5  PROT 8.9* 7.9  ALBUMIN 4.1 3.6   CBG: Recent Labs  Lab 05/06/24 1150 05/06/24 1712 05/06/24 1958 05/07/24 0826 05/07/24 1150  GLUCAP 240* 238* 197* 145* 201*    Discharge time spent: 35 minutes.  Signed: Elgin Lam, MD [facility] 05/07/2024 "

## 2024-05-07 NOTE — Plan of Care (Signed)
" °  Problem: Health Behavior/Discharge Planning: Goal: Ability to manage health-related needs will improve Outcome: Progressing   Problem: Elimination: Goal: Will not experience complications related to bowel motility Outcome: Progressing   Problem: Skin Integrity: Goal: Risk for impaired skin integrity will decrease Outcome: Not Progressing   Problem: Pain Managment: Goal: General experience of comfort will improve and/or be controlled Outcome: Not Progressing   Problem: Skin Integrity: Goal: Risk for impaired skin integrity will decrease Outcome: Not Progressing   "

## 2024-05-07 NOTE — Progress Notes (Signed)
 "  PROGRESS NOTE    Sheila [name]  FMW:[id number] DOB: [birth year] DOA: 04/30/2024 PCP: [name], MD   Brief Narrative: Sheila [name] is a 74 y.o. female with a history of diabetes mellitus type 2, chronic leukocytosis, CKD stage IIIa, arthritis, obesity.  Patient presented secondary to left hand redness and found to have evidence of cellulitis. Septic joint ruled out. Patient managed on antibiotics with improving symptoms.   Assessment and Plan:  Left hand cellulitis Patient started on Vancomycin  and Unasyn  empirically on admission. Imaging without evidence of osteomyelitis, although MRI was severely limited. CT significant for no evidence of osteomyelitis or abscess. Vancomycin  transitioned to doxycycline  and Unasyn  transitioned to Augmentin . - Continue Augmentin  and doxycycline   Chronic leukocytosis Baseline WBC appears to be around 16,000 - 17,000. Stable.  Left knee gout Left knee effusion Concern for possible gout flare. X-ray obtained and is significant for moderate knee effusion. Patient was started on colchicine . Orthopedic surgery consulted and performed an arthrocentesis with injection of marcaine  and kenalog  on 1/17. Fluid analysis confirms gout. - Continue colchicine  until symptoms improved - Voltaren  gel  Hypokalemia Resolved with potassium supplementation.  Diabetes mellitus type 2 Poorly controlled based on hemoglobin A1C of 8.5%. Prior to arrival medication(s) include Jardiance, Tresiba and Humalog. - Continue Semglee  and SSI  CKD stage IIIa Baseline creatinine of about 1.3. stable.  Obesity, class II Estimated body mass index is 36.38 kg/m (pended) as calculated from the following:   Height as of this encounter: (P) 5' (1.524 m).   Weight as of this encounter: 84.5 kg.   DVT prophylaxis: Lovenox  Code Status:   Code Status: Full Code Family Communication: None at bedside Disposition Plan: Discharge to SNF pending bed availability vs home if ability to  ambulation improves   Consultants:  Orthopedic surgery  Procedures:  None  Antimicrobials: Vancomycin  Unasyn  Augmentin  Doxycycline     Subjective: No events from overnight.  Objective: BP 136/64 (BP Location: Right Arm)   Pulse 62   Temp 98 F (36.7 C) (Oral)   Resp 16   Ht (P) 5' (1.524 m)   Wt 84.5 kg   SpO2 99%   BMI (P) 36.38 kg/m   Examination:  General exam: Appears calm and comfortable.   Data Reviewed: I have personally reviewed following labs and imaging studies  CBC Lab Results  Component Value Date   WBC 16.8 (H) 05/06/2024   RBC 3.91 05/06/2024   HGB 11.0 (L) 05/06/2024   HCT 35.0 (L) 05/06/2024   MCV 89.5 05/06/2024   MCH 28.1 05/06/2024   PLT 449 (H) 05/06/2024   MCHC 31.4 05/06/2024   RDW 14.5 05/06/2024   LYMPHSABS 3.7 05/01/2024   MONOABS 1.5 (H) 05/01/2024   EOSABS 0.2 05/01/2024   BASOSABS 0.1 05/01/2024     Last metabolic panel Lab Results  Component Value Date   NA 133 (L) 05/05/2024   K 3.9 05/05/2024   CL 100 05/05/2024   CO2 21 (L) 05/05/2024   BUN 25 (H) 05/05/2024   CREATININE 1.18 (H) 05/05/2024   GLUCOSE 240 (H) 05/05/2024   GFRNONAA 49 (L) 05/05/2024   CALCIUM 8.7 (L) 05/05/2024   PROT 7.9 05/01/2024   ALBUMIN 3.6 05/01/2024   BILITOT 0.5 05/01/2024   ALKPHOS 194 (H) 05/01/2024   AST 23 05/01/2024   ALT 16 05/01/2024   ANIONGAP 12 05/05/2024    GFR: Estimated Creatinine Clearance: 42.2 mL/min (A) (by C-G formula based on SCr of 1.18 mg/dL (H)).  Recent Results (from  the past 240 hours)  Blood Culture (routine x 2)     Status: None   Collection Time: 04/30/24  4:45 PM   Specimen: BLOOD RIGHT HAND  Result Value Ref Range Status   Specimen Description BLOOD RIGHT HAND  Final   Special Requests   Final    BOTTLES DRAWN AEROBIC AND ANAEROBIC Blood Culture adequate volume   Culture   Final    NO GROWTH 5 DAYS Performed at [facility], [address], [place], [place] [address]    Report Status  05/05/2024 FINAL  Final  Blood Culture (routine x 2)     Status: None   Collection Time: 04/30/24  5:01 PM   Specimen: BLOOD  Result Value Ref Range Status   Specimen Description BLOOD SITE NOT SPECIFIED  Final   Special Requests   Final    BOTTLES DRAWN AEROBIC ONLY Blood Culture results may not be optimal due to an inadequate volume of blood received in culture bottles   Culture   Final    NO GROWTH 5 DAYS Performed at [facility], [address] [place], [place] [address]    Report Status 05/05/2024 FINAL  Final  Anaerobic culture w Gram Stain     Status: None (Preliminary result)   Collection Time: 05/05/24  2:58 PM   Specimen: Synovium; Synovial Fluid  Result Value Ref Range Status   Specimen Description SYNOVIAL  Final   Special Requests LEFT KNEE  Final   Gram Stain   Final    RARE WBC PRESENT, PREDOMINANTLY PMN NO ORGANISMS SEEN Performed at [facility], [address], [place], [place] [address]    Culture PENDING  Incomplete   Report Status PENDING  Incomplete      Radiology Studies: No results found.     LOS: 6 days    Elgin Lam, MD [facility] 05/07/2024, 11:07 AM   If 7PM-7AM, please contact night-coverage [email or web address]  "

## 2024-05-07 NOTE — TOC Transition Note (Signed)
 Transition of Care [facility]) - Discharge Note   Patient Details  Name: Sheila [name] MRN: [id number] Date of Birth: 07/13/1950  Transition of Care [facility] CM/SW Contact:  Roxie KANDICE Stain, RN Phone Number: 05/07/2024, 3:43 PM   Clinical Narrative:     Patient stable for discharge. Patient is to go home with home health. This RNCM offered choice for Home Health, Teruko Joswick states she has no preference, RNCM made referral to [facility] with [facility], He is able to take referral.  Bedside commode ordered from [employer]. Family will transport home. Address, Phone number and PCP verified.    Final next level of care: Home w Home Health Services Barriers to Discharge: Barriers Resolved   Patient Goals and CMS Choice Patient states their goals for this hospitalization and ongoing recovery are:: Wants to go home CMS Medicare.gov Compare Post Acute Care list provided to:: Patient Choice offered to / list presented to : Patient      Discharge Placement               Home        Discharge Plan and Services Additional resources added to the After Visit Summary for   In-house Referral: Clinical Social Work              DME Arranged: Bedside commode DME Agency: [employer] Date DME Agency Contacted: 05/07/24 Time DME Agency Contacted: [phone] Representative spoke with at DME Agency: [place] HH Arranged: OT, PT HH Agency: [facility] Date [facility] Agency Contacted: 05/07/24 Time HH Agency Contacted: [phone] Representative spoke with at [facility] Agency: [place]  Social Drivers of Health (SDOH) Interventions SDOH Screenings   Food Insecurity: No Food Insecurity (05/01/2024)  Housing: Low Risk (05/01/2024)  Transportation Needs: No Transportation Needs (05/01/2024)  Utilities: Not At Risk (05/01/2024)  Social Connections: Socially Isolated (05/01/2024)  Tobacco Use: Medium Risk (05/01/2024)     Readmission Risk Interventions     No data to display

## 2024-05-10 LAB — ANAEROBIC CULTURE W GRAM STAIN

## 2024-07-17 ENCOUNTER — Ambulatory Visit: Admitting: Podiatry
